# Patient Record
Sex: Female | Born: 1960
Health system: Southern US, Community
[De-identification: ages and names within clinical notes are randomized; demographics above are authoritative.]

## PROBLEM LIST (undated history)

## (undated) DIAGNOSIS — K259 Gastric ulcer, unspecified as acute or chronic, without hemorrhage or perforation: Secondary | ICD-10-CM

## (undated) DIAGNOSIS — F5104 Psychophysiologic insomnia: Secondary | ICD-10-CM

## (undated) DIAGNOSIS — G459 Transient cerebral ischemic attack, unspecified: Secondary | ICD-10-CM

## (undated) DIAGNOSIS — M797 Fibromyalgia: Secondary | ICD-10-CM

## (undated) DIAGNOSIS — R519 Headache, unspecified: Secondary | ICD-10-CM

## (undated) DIAGNOSIS — R251 Tremor, unspecified: Secondary | ICD-10-CM

## (undated) DIAGNOSIS — J45909 Unspecified asthma, uncomplicated: Secondary | ICD-10-CM

## (undated) DIAGNOSIS — I10 Essential (primary) hypertension: Secondary | ICD-10-CM

## (undated) DIAGNOSIS — E538 Deficiency of other specified B group vitamins: Secondary | ICD-10-CM

## (undated) DIAGNOSIS — D649 Anemia, unspecified: Secondary | ICD-10-CM

## (undated) DIAGNOSIS — J449 Chronic obstructive pulmonary disease, unspecified: Secondary | ICD-10-CM

## (undated) DIAGNOSIS — R413 Other amnesia: Secondary | ICD-10-CM

## (undated) DIAGNOSIS — J189 Pneumonia, unspecified organism: Secondary | ICD-10-CM

## (undated) DIAGNOSIS — R569 Unspecified convulsions: Secondary | ICD-10-CM

## (undated) DIAGNOSIS — F513 Sleepwalking [somnambulism]: Secondary | ICD-10-CM

## (undated) DIAGNOSIS — K219 Gastro-esophageal reflux disease without esophagitis: Secondary | ICD-10-CM

## (undated) DIAGNOSIS — Z87442 Personal history of urinary calculi: Secondary | ICD-10-CM

## (undated) DIAGNOSIS — I671 Cerebral aneurysm, nonruptured: Secondary | ICD-10-CM

## (undated) DIAGNOSIS — R269 Unspecified abnormalities of gait and mobility: Secondary | ICD-10-CM

## (undated) DIAGNOSIS — C539 Malignant neoplasm of cervix uteri, unspecified: Secondary | ICD-10-CM

## (undated) DIAGNOSIS — R51 Headache: Secondary | ICD-10-CM

## (undated) HISTORY — DX: Unspecified abnormalities of gait and mobility: R26.9

## (undated) HISTORY — DX: Gastro-esophageal reflux disease without esophagitis: K21.9

## (undated) HISTORY — DX: Malignant neoplasm of cervix uteri, unspecified: C53.9

## (undated) HISTORY — DX: Gastric ulcer, unspecified as acute or chronic, without hemorrhage or perforation: K25.9

## (undated) HISTORY — DX: Psychophysiologic insomnia: F51.04

## (undated) HISTORY — DX: Fibromyalgia: M79.7

## (undated) HISTORY — PX: COLONOSCOPY: SHX174

## (undated) HISTORY — PX: APPENDECTOMY: SHX54

## (undated) HISTORY — PX: SALPINGOOPHORECTOMY: SHX82

## (undated) HISTORY — PX: CHOLECYSTECTOMY: SHX55

## (undated) HISTORY — DX: Deficiency of other specified B group vitamins: E53.8

## (undated) HISTORY — PX: TOTAL ABDOMINAL HYSTERECTOMY: SHX209

## (undated) HISTORY — DX: Sleepwalking (somnambulism): F51.3

## (undated) HISTORY — DX: Cerebral aneurysm, nonruptured: I67.1

## (undated) HISTORY — DX: Pneumonia, unspecified organism: J18.9

## (undated) HISTORY — DX: Other amnesia: R41.3

## (undated) HISTORY — DX: Chronic obstructive pulmonary disease, unspecified: J44.9

---

## 2000-10-27 ENCOUNTER — Inpatient Hospital Stay (HOSPITAL_COMMUNITY): Admission: AD | Admit: 2000-10-27 | Discharge: 2000-10-30 | Payer: Self-pay | Admitting: Internal Medicine

## 2000-10-28 ENCOUNTER — Encounter: Payer: Self-pay | Admitting: Internal Medicine

## 2000-10-29 ENCOUNTER — Encounter: Payer: Self-pay | Admitting: Cardiology

## 2000-10-29 ENCOUNTER — Encounter: Payer: Self-pay | Admitting: Internal Medicine

## 2000-10-30 ENCOUNTER — Encounter: Payer: Self-pay | Admitting: Internal Medicine

## 2013-11-08 ENCOUNTER — Other Ambulatory Visit (HOSPITAL_COMMUNITY): Payer: Self-pay | Admitting: Unknown Physician Specialty

## 2013-11-09 ENCOUNTER — Other Ambulatory Visit (HOSPITAL_COMMUNITY): Payer: Self-pay | Admitting: Unknown Physician Specialty

## 2013-11-09 DIAGNOSIS — F17200 Nicotine dependence, unspecified, uncomplicated: Secondary | ICD-10-CM

## 2013-11-18 ENCOUNTER — Telehealth: Payer: Self-pay | Admitting: *Deleted

## 2013-11-18 ENCOUNTER — Ambulatory Visit (HOSPITAL_COMMUNITY)
Admission: RE | Admit: 2013-11-18 | Discharge: 2013-11-18 | Disposition: A | Discharge status: Home / Self Care | Payer: BC Managed Care – PPO | Source: Ambulatory Visit | Attending: Unknown Physician Specialty | Admitting: Unknown Physician Specialty

## 2013-11-18 DIAGNOSIS — Z87891 Personal history of nicotine dependence: Secondary | ICD-10-CM | POA: Insufficient documentation

## 2013-11-18 DIAGNOSIS — R05 Cough: Secondary | ICD-10-CM | POA: Insufficient documentation

## 2013-11-18 DIAGNOSIS — I2510 Atherosclerotic heart disease of native coronary artery without angina pectoris: Secondary | ICD-10-CM | POA: Insufficient documentation

## 2013-11-18 DIAGNOSIS — R634 Abnormal weight loss: Secondary | ICD-10-CM | POA: Insufficient documentation

## 2013-11-18 DIAGNOSIS — R059 Cough, unspecified: Secondary | ICD-10-CM | POA: Insufficient documentation

## 2013-11-18 DIAGNOSIS — M549 Dorsalgia, unspecified: Secondary | ICD-10-CM | POA: Insufficient documentation

## 2013-11-18 DIAGNOSIS — F17200 Nicotine dependence, unspecified, uncomplicated: Secondary | ICD-10-CM

## 2013-11-18 NOTE — Telephone Encounter (Signed)
Left message for pt to return my call so I can schedule a genetic appt.  

## 2013-11-22 ENCOUNTER — Telehealth: Payer: Self-pay | Admitting: *Deleted

## 2013-11-22 NOTE — Telephone Encounter (Signed)
Called and spoke with patient and confirmed genetic counseling appointment for 01/14/14 at 1230. Contacted Duwayne Heck at [facility] to make her aware that the appointment has been scheduled.

## 2014-01-14 ENCOUNTER — Other Ambulatory Visit: Payer: BC Managed Care – PPO

## 2014-01-14 ENCOUNTER — Ambulatory Visit (HOSPITAL_BASED_OUTPATIENT_CLINIC_OR_DEPARTMENT_OTHER): Payer: BC Managed Care – PPO | Admitting: Genetic Counselor

## 2014-01-14 DIAGNOSIS — Z800 Family history of malignant neoplasm of digestive organs: Secondary | ICD-10-CM

## 2014-01-14 DIAGNOSIS — Z803 Family history of malignant neoplasm of breast: Secondary | ICD-10-CM

## 2014-01-14 DIAGNOSIS — C539 Malignant neoplasm of cervix uteri, unspecified: Secondary | ICD-10-CM

## 2014-01-14 NOTE — Progress Notes (Signed)
HISTORY OF PRESENT ILLNESS: Dr. Sherrie Sport requested a cancer genetics consultation for Brandy Gutierrez, a 53 y.o. female, due to a personal and family history of cancer.  Brandy Gutierrez presents to clinic today to discuss the possibility of a hereditary predisposition to cancer, genetic testing, and to further clarify her future cancer risks, as well as potential cancer risk for family members.   Past Medical History  Diagnosis Date   Cervical cancer     dx at age 3    Past Surgical History  Procedure Laterality Date   Total abdominal hysterectomy      age 30 for cervical cancer   Salpingoophorectomy      age 35 for cervical cancer    HORMONAL RISK FACTORS: First live birth at age 69  OCP use: approximately 10 years HRT use: approximately >20 years Colonoscopy: yes; no polyps per patient UTD mammogram: yes Number of breast biopsies: 1 UTD pelvic exam:  yes    FAMILY HISTORY:  During the visit, a 4-generation pedigree was obtained. Significant diagnoses include the following:  Family History  Problem Relation Age of Onset   Cancer Mother 74    breast   Cancer Maternal Aunt 35    breast   Cancer Maternal Uncle 41    colon   Cancer Maternal Grandmother 48    breast   Cancer Maternal Grandfather 65    throat - smoker   Cancer Maternal Aunt 40    unknown type of cancer   Cancer Maternal Uncle 68    GI cancer (? pancreatic)   Cancer Maternal Aunt 55    breast   Cancer Cousin 22    breast   Cancer Cousin 20    breast    Brandy Gutierrez's ancestry is of Caucasian descent. There is no known Jewish ancestry or consanguinity.  GENETIC COUNSELING ASSESSMENT: Brandy Gutierrez is a 53 y.o. female with a family history of cancer suggestive of a hereditary predisposition to cancer. We, therefore, discussed and recommended the following at today's visit.   DISCUSSION: We reviewed the characteristics, features and inheritance patterns of hereditary cancer syndromes. We  also discussed genetic testing, including the appropriate family members to test, the process of testing, insurance coverage and turn-around-time for results. We discussed the implications of a negative, positive and/or variant of uncertain significant result. We recommended Ms. Crisanti pursue genetic testing for the BreastNext gene panel.   PLAN: Based on our above recommendation, Brandy Gutierrez wished to pursue genetic testing and the blood sample was drawn and will be sent to [facility] for analysis of the BreastNext gene panel. Results should be available within approximately 6 weeks time, at which point they will be disclosed by telephone to [name], as will any additional recommendations warranted by these results. We encouraged Brandy Gutierrez to remain in contact with cancer genetics annually so that we can continuously update the family history and inform her of any changes in cancer genetics and testing that may be of benefit for this family. Ms.  Gutierrez questions were answered to her satisfaction today. Our contact information was provided should additional questions or concerns arise.   Thank you for the referral and allowing [place] to share in the care of your patient.   The patient was seen for a total of 40 minutes, greater than 50% of which was spent face-to-face counseling.  This patient was discussed with Magrinat who agrees with the above.    _______________________________________________________________________ For Office Staff:  Number  of people involved in session: 2 Was an Intern/ student involved with case: no

## 2014-02-10 ENCOUNTER — Encounter: Payer: Self-pay | Admitting: Genetic Counselor

## 2014-02-10 DIAGNOSIS — Z803 Family history of malignant neoplasm of breast: Secondary | ICD-10-CM

## 2014-02-10 DIAGNOSIS — Z800 Family history of malignant neoplasm of digestive organs: Secondary | ICD-10-CM

## 2014-02-10 NOTE — Progress Notes (Signed)
HPI:  [name] was previously seen in the [place] clinic due to a family history of cancer and concerns regarding a hereditary predisposition to cancer. Please refer to our prior cancer genetics clinic note for more information regarding [name]'s medical, social and family histories, and our assessment and recommendations, at the time. [name] recent genetic test results were disclosed to her, as were recommendations warranted by these results. These results and recommendations are discussed in more detail below.  GENETIC TEST RESULTS: At the time of [name]'s visit, we recommended she pursue genetic testing of the BreastNext gene panel. This test, which included sequencing and deletion/duplication analysis of the genes listed on the test report, was performed at [facility]. Genetic testing was normal, and did not reveal a mutation in these genes. A complete list of all genes tested is located on the test report scanned into [employer].    We discussed with [name] that since the current genetic testing is not perfect, it is possible there may be a gene mutation in one of these genes that current testing cannot detect, but that chance is small.  We also discussed, that it is possible that another gene that has not yet been discovered, or that we have not yet tested, is responsible for the cancer diagnoses in the family, and it is, therefore, important to remain in touch with cancer genetics in the future so that we can continue to offer [name] the most up to date genetic testing.   CANCER SCREENING RECOMMENDATIONS: This normal result is reassuring and indicates that [name] does not likely have an increased risk of cancer due to a a mutation in one of these genes.  We, therefore, recommended  [name] continue to follow the cancer screening guidelines provided by her primary healthcare providers.   RECOMMENDATIONS FOR FAMILY  MEMBERS:   While these results are reassuring for [name], this test does not tell us anything about [name]'s siblings or maternal relatives risks. We recommended these relatives also have genetic counseling and testing. Please let us know if we can help facilitate testing. Genetic counselors can be located in other cities, by visiting the website of the [employer] (ArtistMovie.se) and Field seismologist for a Dietitian by zip code.    FOLLOW-UP: Lastly, we discussed with [name] that cancer genetics is a rapidly advancing field and it is possible that new genetic tests will be appropriate for her and/or her family members in the future. We encouraged her to remain in contact with cancer genetics on an annual basis so we can update her personal and family histories and let her know of advances in cancer genetics that may benefit this family.   Our contact number was provided. [name] questions were answered to her satisfaction, and she knows she is welcome to call us at anytime with additional questions or concerns. This patient was discussed with Dr. Jana Hakim who agrees with the above.   Catherine A. Fine, MS, CGC Certified Genetic Counseor phone: [phone] [email or web address]

## 2014-04-07 ENCOUNTER — Encounter: Payer: Self-pay | Admitting: Neurology

## 2014-04-08 ENCOUNTER — Encounter: Payer: Self-pay | Admitting: Neurology

## 2014-04-08 ENCOUNTER — Ambulatory Visit (INDEPENDENT_AMBULATORY_CARE_PROVIDER_SITE_OTHER): Payer: BC Managed Care – PPO | Admitting: Neurology

## 2014-04-08 VITALS — BP 136/91 | HR 85 | Ht 64.5 in | Wt 103.0 lb

## 2014-04-08 DIAGNOSIS — G63 Polyneuropathy in diseases classified elsewhere: Secondary | ICD-10-CM

## 2014-04-08 DIAGNOSIS — IMO0001 Reserved for inherently not codable concepts without codable children: Secondary | ICD-10-CM

## 2014-04-08 DIAGNOSIS — M797 Fibromyalgia: Secondary | ICD-10-CM

## 2014-04-08 DIAGNOSIS — R413 Other amnesia: Secondary | ICD-10-CM

## 2014-04-08 HISTORY — DX: Other amnesia: R41.3

## 2014-04-08 MED ORDER — SUVOREXANT 15 MG PO TABS: 15.0000 mg | ORAL_TABLET | Freq: Every evening | ORAL | Status: DC | PRN

## 2014-04-08 NOTE — Progress Notes (Signed)
Reason for visit: Memory disorder  Brandy Gutierrez is a 53 y.o. female  History of present illness:  Brandy Gutierrez is a 53 year old right-handed white female with a history of fibromyalgia. The patient indicates that she has ongoing chronic pain, and she has had problems with chronic insomnia associated with the pain. The patient indicates that she only gets 2 or 3 hours of sleep at night. The patient has had a parallel between her cognitive functioning abilities and her fatigue. The patient reports that her problems with memory and concentration have declined over the last 2 or 3 months, and the level of fatigue has significantly increased. The patient indicates that she is having problems with functioning at work, and she works as a Radiation protection practitioner, and she has had problems managing the money. The patient also has noted a 2 or 3 year history of numbness and tingling sensations and burning sensations in the feet. She has been noted to have a vitamin B12 deficiencies, and she has been on B12 injections. The numbness sensations in the feet have worsened, however over the last several months. She has noted some changes in balance, with staggering. The patient denies any falls. A recent MRI of the brain shows mild small vessel disease in the left greater than right deep white matter. The patient is on low-dose aspirin, but she continues to smoke. The patient does report some ongoing problems with constipation, but no problems controlling the bowels or the bladder. The patient indicates that she did undergo a carotid Doppler study, and her impression was that this was unremarkable. She is sent to this office for an evaluation. The patient indicates that at nighttime she will oftentimes walk in her sleep.  Past Medical History  Diagnosis Date  . Cervical cancer     dx at age 44  . Fibromyalgia   . Memory disorder 04/08/2014  . Vitamin B12 deficiency   . Somnambulism   . Chronic insomnia     Past  Surgical History  Procedure Laterality Date  . Total abdominal hysterectomy      age 89 for cervical cancer  . Salpingoophorectomy      age 33 for cervical cancer    Family History  Problem Relation Age of Onset  . Cancer Mother 104    breast  . Cancer Maternal Aunt 70    breast  . Cancer Maternal Uncle 45    colon  . Cancer Maternal Grandmother 46    breast  . Stroke Maternal Grandmother   . Cancer Maternal Grandfather 65    throat - smoker  . Cancer Maternal Aunt 40    unknown type of cancer  . Cancer Maternal Uncle 68    GI cancer (? pancreatic)  . Cancer Maternal Aunt 38    breast  . Cancer Cousin 22    breast  . Cancer Cousin 69    breast  . Cancer Father   . Hypertension Sister   . Hypertension Brother     Social history:  reports that she has been smoking Cigarettes.  She has a 28 pack-year smoking history. She has never used smokeless tobacco. She reports that she drinks alcohol. She reports that she does not use illicit drugs.  Medications:  Current Outpatient Prescriptions on File Prior to Visit  Medication Sig Dispense Refill  . ALPRAZolam (XANAX) 0.5 MG tablet Take 0.5 mg by mouth 2 (two) times daily as needed.       Marland Kitchen aspirin 81 MG  tablet Take 81 mg by mouth daily.      . cyanocobalamin (,VITAMIN B-12,) 1000 MCG/ML injection Inject 1,000 mcg into the muscle every 30 (thirty) days.      . fentaNYL (DURAGESIC - DOSED MCG/HR) 50 MCG/HR Place 50 mcg onto the skin every 3 (three) days.      . fexofenadine (ALLEGRA ALLERGY) 180 MG tablet Take 180 mg by mouth daily.      Marland Kitchen gabapentin (NEURONTIN) 600 MG tablet Take 600 mg by mouth 4 (four) times daily.      . hydrochlorothiazide (HYDRODIURIL) 25 MG tablet Take 25 mg by mouth daily.      Marland Kitchen oxyCODONE-acetaminophen (PERCOCET) 7.5-325 MG per tablet Take 1 tablet by mouth every 8 (eight) hours as needed for pain.       No current facility-administered medications on file prior to visit.      Allergies  Allergen  Reactions  . Sulfa Antibiotics     ROS:  Out of a complete 14 system review of symptoms, the patient complains only of the following symptoms, and all other reviewed systems are negative.  Fevers, chills Blurred vision Constipation Joint pain, joint swelling, muscle cramps, achy muscles Confusion, headache, numbness, weakness, dizziness Insomnia, decreased energy, disinterest in activities, sleepwalking  Blood pressure 136/91, pulse 85, height 5' 4.5" (1.638 m), weight 103 lb (46.72 kg).  Physical Exam  General: The patient is alert and cooperative at the time of the examination.  Eyes: Pupils are anisocoric, the right pupil is 5 mm, left pupil 4 mm, both reactive to light. No ptosis is seen. Discs are flat bilaterally.  Neck: The neck is supple, no carotid bruits are noted.  Respiratory: The respiratory examination is clear.  Cardiovascular: The cardiovascular examination reveals a regular rate and rhythm, no obvious murmurs or rubs are noted.  Skin: Extremities are without significant edema.  Neurologic Exam  Mental status: The patient is alert and oriented x 3 at the time of the examination. The patient has apparent normal recent and remote memory, with an apparently normal attention span and concentration ability. Mini-Mental status examination done today shows a total score of 30/30.  Cranial nerves: Facial symmetry is present. There is good sensation of the face to pinprick and soft touch bilaterally. The strength of the facial muscles and the muscles to head turning and shoulder shrug are normal bilaterally. Speech is well enunciated, no aphasia or dysarthria is noted. Extraocular movements are full. Visual fields are full. The tongue is midline, and the patient has symmetric elevation of the soft palate. No obvious hearing deficits are noted.  Motor: The motor testing reveals 5 over 5 strength of all 4 extremities. Good symmetric motor tone is noted  throughout.  Sensory: Sensory testing is intact to pinprick, soft touch, vibration sensation, and position sense on the upper extremities. With the lower extremities, there is a stocking pattern pinprick sensory deficit up to the knees bilaterally, with moderate impairment of vibration sensation and mild impairment of position sensation in both feet. No evidence of extinction is noted.  Coordination: Cerebellar testing reveals good finger-nose-finger and heel-to-shin bilaterally.  Gait and station: Gait is normal. Tandem gait is normal. Romberg is negative. No drift is seen.  Reflexes: Deep tendon reflexes are symmetric and normal bilaterally, with the exception that there is a question of ankle jerk reflexes bilaterally. Toes are downgoing bilaterally.   Assessment/Plan:  1. Fibromyalgia  2. Chronic insomnia  3. Vitamin B12 deficiency  4. Probable peripheral neuropathy  5. Reported memory disturbance  6. Mild small vessel disease by MRI brain  The patient is to remain on low-dose aspirin, and I have suggested that she stop smoking. She is having significant issues with pain, and insomnia. This may be the primary etiology of her memory issues, as the fatigue and memory problems and concentration issues have paralleled one another. The patient will be placed on Belsomra for sleep, and she will undergo nerve conduction studies of both legs and one arm, EMG of 1 leg for an evaluation of a peripheral neuropathy. Blood work will be done today. She will followup for the EMG evaluation. The memory issue will need to be followed over time, and if this continues to progress, medications for memory may be added. Adequate treatment of her fibromyalgia pain may be important, and the use of Cymbalta in the future may be considered.   Jill Alexanders MD 04/08/2014 7:46 PM  [facility] [address] [place], [place] [address]  Phone [phone] Fax [phone]

## 2014-04-08 NOTE — Patient Instructions (Signed)
Fibromyalgia Fibromyalgia is a disorder that is often misunderstood. It is associated with muscular pains and tenderness that comes and goes. It is often associated with fatigue and sleep disturbances. Though it tends to be long-lasting, fibromyalgia is not life-threatening. CAUSES  The exact cause of fibromyalgia is unknown. People with certain gene types are predisposed to developing fibromyalgia and other conditions. Certain factors can play a role as triggers, such as:  Spine disorders.  Arthritis.  Severe injury (trauma) and other physical stressors.  Emotional stressors. SYMPTOMS   The main symptom is pain and stiffness in the muscles and joints, which can vary over time.  Sleep and fatigue problems. Other related symptoms may include:  Bowel and bladder problems.  Headaches.  Visual problems.  Problems with odors and noises.  Depression or mood changes.  Painful periods (dysmenorrhea).  Dryness of the skin or eyes. DIAGNOSIS  There are no specific tests for diagnosing fibromyalgia. Patients can be diagnosed accurately from the specific symptoms they have. The diagnosis is made by determining that nothing else is causing the problems. TREATMENT  There is no cure. Management includes medicines and an active, healthy lifestyle. The goal is to enhance physical fitness, decrease pain, and improve sleep. HOME CARE INSTRUCTIONS   Only take over-the-counter or prescription medicines as directed by your caregiver. Sleeping pills, tranquilizers, and pain medicines may make your problems worse.  Low-impact aerobic exercise is very important and advised for treatment. At first, it may seem to make pain worse. Gradually increasing your tolerance will overcome this feeling.  Learning relaxation techniques and how to control stress will help you. Biofeedback, visual imagery, hypnosis, muscle relaxation, yoga, and meditation are all options.  Anti-inflammatory medicines and  physical therapy may provide short-term help.  Acupuncture or massage treatments may help.  Take muscle relaxant medicines as suggested by your caregiver.  Avoid stressful situations.  Plan a healthy lifestyle. This includes your diet, sleep, rest, exercise, and friends.  Find and practice a hobby you enjoy.  Join a fibromyalgia support group for interaction, ideas, and sharing advice. This may be helpful. SEEK MEDICAL CARE IF:  You are not having good results or improvement from your treatment. FOR MORE INFORMATION  National Fibromyalgia Association: www.fmaware.org Arthritis Foundation: www.arthritis.org Document Released: 08/05/2005 Document Revised: 10/28/2011 Document Reviewed: 11/15/2009 ExitCare Patient Information 2015 [employer]. This information is not intended to replace advice given to you by your health care provider. Make sure you discuss any questions you have with your health care provider.  

## 2014-04-12 ENCOUNTER — Telehealth: Payer: Self-pay | Admitting: Neurology

## 2014-04-12 LAB — IFE AND PE, SERUM
ALBUMIN SERPL ELPH-MCNC: 4 g/dL (ref 3.2–5.6)
ALPHA 1: 0.3 g/dL (ref 0.1–0.4)
ALPHA2 GLOB SERPL ELPH-MCNC: 0.8 g/dL (ref 0.4–1.2)
Albumin/Glob SerPl: 1.3 (ref 0.7–2.0)
B-GLOBULIN SERPL ELPH-MCNC: 1 g/dL (ref 0.6–1.3)
Gamma Glob SerPl Elph-Mcnc: 1.2 g/dL (ref 0.5–1.6)
Globulin, Total: 3.3 g/dL (ref 2.0–4.5)
IgA/Immunoglobulin A, Serum: 241 mg/dL (ref 91–414)
IgG (Immunoglobin G), Serum: 978 mg/dL (ref 700–1600)
IgM (Immunoglobulin M), Srm: 197 mg/dL (ref 40–230)
TOTAL PROTEIN: 7.3 g/dL (ref 6.0–8.5)

## 2014-04-12 LAB — RHEUMATOID FACTOR: Rhuematoid fact SerPl-aCnc: 7.9 IU/mL (ref 0.0–13.9)

## 2014-04-12 LAB — ENA+DNA/DS+SJORGEN'S
DSDNA AB: 2 [IU]/mL (ref 0–9)
ENA RNP AB: 6.9 AI — AB (ref 0.0–0.9)
ENA SM Ab Ser-aCnc: <0.2 AI (ref 0.0–0.9)
ENA SSA (RO) Ab: <0.2 AI (ref 0.0–0.9)

## 2014-04-12 LAB — SEDIMENTATION RATE: Sed Rate: 2 mm/hr (ref 0–40)

## 2014-04-12 LAB — HIV ANTIBODY (ROUTINE TESTING W REFLEX): HIV 1/HIV 2 AB: NONREACTIVE

## 2014-04-12 LAB — ANA W/REFLEX: Anti Nuclear Antibody(ANA): POSITIVE — AB

## 2014-04-12 LAB — ANGIOTENSIN CONVERTING ENZYME: ANGIO CONVERT ENZYME: 39 U/L (ref 14–82)

## 2014-04-12 LAB — RPR: SYPHILIS RPR SCR: NONREACTIVE

## 2014-04-12 LAB — COPPER, SERUM: COPPER: 138 ug/dL (ref 72–166)

## 2014-04-12 NOTE — Telephone Encounter (Signed)
I called patient. The blood work was unremarkable with exception of a positive ANA with an elevated RNP antibody. Unsure the clinical significance of this. May need to be followed in the future. I discussed this with the patient.

## 2014-05-02 ENCOUNTER — Encounter (INDEPENDENT_AMBULATORY_CARE_PROVIDER_SITE_OTHER): Payer: Self-pay

## 2014-05-02 ENCOUNTER — Ambulatory Visit (INDEPENDENT_AMBULATORY_CARE_PROVIDER_SITE_OTHER): Payer: BC Managed Care – PPO | Admitting: Neurology

## 2014-05-02 DIAGNOSIS — M797 Fibromyalgia: Secondary | ICD-10-CM

## 2014-05-02 DIAGNOSIS — Z0289 Encounter for other administrative examinations: Secondary | ICD-10-CM

## 2014-05-02 DIAGNOSIS — R413 Other amnesia: Secondary | ICD-10-CM

## 2014-05-02 DIAGNOSIS — IMO0001 Reserved for inherently not codable concepts without codable children: Secondary | ICD-10-CM

## 2014-05-02 DIAGNOSIS — G63 Polyneuropathy in diseases classified elsewhere: Secondary | ICD-10-CM

## 2014-05-02 DIAGNOSIS — R209 Unspecified disturbances of skin sensation: Secondary | ICD-10-CM

## 2014-05-02 MED ORDER — DULOXETINE HCL 30 MG PO CPEP: ORAL_CAPSULE | ORAL | Status: DC

## 2014-05-02 NOTE — Progress Notes (Signed)
Brandy Gutierrez is a 53 year old patient with a history of chronic low back pain, and some sensory dysesthesias in both feet, and more recently involving the left arm. The patient has some difficulty with sleeping at night secondary to the dysesthesias, and she is reporting some myoclonic jerks of the legs at night. She returns to the office for EMG and nerve conduction study evaluation.   Nerve conduction studies of the left arm and both legs were unremarkable. No definite peripheral he is seen. EMG evaluation of the right leg was normal.  The patient is having sensory dysesthesias on the left arm and both legs. MRI evaluation of the brain done previously showed minimal small vessel disease. She will have MRI of the cervical spine to exclude demyelinating disease as a source of her sensory symptoms. Blood work showed a positive RNP antibody, which will need to be followed.  The patient will be placed on Cymbalta, and she will followup in 3-4 months. MRI of the cervical spine will be done.

## 2014-05-02 NOTE — Procedures (Signed)
     HISTORY:  Brandy Gutierrez is a 53 year old patient with a history of paresthesias involving the feet, and more recently involving the left hand. The patient has chronic low back pain issues as well. She is being evaluated for possible neuropathy or a radiculopathy.  NERVE CONDUCTION STUDIES:  Nerve conduction studies were performed on the left upper extremity. The distal motor latencies and motor amplitudes for the median and ulnar nerves were within normal limits. The F wave latencies and nerve conduction velocities for these nerves were also normal. The sensory latencies for the median and ulnar nerves were normal.  Nerve conduction studies were performed on both lower extremities. The distal motor latencies and motor amplitudes for the peroneal and posterior tibial nerves were within normal limits. The nerve conduction velocities for these nerves were also normal. The H reflex latencies were normal. The sensory latencies for the peroneal nerves were within normal limits.   EMG STUDIES:  EMG study was performed on the right lower extremity:  The tibialis anterior muscle reveals 2 to 4K motor units with full recruitment. No fibrillations or positive waves were seen. The peroneus tertius muscle reveals 2 to 4K motor units with full recruitment. No fibrillations or positive waves were seen. The medial gastrocnemius muscle reveals 1 to 3K motor units with full recruitment. No fibrillations or positive waves were seen. The vastus lateralis muscle reveals 2 to 4K motor units with full recruitment. No fibrillations or positive waves were seen. The iliopsoas muscle reveals 2 to 4K motor units with full recruitment. No fibrillations or positive waves were seen. The biceps femoris muscle (long head) reveals 2 to 4K motor units with full recruitment. No fibrillations or positive waves were seen. The lumbosacral paraspinal muscles were tested at 3 levels, and revealed no abnormalities of  insertional activity at all 3 levels tested. There was good relaxation.   IMPRESSION:  Nerve conduction studies done on the left upper extremity and both lower extremities were within normal limits. No evidence of a peripheral neuropathy is seen. A small fiber neuropathy however, may be missed by a standard nerve conduction studies. Clinical correlation is required. EMG evaluation of the right lower extremity was unremarkable, without evidence of an overlying lumbosacral radiculopathy.  Jill Alexanders MD 05/02/2014 11:06 AM  [facility] [address] [place], [place] [address]  Phone [phone] Fax [phone]

## 2014-05-11 ENCOUNTER — Telehealth: Payer: Self-pay | Admitting: *Deleted

## 2014-05-11 ENCOUNTER — Ambulatory Visit (INDEPENDENT_AMBULATORY_CARE_PROVIDER_SITE_OTHER): Payer: BC Managed Care – PPO

## 2014-05-11 DIAGNOSIS — G63 Polyneuropathy in diseases classified elsewhere: Secondary | ICD-10-CM

## 2014-05-11 DIAGNOSIS — R413 Other amnesia: Secondary | ICD-10-CM

## 2014-05-11 DIAGNOSIS — R209 Unspecified disturbances of skin sensation: Secondary | ICD-10-CM

## 2014-05-11 DIAGNOSIS — IMO0001 Reserved for inherently not codable concepts without codable children: Secondary | ICD-10-CM

## 2014-05-11 DIAGNOSIS — M797 Fibromyalgia: Secondary | ICD-10-CM

## 2014-05-11 NOTE — Telephone Encounter (Signed)
CD in [place] for reading 05/11/2014.

## 2014-05-12 ENCOUNTER — Telehealth: Payer: Self-pay | Admitting: Neurology

## 2014-05-12 NOTE — Telephone Encounter (Signed)
I called patient. MRI the cervical spine shows some facet joint arthritis at the C6-7 level, no evidence of spinal cord lesions, no evidence of multiple sclerosis. I discussed this with the patient.   MRI cervical spine 05/12/2014:  Impression   Mildly abnormal MRI cervical spine (without) demonstrating: 1. At C6-7: uncovertebral joint hypertrophy and facet hypertrophy with  mild right foraminal stenosis. 2. No intrinsic or compressive spinal cord lesions.

## 2014-07-29 ENCOUNTER — Encounter: Payer: Self-pay | Admitting: Neurology

## 2014-07-29 ENCOUNTER — Ambulatory Visit (INDEPENDENT_AMBULATORY_CARE_PROVIDER_SITE_OTHER): Payer: BC Managed Care – PPO | Admitting: Neurology

## 2014-07-29 VITALS — BP 109/80 | HR 82 | Ht 66.0 in | Wt 102.6 lb

## 2014-07-29 DIAGNOSIS — R251 Tremor, unspecified: Secondary | ICD-10-CM

## 2014-07-29 DIAGNOSIS — M797 Fibromyalgia: Secondary | ICD-10-CM

## 2014-07-29 MED ORDER — DULOXETINE HCL 30 MG PO CPEP: ORAL_CAPSULE | ORAL | Status: DC

## 2014-07-29 MED ORDER — LANSOPRAZOLE 30 MG PO CPDR: 30.0000 mg | DELAYED_RELEASE_CAPSULE | Freq: Every day | ORAL | Status: DC

## 2014-07-29 NOTE — Progress Notes (Signed)
Reason for visit: Fibromyalgia  Brandy Gutierrez is an 53 y.o. female  History of present illness:  Brandy Gutierrez is a 53 year old right-handed white female with a history of fibromyalgia. The patient has been placed on Cymbalta with some modest benefit. She is tolerating the medication fairly well. She went in the hospital 3 weeks ago for pneumonia, and she required IV antibodies for 4 days in the hospital. The patient has begun having episodes of generalized tremors involving the arms and legs and head and neck that may come on spontaneously. The episodes may last 3-10 minutes, and the patient indicates that she cannot control the tremors. The tremors only occur while she is standing, or while sitting or lying down. She reports no episodes of syncope. She does not feel lightheaded, or anxious with the episodes. She denies any fevers or chills around the time of the episodes of tremor. She recently had a migraine headache. She may have some slight confusion. The patient has also recently quit smoking, and she is on E-cigarettes. The patient comes to this office for an evaluation.   Past Medical History  Diagnosis Date  . Cervical cancer     dx at age 14  . Fibromyalgia   . Memory disorder 04/08/2014  . Vitamin B12 deficiency   . Somnambulism   . Chronic insomnia   . Pneumonia   . COPD (chronic obstructive pulmonary disease)   . GERD (gastroesophageal reflux disease)     Past Surgical History  Procedure Laterality Date  . Total abdominal hysterectomy      age 26 for cervical cancer  . Salpingoophorectomy      age 35 for cervical cancer    Family History  Problem Relation Age of Onset  . Cancer Mother 27    breast  . Cancer Maternal Aunt 28    breast  . Cancer Maternal Uncle 58    colon  . Cancer Maternal Grandmother 51    breast  . Stroke Maternal Grandmother   . Cancer Maternal Grandfather 65    throat - smoker  . Cancer Maternal Aunt 40    unknown type of cancer    . Cancer Maternal Uncle 68    GI cancer (? pancreatic)  . Cancer Maternal Aunt 29    breast  . Cancer Cousin 22    breast  . Cancer Cousin 78    breast  . Cancer Father   . Hypertension Sister   . Hypertension Brother     Social history:  reports that she has been smoking Cigarettes.  She has a 28 pack-year smoking history. She has never used smokeless tobacco. She reports that she drinks alcohol. She reports that she does not use illicit drugs.    Allergies  Allergen Reactions  . Sulfa Antibiotics     Medications:  Current Outpatient Prescriptions on File Prior to Visit  Medication Sig Dispense Refill  . ALPRAZolam (XANAX) 0.5 MG tablet Take 0.5 mg by mouth 2 (two) times daily as needed.     Marland Kitchen aspirin 81 MG tablet Take 81 mg by mouth daily.    . cyanocobalamin (,VITAMIN B-12,) 1000 MCG/ML injection Inject 1,000 mcg into the muscle every 30 (thirty) days.    . fentaNYL (DURAGESIC - DOSED MCG/HR) 50 MCG/HR Place 50 mcg onto the skin every 3 (three) days.    . fexofenadine (ALLEGRA ALLERGY) 180 MG tablet Take 180 mg by mouth daily.    . folic acid (FOLVITE) 1 MG  tablet Take 1 mg by mouth daily.    Marland Kitchen gabapentin (NEURONTIN) 600 MG tablet Take 600 mg by mouth 4 (four) times daily.    . hydrochlorothiazide (HYDRODIURIL) 25 MG tablet Take 25 mg by mouth daily.    Marland Kitchen oxyCODONE-acetaminophen (PERCOCET) 7.5-325 MG per tablet Take 1 tablet by mouth every 8 (eight) hours as needed for pain.     No current facility-administered medications on file prior to visit.    ROS:  Out of a complete 14 system review of symptoms, the patient complains only of the following symptoms, and all other reviewed systems are negative.  Activity change, appetite change, chills, fever Ear pain, ringing in the ears Blurred vision Cold intolerance Insomnia, sleepwalking Walking difficulty Dizziness, headache, tremors  Blood pressure 109/80, pulse 82, height 5\' 6"  (1.676 m), weight 102 lb 9.6 oz (46.539  kg).   Blood pressure, left arm, standing is 409 systolic.  Physical Exam  General: The patient is alert and cooperative at the time of the examination.  Skin: No significant peripheral edema is noted.   Neurologic Exam  Mental status: The patient is oriented x 3.  Cranial nerves: Facial symmetry is present. Speech is normal, no aphasia or dysarthria is noted. Extraocular movements are full. Visual fields are full.  Motor: The patient has good strength in all 4 extremities.  Sensory examination: Soft touch sensation is symmetric on the face, arms, and legs.  Coordination: The patient has good finger-nose-finger and heel-to-shin bilaterally.  Gait and station: The patient has a normal gait. Tandem gait is slightly unsteady. Romberg is negative. No drift is seen.  Reflexes: Deep tendon reflexes are symmetric.   Assessment/Plan:  1. Fibromyalgia  2. Intermittent tremors, etiology unclear  The patient has gained some benefit with Cymbalta with the fibromyalgia. The Cymbalta will be increased taking 30 mg the morning, 60 mg in the evening. The patient has begun having some episodes of tremors that have occurred following the hospitalization for pneumonia. The etiology of these events are not clear. The patient will be sent for further blood work today. Previous blood work showed a positive RNP antibody. The tremor episodes are not consistent with seizures as there is no loss of conscious with the events. She will follow-up in 4-5 months. There is no evidence of orthostasis on clinical examination today.  Jill Alexanders MD 07/29/2014 6:22 PM  [facility] [address] [place], [place] [address]  Phone [phone] Fax [phone]

## 2014-07-29 NOTE — Patient Instructions (Signed)
Fibromyalgia Fibromyalgia is a disorder that is often misunderstood. It is associated with muscular pains and tenderness that comes and goes. It is often associated with fatigue and sleep disturbances. Though it tends to be long-lasting, fibromyalgia is not life-threatening. CAUSES  The exact cause of fibromyalgia is unknown. People with certain gene types are predisposed to developing fibromyalgia and other conditions. Certain factors can play a role as triggers, such as:  Spine disorders.  Arthritis.  Severe injury (trauma) and other physical stressors.  Emotional stressors. SYMPTOMS   The main symptom is pain and stiffness in the muscles and joints, which can vary over time.  Sleep and fatigue problems. Other related symptoms may include:  Bowel and bladder problems.  Headaches.  Visual problems.  Problems with odors and noises.  Depression or mood changes.  Painful periods (dysmenorrhea).  Dryness of the skin or eyes. DIAGNOSIS  There are no specific tests for diagnosing fibromyalgia. Patients can be diagnosed accurately from the specific symptoms they have. The diagnosis is made by determining that nothing else is causing the problems. TREATMENT  There is no cure. Management includes medicines and an active, healthy lifestyle. The goal is to enhance physical fitness, decrease pain, and improve sleep. HOME CARE INSTRUCTIONS   Only take over-the-counter or prescription medicines as directed by your caregiver. Sleeping pills, tranquilizers, and pain medicines may make your problems worse.  Low-impact aerobic exercise is very important and advised for treatment. At first, it may seem to make pain worse. Gradually increasing your tolerance will overcome this feeling.  Learning relaxation techniques and how to control stress will help you. Biofeedback, visual imagery, hypnosis, muscle relaxation, yoga, and meditation are all options.  Anti-inflammatory medicines and  physical therapy may provide short-term help.  Acupuncture or massage treatments may help.  Take muscle relaxant medicines as suggested by your caregiver.  Avoid stressful situations.  Plan a healthy lifestyle. This includes your diet, sleep, rest, exercise, and friends.  Find and practice a hobby you enjoy.  Join a fibromyalgia support group for interaction, ideas, and sharing advice. This may be helpful. SEEK MEDICAL CARE IF:  You are not having good results or improvement from your treatment. FOR MORE INFORMATION  National Fibromyalgia Association: www.fmaware.org Arthritis Foundation: www.arthritis.org Document Released: 08/05/2005 Document Revised: 10/28/2011 Document Reviewed: 11/15/2009 ExitCare Patient Information 2015 [employer]. This information is not intended to replace advice given to you by your health care provider. Make sure you discuss any questions you have with your health care provider.  

## 2014-07-30 LAB — CBC WITH DIFFERENTIAL
BASOS ABS: 0 10*3/uL (ref 0.0–0.2)
Basos: 1 %
EOS: 3 %
Eosinophils Absolute: 0.1 10*3/uL (ref 0.0–0.4)
HCT: 44.2 % (ref 34.0–46.6)
Hemoglobin: 14.9 g/dL (ref 11.1–15.9)
IMMATURE GRANS (ABS): 0 10*3/uL (ref 0.0–0.1)
IMMATURE GRANULOCYTES: 0 %
Lymphocytes Absolute: 1.6 10*3/uL (ref 0.7–3.1)
Lymphs: 36 %
MCH: 31.8 pg (ref 26.6–33.0)
MCHC: 33.7 g/dL (ref 31.5–35.7)
MCV: 94 fL (ref 79–97)
MONOCYTES: 14 %
Monocytes Absolute: 0.6 10*3/uL (ref 0.1–0.9)
NEUTROS PCT: 46 %
Neutrophils Absolute: 2.1 10*3/uL (ref 1.4–7.0)
PLATELETS: 163 10*3/uL (ref 150–379)
RBC: 4.68 x10E6/uL (ref 3.77–5.28)
RDW: 14.3 % (ref 12.3–15.4)
WBC: 4.5 10*3/uL (ref 3.4–10.8)

## 2014-07-30 LAB — ANA W/REFLEX: Anti Nuclear Antibody(ANA): POSITIVE — AB

## 2014-07-30 LAB — COMPREHENSIVE METABOLIC PANEL
ALBUMIN: 4 g/dL (ref 3.5–5.5)
ALK PHOS: 57 IU/L (ref 39–117)
ALT: 17 IU/L (ref 0–32)
AST: 29 IU/L (ref 0–40)
Albumin/Globulin Ratio: 1.7 (ref 1.1–2.5)
BUN/Creatinine Ratio: 16 (ref 9–23)
BUN: 15 mg/dL (ref 6–24)
CO2: 26 mmol/L (ref 18–29)
Calcium: 9.7 mg/dL (ref 8.7–10.2)
Chloride: 99 mmol/L (ref 97–108)
Creatinine, Ser: 0.94 mg/dL (ref 0.57–1.00)
GFR calc Af Amer: 81 mL/min/{1.73_m2} (ref >59)
GFR calc non Af Amer: 70 mL/min/{1.73_m2} (ref >59)
Globulin, Total: 2.4 g/dL (ref 1.5–4.5)
Glucose: 76 mg/dL (ref 65–99)
Potassium: 4.1 mmol/L (ref 3.5–5.2)
SODIUM: 141 mmol/L (ref 134–144)
Total Bilirubin: <0.2 mg/dL (ref 0.0–1.2)
Total Protein: 6.4 g/dL (ref 6.0–8.5)

## 2014-07-30 LAB — ENA+DNA/DS+SJORGEN'S
ENA RNP AB: 6.2 AI — AB (ref 0.0–0.9)
ENA SM Ab Ser-aCnc: <0.2 AI (ref 0.0–0.9)
ENA SSA (RO) Ab: <0.2 AI (ref 0.0–0.9)
dsDNA Ab: 1 IU/mL (ref 0–9)

## 2014-07-30 LAB — TSH: TSH: 2.05 u[IU]/mL (ref 0.450–4.500)

## 2014-08-01 ENCOUNTER — Telehealth: Payer: Self-pay | Admitting: Neurology

## 2014-08-01 DIAGNOSIS — R251 Tremor, unspecified: Secondary | ICD-10-CM

## 2014-08-01 NOTE — Telephone Encounter (Signed)
I called patient. The patient still having episodes of tremors. I will check an EEG study. Blood work was relatively unremarkable.

## 2014-08-02 ENCOUNTER — Telehealth: Payer: Self-pay | Admitting: *Deleted

## 2014-08-02 NOTE — Telephone Encounter (Signed)
Message left requesting a call back to schedule the EEG Dr. Jannifer Franklin has requested.

## 2014-08-09 ENCOUNTER — Ambulatory Visit (INDEPENDENT_AMBULATORY_CARE_PROVIDER_SITE_OTHER): Payer: BC Managed Care – PPO | Admitting: Diagnostic Neuroimaging

## 2014-08-09 DIAGNOSIS — R251 Tremor, unspecified: Secondary | ICD-10-CM

## 2014-08-09 NOTE — Procedures (Signed)
   [facility]  EEG (ELECTROENCEPHALOGRAM) REPORT   STUDY DATE: 08/09/14  PATIENT NAME: Brandy Gutierrez DOB: 1960-09-09 MRN: [id number]  ORDERING CLINICIAN: Kathrynn Ducking, MD   TECHNOLOGIST: Laretta Alstrom  TECHNIQUE: Electroencephalogram was recorded utilizing standard 10-20 system of lead placement and reformatted into average and bipolar montages.  RECORDING TIME: 33 minutes  ACTIVATION: hyperventilation and photic stimulation  CLINICAL INFORMATION: 52 year old female with generalized tremors.   FINDINGS: Background rhythms of 7-8 hertz and 20-30 microvolts. No focal, lateralizing, epileptiform activity or seizures are seen. Patient recorded in the awake and drowsy state. EKG channel shows regular rhythm of 84 beats per minute.  IMPRESSION:  Mild diffuse slowly noted. Can be seen with toxic/metabolic etiologies or with drowsiness. No focal, lateralizing, epileptiform activity or seizures are seen.     INTERPRETING PHYSICIAN:  Penni Bombard, MD Certified in Neurology, Neurophysiology and Neuroimaging  [facility] [address] [place], [place] [address] [phone]

## 2014-08-13 ENCOUNTER — Telehealth: Payer: Self-pay | Admitting: Neurology

## 2014-08-13 MED ORDER — CLONAZEPAM 0.5 MG PO TABS: 0.5000 mg | ORAL_TABLET | Freq: Two times a day (BID) | ORAL | Status: DC

## 2014-08-13 NOTE — Telephone Encounter (Signed)
  I called the patient. She is still having intermittent tremors. The EEG showed mild slowing, no irritability. I will try low dose clonazepam, the patient will stop the alprazolam.  EEG 08/09/14:  IMPRESSION:  Mild diffuse slowly noted. Can be seen with toxic/metabolic  etiologies or with drowsiness. No focal, lateralizing,  epileptiform activity or seizures are seen.

## 2014-12-07 ENCOUNTER — Ambulatory Visit (INDEPENDENT_AMBULATORY_CARE_PROVIDER_SITE_OTHER): Payer: BLUE CROSS/BLUE SHIELD | Admitting: Neurology

## 2014-12-07 ENCOUNTER — Encounter: Payer: Self-pay | Admitting: Neurology

## 2014-12-07 VITALS — BP 138/93 | HR 106 | Ht 66.0 in | Wt 105.6 lb

## 2014-12-07 DIAGNOSIS — R413 Other amnesia: Secondary | ICD-10-CM | POA: Diagnosis not present

## 2014-12-07 DIAGNOSIS — M797 Fibromyalgia: Secondary | ICD-10-CM | POA: Diagnosis not present

## 2014-12-07 DIAGNOSIS — R258 Other abnormal involuntary movements: Secondary | ICD-10-CM | POA: Diagnosis not present

## 2014-12-07 DIAGNOSIS — R251 Tremor, unspecified: Secondary | ICD-10-CM

## 2014-12-07 MED ORDER — NORTRIPTYLINE HCL 10 MG PO CAPS: ORAL_CAPSULE | ORAL | Status: DC

## 2014-12-07 NOTE — Progress Notes (Signed)
Reason for visit: Fibromyalgia  Brandy Gutierrez is an 54 y.o. female  History of present illness:  Brandy Gutierrez is a 54 year old right-handed white female with a history of fibromyalgia. The patient is on a fentanyl patch for this, and she also takes Percocet tablets if she needs it. The patient indicates that the pain level was not fully controlled with these medications. She is also on gabapentin, and she was started on Cymbalta in low dose, but she could not tolerate the Cymbalta. At the 30 mg dose, the Cymbalta helped her sleep, but at 60 mg, she became confused. She stopped the medication altogether. She has tremors affecting the arms intermittently, she indicates that this happens on a daily basis. EEG study showed mild diffuse slowing that could be consistent with drowsiness. No seizures were seen. She returns for an evaluation.  Past Medical History  Diagnosis Date  . Cervical cancer     dx at age 75  . Fibromyalgia   . Memory disorder 04/08/2014  . Vitamin B12 deficiency   . Somnambulism   . Chronic insomnia   . Pneumonia   . COPD (chronic obstructive pulmonary disease)   . GERD (gastroesophageal reflux disease)   . Stomach ulcer     Past Surgical History  Procedure Laterality Date  . Total abdominal hysterectomy      age 89 for cervical cancer  . Salpingoophorectomy      age 54 for cervical cancer    Family History  Problem Relation Age of Onset  . Cancer Mother 52    breast  . Cancer Maternal Aunt 50    breast  . Cancer Maternal Uncle 69    colon  . Cancer Maternal Grandmother 89    breast  . Stroke Maternal Grandmother   . Cancer Maternal Grandfather 65    throat - smoker  . Cancer Maternal Aunt 40    unknown type of cancer  . Cancer Maternal Uncle 68    GI cancer (? pancreatic)  . Cancer Maternal Aunt 2    breast  . Cancer Cousin 22    breast  . Cancer Cousin 48    breast  . Cancer Father   . Hypertension Sister   . Hypertension Brother       Social history:  reports that she quit smoking about 5 months ago. Her smoking use included Cigarettes. She has a 28 pack-year smoking history. She has never used smokeless tobacco. She reports that she drinks alcohol. She reports that she does not use illicit drugs.    Allergies  Allergen Reactions  . Cymbalta [Duloxetine Hcl]     Confusion  . Sulfa Antibiotics     Medications:  Prior to Admission medications   Medication Sig Start Date End Date Taking? Authorizing Provider  ALPRAZolam Duanne Moron) 0.5 MG tablet Take 0.5 mg by mouth 2 (two) times daily as needed.    Yes Historical Provider, MD  aspirin 81 MG tablet Take 81 mg by mouth daily.   Yes Historical Provider, MD  cyanocobalamin (,VITAMIN B-12,) 1000 MCG/ML injection Inject 1,000 mcg into the muscle every 30 (thirty) days.   Yes Historical Provider, MD  dexlansoprazole (DEXILANT) 60 MG capsule Take 60 mg by mouth 2 (two) times daily.   Yes Historical Provider, MD  fentaNYL (DURAGESIC - DOSED MCG/HR) 50 MCG/HR Place 50 mcg onto the skin every 3 (three) days.   Yes Historical Provider, MD  fexofenadine (ALLEGRA ALLERGY) 180 MG tablet Take 180 mg  by mouth daily.   Yes Historical Provider, MD  folic acid (FOLVITE) 1 MG tablet Take 1 mg by mouth daily. 03/31/14  Yes Historical Provider, MD  gabapentin (NEURONTIN) 600 MG tablet Take 600 mg by mouth 4 (four) times daily.   Yes Historical Provider, MD  hydrochlorothiazide (HYDRODIURIL) 25 MG tablet Take 25 mg by mouth daily.   Yes Historical Provider, MD  ondansetron (ZOFRAN-ODT) 4 MG disintegrating tablet Take 4 mg by mouth every 8 (eight) hours as needed for nausea or vomiting.   Yes Historical Provider, MD  oxyCODONE-acetaminophen (PERCOCET) 7.5-325 MG per tablet Take 1 tablet by mouth every 8 (eight) hours as needed for pain.   Yes Historical Provider, MD    ROS:  Out of a complete 14 system review of symptoms, the patient complains only of the following symptoms, and all other  reviewed systems are negative.  Frequent waking, sleepwalking Shoulder pain, back pain, achy muscles Headache  Blood pressure 138/93, pulse 106, height 5\' 6"  (1.676 m), weight 105 lb 9.6 oz (47.9 kg).  Physical Exam  General: The patient is alert and cooperative at the time of the examination.  Skin: No significant peripheral edema is noted.   Neurologic Exam  Mental status: The patient is alert and oriented x 3 at the time of the examination. The patient has apparent normal recent and remote memory, with an apparently normal attention span and concentration ability.   Cranial nerves: Facial symmetry is present. Speech is normal, no aphasia or dysarthria is noted. Extraocular movements are full. Visual fields are full.  Motor: The patient has good strength in all 4 extremities.  Sensory examination: Soft touch sensation is symmetric on the face, arms, and legs.  Coordination: The patient has good finger-nose-finger and heel-to-shin bilaterally. No tremor is seen.  Gait and station: The patient has a normal gait. Tandem gait is normal. Romberg is negative. No drift is seen.  Reflexes: Deep tendon reflexes are symmetric.   Assessment/Plan:  1. Fibromyalgia  2. Reported intermittent tremors  The patient will be placed on low-dose nortriptyline at nighttime for the fibromyalgia symptoms. She will go up to 20 mg at night. If she requires more medications, she will call our office. She will follow-up otherwise in 6 months. The tremors are intermittent, I see no evidence of tremor on the clinical examination today.  Brandy Alexanders MD 12/07/2014 7:13 PM  [facility] [address] [place], [place] [address]  Phone [phone] Fax [phone]

## 2014-12-07 NOTE — Patient Instructions (Signed)
Fibromyalgia Fibromyalgia is a disorder that is often misunderstood. It is associated with muscular pains and tenderness that comes and goes. It is often associated with fatigue and sleep disturbances. Though it tends to be long-lasting, fibromyalgia is not life-threatening. CAUSES  The exact cause of fibromyalgia is unknown. People with certain gene types are predisposed to developing fibromyalgia and other conditions. Certain factors can play a role as triggers, such as:  Spine disorders.  Arthritis.  Severe injury (trauma) and other physical stressors.  Emotional stressors. SYMPTOMS   The main symptom is pain and stiffness in the muscles and joints, which can vary over time.  Sleep and fatigue problems. Other related symptoms may include:  Bowel and bladder problems.  Headaches.  Visual problems.  Problems with odors and noises.  Depression or mood changes.  Painful periods (dysmenorrhea).  Dryness of the skin or eyes. DIAGNOSIS  There are no specific tests for diagnosing fibromyalgia. Patients can be diagnosed accurately from the specific symptoms they have. The diagnosis is made by determining that nothing else is causing the problems. TREATMENT  There is no cure. Management includes medicines and an active, healthy lifestyle. The goal is to enhance physical fitness, decrease pain, and improve sleep. HOME CARE INSTRUCTIONS   Only take over-the-counter or prescription medicines as directed by your caregiver. Sleeping pills, tranquilizers, and pain medicines may make your problems worse.  Low-impact aerobic exercise is very important and advised for treatment. At first, it may seem to make pain worse. Gradually increasing your tolerance will overcome this feeling.  Learning relaxation techniques and how to control stress will help you. Biofeedback, visual imagery, hypnosis, muscle relaxation, yoga, and meditation are all options.  Anti-inflammatory medicines and  physical therapy may provide short-term help.  Acupuncture or massage treatments may help.  Take muscle relaxant medicines as suggested by your caregiver.  Avoid stressful situations.  Plan a healthy lifestyle. This includes your diet, sleep, rest, exercise, and friends.  Find and practice a hobby you enjoy.  Join a fibromyalgia support group for interaction, ideas, and sharing advice. This may be helpful. SEEK MEDICAL CARE IF:  You are not having good results or improvement from your treatment. FOR MORE INFORMATION  National Fibromyalgia Association: www.fmaware.org Arthritis Foundation: www.arthritis.org Document Released: 08/05/2005 Document Revised: 10/28/2011 Document Reviewed: 11/15/2009 ExitCare Patient Information 2015 [employer]. This information is not intended to replace advice given to you by your health care provider. Make sure you discuss any questions you have with your health care provider.  

## 2015-04-18 ENCOUNTER — Telehealth: Payer: Self-pay | Admitting: Neurology

## 2015-04-18 NOTE — Telephone Encounter (Signed)
Patient called, says that her "shaking has gotten a lot worse, fibromyalgia pain is a whole lot worse". Patient is tearful.

## 2015-04-18 NOTE — Telephone Encounter (Signed)
I called the patient. She stated that she has had pain all over for a few months now. Her PCP told her there was nothing else he could do for her. She saw a rheumatologist. He gave her Flexeril 20 mg and told her to take 1/4 of a tablet every couple of nights and she could work her way up to a whole tablet. He also advised that she call Dr. Jannifer Franklin. The patient also c/o being forgetful. She stated that yesterday she was supposed to go see the rheumatologist, but she came to our office instead. I advised that Dr. Jannifer Franklin may not write for any other medication until we see if the Flexeril the other doctor ordered for her helped, but that I would let Dr. Jannifer Franklin know what is going on. She verbalized understanding.

## 2015-04-18 NOTE — Telephone Encounter (Signed)
The patient has an appointment in October, but she wants to be seen sooner, her fibromyalgia symptoms have worsened, she has tremors. I will try to get her worked in next 2 or 3  weeks

## 2015-04-19 NOTE — Telephone Encounter (Signed)
I called the patient. Appointment rescheduled to 9/13.

## 2015-05-02 ENCOUNTER — Encounter: Payer: Self-pay | Admitting: Neurology

## 2015-05-02 ENCOUNTER — Ambulatory Visit (INDEPENDENT_AMBULATORY_CARE_PROVIDER_SITE_OTHER): Payer: BLUE CROSS/BLUE SHIELD | Admitting: Neurology

## 2015-05-02 VITALS — BP 136/93 | HR 92 | Ht 66.0 in | Wt 104.5 lb

## 2015-05-02 DIAGNOSIS — R413 Other amnesia: Secondary | ICD-10-CM

## 2015-05-02 DIAGNOSIS — R251 Tremor, unspecified: Secondary | ICD-10-CM

## 2015-05-02 DIAGNOSIS — R258 Other abnormal involuntary movements: Secondary | ICD-10-CM

## 2015-05-02 DIAGNOSIS — M797 Fibromyalgia: Secondary | ICD-10-CM

## 2015-05-02 MED ORDER — PROPRANOLOL HCL 10 MG PO TABS: ORAL_TABLET | ORAL | Status: DC

## 2015-05-02 NOTE — Patient Instructions (Addendum)
   We will start propranolol for the tremors, watch out for dizziness, depression or increased fatigue.    Fibromyalgia Fibromyalgia is a disorder that is often misunderstood. It is associated with muscular pains and tenderness that comes and goes. It is often associated with fatigue and sleep disturbances. Though it tends to be long-lasting, fibromyalgia is not life-threatening. CAUSES  The exact cause of fibromyalgia is unknown. People with certain gene types are predisposed to developing fibromyalgia and other conditions. Certain factors can play a role as triggers, such as:  Spine disorders.  Arthritis.  Severe injury (trauma) and other physical stressors.  Emotional stressors. SYMPTOMS   The main symptom is pain and stiffness in the muscles and joints, which can vary over time.  Sleep and fatigue problems. Other related symptoms may include:  Bowel and bladder problems.  Headaches.  Visual problems.  Problems with odors and noises.  Depression or mood changes.  Painful periods (dysmenorrhea).  Dryness of the skin or eyes. DIAGNOSIS  There are no specific tests for diagnosing fibromyalgia. Patients can be diagnosed accurately from the specific symptoms they have. The diagnosis is made by determining that nothing else is causing the problems. TREATMENT  There is no cure. Management includes medicines and an active, healthy lifestyle. The goal is to enhance physical fitness, decrease pain, and improve sleep. HOME CARE INSTRUCTIONS   Only take over-the-counter or prescription medicines as directed by your caregiver. Sleeping pills, tranquilizers, and pain medicines may make your problems worse.  Low-impact aerobic exercise is very important and advised for treatment. At first, it may seem to make pain worse. Gradually increasing your tolerance will overcome this feeling.  Learning relaxation techniques and how to control stress will help you. Biofeedback, visual  imagery, hypnosis, muscle relaxation, yoga, and meditation are all options.  Anti-inflammatory medicines and physical therapy may provide short-term help.  Acupuncture or massage treatments may help.  Take muscle relaxant medicines as suggested by your caregiver.  Avoid stressful situations.  Plan a healthy lifestyle. This includes your diet, sleep, rest, exercise, and friends.  Find and practice a hobby you enjoy.  Join a fibromyalgia support group for interaction, ideas, and sharing advice. This may be helpful. SEEK MEDICAL CARE IF:  You are not having good results or improvement from your treatment. FOR MORE INFORMATION  National Fibromyalgia Association: www.fmaware.[place]: www.arthritis.org Document Released: 08/05/2005 Document Revised: 10/28/2011 Document Reviewed: 11/15/2009 [employer] Patient Information 2015 [place], [place]. This information is not intended to replace advice given to you by your health care provider. Make sure you discuss any questions you have with your health care provider.

## 2015-05-02 NOTE — Progress Notes (Signed)
Reason for visit: Fibromyalgia, tremor  Brandy Gutierrez is an 54 y.o. female  History of present illness:  Brandy Gutierrez is a 54 year old right-handed white female with a history of fibromyalgia, chronic pain, and tremors. The patient is no longer working, she will be applying for disability. The patient has reported some memory issues as well, and she has sweats at night. The patient has had a total hysterectomy, she is on some hormone replacement. The patient recently was seen by a rheumatologist, and she was placed on Flexeril at night taking 10 mg. The patient is off of the nortriptyline. She feels that the tremors have gotten much worse, she is not able to perform many tasks because of the tremor. She remains upset and anxious about the pain level, she has been placed on diazepam as well. The patient remains on a 50 g fentanyl patch, and she takes oxycodone tablets, 3 tablets daily. She has been on the fentanyl patch for least 8 years.  Past Medical History  Diagnosis Date  . Cervical cancer     dx at age 62  . Fibromyalgia   . Memory disorder 04/08/2014  . Vitamin B12 deficiency   . Somnambulism   . Chronic insomnia   . Pneumonia   . COPD (chronic obstructive pulmonary disease)   . GERD (gastroesophageal reflux disease)   . Stomach ulcer     Past Surgical History  Procedure Laterality Date  . Total abdominal hysterectomy      age 35 for cervical cancer  . Salpingoophorectomy      age 89 for cervical cancer    Family History  Problem Relation Age of Onset  . Cancer Mother 35    breast  . Cancer Maternal Aunt 63    breast  . Cancer Maternal Uncle 2    colon  . Cancer Maternal Grandmother 26    breast  . Stroke Maternal Grandmother   . Cancer Maternal Grandfather 65    throat - smoker  . Cancer Maternal Aunt 40    unknown type of cancer  . Cancer Maternal Uncle 68    GI cancer (? pancreatic)  . Cancer Maternal Aunt 63    breast  . Cancer Cousin 22   breast  . Cancer Cousin 61    breast  . Cancer Father   . Hypertension Sister   . Hypertension Brother     Social history:  reports that she quit smoking about 10 months ago. Her smoking use included Cigarettes. She has a 28 pack-year smoking history. She has never used smokeless tobacco. She reports that she drinks about 3.6 oz of alcohol per week. She reports that she does not use illicit drugs.    Allergies  Allergen Reactions  . Cymbalta [Duloxetine Hcl]     Confusion  . Sulfa Antibiotics     Medications:  Prior to Admission medications   Medication Sig Start Date End Date Taking? Authorizing Provider  aspirin 81 MG tablet Take 81 mg by mouth daily.   Yes Historical Provider, MD  cyanocobalamin (,VITAMIN B-12,) 1000 MCG/ML injection Inject 1,000 mcg into the muscle every 30 (thirty) days.   Yes Historical Provider, MD  cyclobenzaprine (FLEXERIL) 10 MG tablet Take 10 mg by mouth at bedtime.   Yes Historical Provider, MD  dexlansoprazole (DEXILANT) 60 MG capsule Take 60 mg by mouth 2 (two) times daily.   Yes Historical Provider, MD  diazepam (VALIUM) 2 MG tablet Take 2 mg by mouth every  12 (twelve) hours as needed for anxiety.   Yes Historical Provider, MD  estrogens, conjugated, (PREMARIN) 0.625 MG tablet Take 0.625 mg by mouth daily. Take daily for 21 days then do not take for 7 days.   Yes Historical Provider, MD  fentaNYL (DURAGESIC - DOSED MCG/HR) 50 MCG/HR Place 50 mcg onto the skin every 3 (three) days.   Yes Historical Provider, MD  fexofenadine (ALLEGRA ALLERGY) 180 MG tablet Take 180 mg by mouth daily.   Yes Historical Provider, MD  folic acid (FOLVITE) 1 MG tablet Take 1 mg by mouth daily. 03/31/14  Yes Historical Provider, MD  gabapentin (NEURONTIN) 600 MG tablet Take 600 mg by mouth 4 (four) times daily.   Yes Historical Provider, MD  hydrochlorothiazide (HYDRODIURIL) 25 MG tablet Take 25 mg by mouth daily.   Yes Historical Provider, MD  ondansetron (ZOFRAN-ODT) 4 MG  disintegrating tablet Take 4 mg by mouth every 8 (eight) hours as needed for nausea or vomiting.   Yes Historical Provider, MD  oxyCODONE-acetaminophen (PERCOCET) 7.5-325 MG per tablet Take 1 tablet by mouth every 8 (eight) hours as needed for pain.   Yes Historical Provider, MD    ROS:  Out of a complete 14 system review of symptoms, the patient complains only of the following symptoms, and all other reviewed systems are negative.  Decreased activity, fatigue, excessive sweating Blurred vision Cold intolerance Constipation, diarrhea Restless legs, insomnia, frequent waking Joint pain, joint swelling, back pain, achy muscles, muscle cramps Memory loss, dizziness, headache, weakness, tremors Confusion, depression, anxiety  Blood pressure 136/93, pulse 92, height 5\' 6"  (1.676 m), weight 104 lb 8 oz (47.401 kg).  Physical Exam  General: The patient is alert and cooperative at the time of the examination.  Skin: No significant peripheral edema is noted.   Neurologic Exam  Mental status: The patient is alert and oriented x 3 at the time of the examination. The patient has apparent normal recent and remote memory, with an apparently normal attention span and concentration ability. The patient is somewhat anxious, nervous, tremulous.   Cranial nerves: Facial symmetry is present. Speech is normal, no aphasia or dysarthria is noted. Extraocular movements are full. Visual fields are full.  Motor: The patient has good strength in all 4 extremities.  Sensory examination: Soft touch sensation is symmetric on the face, arms, and legs.  Coordination: The patient has good finger-nose-finger and heel-to-shin bilaterally. An intention tremor is seen with finger-nose-finger bilaterally.  Gait and station: The patient has a normal gait. Tandem gait is normal. Romberg is negative. No drift is seen.  Reflexes: Deep tendon reflexes are symmetric.   Assessment/Plan:  1. Fibromyalgia  2.  Reported memory disturbance  3. Tremor  The patient is having significant issues with ongoing pain, she recently was placed on Flexeril, she will try that for several weeks. The patient has come off of the nortriptyline. She will be given a trial on low-dose propranolol for the tremors, and hopefully this will help some of the anxiety issues she is also suffering from. If depression worsens, she will need to stop the medication. She will follow-up in 4 months. In the future, medication such as Ocie Cornfield can be used for the fibromyalgia pain. The patient could not tolerate Cymbalta well.  Jill Alexanders MD 05/02/2015 7:22 PM  [facility] [address] [place], [place] [address]  Phone [phone] Fax [phone]

## 2015-06-08 ENCOUNTER — Ambulatory Visit: Payer: BLUE CROSS/BLUE SHIELD | Admitting: Adult Health

## 2015-06-20 ENCOUNTER — Telehealth: Payer: Self-pay | Admitting: Neurology

## 2015-06-20 NOTE — Telephone Encounter (Signed)
I called patient. The patient indicates that she will have depression that comes and goes. I have recommended that she stop the propranolol to see if she does better, this can exacerbate depression.

## 2015-06-20 NOTE — Telephone Encounter (Signed)
Pt called sts Dr Jannifer Franklin had recommended she see a therapist. She is wanting to discuss this. Please call and advise [phone] or [phone]

## 2015-06-20 NOTE — Telephone Encounter (Signed)
I called the patient. She stated that Dr. Jannifer Franklin mentioned referring her to a psychiatrist, if she would like. She stated she is ready to see a psychiatrist. She states she cries all the time and feels very emotional.

## 2015-06-28 DIAGNOSIS — Z0271 Encounter for disability determination: Secondary | ICD-10-CM

## 2015-09-05 ENCOUNTER — Encounter: Payer: Self-pay | Admitting: Adult Health

## 2015-09-05 ENCOUNTER — Ambulatory Visit (INDEPENDENT_AMBULATORY_CARE_PROVIDER_SITE_OTHER): Payer: BLUE CROSS/BLUE SHIELD | Admitting: Adult Health

## 2015-09-05 VITALS — BP 101/70 | HR 71 | Ht 66.0 in | Wt 105.0 lb

## 2015-09-05 DIAGNOSIS — R251 Tremor, unspecified: Secondary | ICD-10-CM | POA: Diagnosis not present

## 2015-09-05 DIAGNOSIS — M797 Fibromyalgia: Secondary | ICD-10-CM | POA: Diagnosis not present

## 2015-09-05 MED ORDER — PROPRANOLOL HCL 10 MG PO TABS: 10.0000 mg | ORAL_TABLET | Freq: Two times a day (BID) | ORAL | Status: DC

## 2015-09-05 NOTE — Progress Notes (Signed)
PATIENT: Brandy Gutierrez DOB: 1961-05-23  REASON FOR VISIT: follow up-  Fibromyalgia , chronic pain, tremors HISTORY FROM: patient  HISTORY OF PRESENT ILLNESS:  Brandy Gutierrez is a 55 year old female with a history of fibromyalgia , chronic pain and tremors. She returns today for follow-up. The patient is currently on fentanyl , Flexeril and oxycodone for her chronic pain. This is prescribed by her primary care provider. At the last visit with Dr. Jannifer Franklin the patient was started on propranolol for tremors. She reports that this medication has helped tremendously with her tremors. She states that she is able to hold a cup without trembling. She states that this medication has also helped her jitteriness. She states that she could not tolerate the increased dose so she takes 10 mg 2 times a day often taking half a tablet 3-4 hours apart. She states that this has been working well for her. The patient is tearful during the office visit today. She states that she has seen a psychiatrist but does not want to be on medication at this time. She returns today for an evaluation.  HISTORY 05/02/15: Brandy Gutierrez is a 55 year old right-handed white female with a history of fibromyalgia, chronic pain, and tremors. The patient is no longer working, she will be applying for disability. The patient has reported some memory issues as well, and she has sweats at night. The patient has had a total hysterectomy, she is on some hormone replacement. The patient recently was seen by a rheumatologist, and she was placed on Flexeril at night taking 10 mg. The patient is off of the nortriptyline. She feels that the tremors have gotten much worse, she is not able to perform many tasks because of the tremor. She remains upset and anxious about the pain level, she has been placed on diazepam as well. The patient remains on a 50 g fentanyl patch, and she takes oxycodone tablets, 3 tablets daily. She has been on the fentanyl  patch for least 8 years.  REVIEW OF SYSTEMS: Out of a complete 14 system review of symptoms, the patient complains only of the following symptoms, and all other reviewed systems are negative.  ALLERGIES: Allergies  Allergen Reactions  . Cymbalta [Duloxetine Hcl]     Confusion  . Sulfa Antibiotics     HOME MEDICATIONS: Outpatient Prescriptions Prior to Visit  Medication Sig Dispense Refill  . aspirin 81 MG tablet Take 81 mg by mouth daily.    . cyanocobalamin (,VITAMIN B-12,) 1000 MCG/ML injection Inject 1,000 mcg into the muscle every 30 (thirty) days.    . cyclobenzaprine (FLEXERIL) 10 MG tablet Take 10 mg by mouth 3 times/day as needed-between meals & bedtime.     Marland Kitchen dexlansoprazole (DEXILANT) 60 MG capsule Take 60 mg by mouth 2 (two) times daily.    Marland Kitchen estrogens, conjugated, (PREMARIN) 0.625 MG tablet Take 0.625 mg by mouth daily. Take daily for 21 days then do not take for 7 days.    . fentaNYL (DURAGESIC - DOSED MCG/HR) 50 MCG/HR Place 50 mcg onto the skin every 3 (three) days.    . fexofenadine (ALLEGRA ALLERGY) 180 MG tablet Take 180 mg by mouth daily.    . folic acid (FOLVITE) 1 MG tablet Take 1 mg by mouth daily.    Marland Kitchen gabapentin (NEURONTIN) 600 MG tablet Take 600 mg by mouth 4 (four) times daily.    . hydrochlorothiazide (HYDRODIURIL) 25 MG tablet Take 25 mg by mouth daily.    . ondansetron (ZOFRAN-ODT)  4 MG disintegrating tablet Take 4 mg by mouth every 8 (eight) hours as needed for nausea or vomiting.    Marland Kitchen oxyCODONE-acetaminophen (PERCOCET) 7.5-325 MG per tablet Take 1 tablet by mouth every 8 (eight) hours as needed for pain.    . diazepam (VALIUM) 2 MG tablet Take 2 mg by mouth every 12 (twelve) hours as needed for anxiety.     No facility-administered medications prior to visit.    PAST MEDICAL HISTORY: Past Medical History  Diagnosis Date  . Cervical cancer (HCC)     dx at age 39  . Fibromyalgia   . Memory disorder 04/08/2014  . Vitamin B12 deficiency   .  Somnambulism   . Chronic insomnia   . Pneumonia   . COPD (chronic obstructive pulmonary disease) ([place])   . GERD (gastroesophageal reflux disease)   . Stomach ulcer     PAST SURGICAL HISTORY: Past Surgical History  Procedure Laterality Date  . Total abdominal hysterectomy      age 16 for cervical cancer  . Salpingoophorectomy      age 36 for cervical cancer    FAMILY HISTORY: Family History  Problem Relation Age of Onset  . Cancer Mother 43    breast  . Cancer Maternal Aunt 16    breast  . Cancer Maternal Uncle 46    colon  . Cancer Maternal Grandmother 30    breast  . Stroke Maternal Grandmother   . Cancer Maternal Grandfather 65    throat - smoker  . Cancer Maternal Aunt 40    unknown type of cancer  . Cancer Maternal Uncle 68    GI cancer (? pancreatic)  . Cancer Maternal Aunt 59    breast  . Cancer Cousin 22    breast  . Cancer Cousin 55    breast  . Cancer Father   . Hypertension Sister   . Hypertension Brother     SOCIAL HISTORY: Social History   Social History  . Marital Status: Married    Spouse Name: N/A  . Number of Children: 1  . Years of Education: GED   Occupational History  . MANANGERS    Social History Main Topics  . Smoking status: Former Smoker -- 1.00 packs/day for 28 years    Types: Cigarettes    Quit date: 06/19/2014  . Smokeless tobacco: Never Used  . Alcohol Use: 3.6 oz/week    6 Standard drinks or equivalent per week  . Drug Use: No  . Sexual Activity: Not on file   Other Topics Concern  . Not on file   Social History Narrative   Patient is right handed.   Patient does not drink caffeine.      PHYSICAL EXAM  Filed Vitals:   09/05/15 1135  BP: 101/70  Pulse: 71  Height: 5\' 6"  (1.676 m)  Weight: 105 lb (47.628 kg)   Body mass index is 16.96 kg/(m^2).  Generalized: Well developed, in no acute distress   Neurological examination  Mentation: Alert oriented to time, place, history taking. Follows all commands  speech and language fluent Cranial nerve II-XII: Pupils were equal round reactive to light. Extraocular movements were full, visual field were full on confrontational test. Facial sensation and strength were normal. Uvula tongue midline. Head turning and shoulder shrug  were normal and symmetric. Motor: The motor testing reveals 5 over 5 strength of all 4 extremities. Good symmetric motor tone is noted throughout.  Sensory: Sensory testing is intact to soft touch  on all 4 extremities. No evidence of extinction is noted.  Coordination: Cerebellar testing reveals good finger-nose-finger and heel-to-shin bilaterally.  Gait and station: Gait is normal. Tandem gait is normal. Romberg is negative. No drift is seen.  Reflexes: Deep tendon reflexes are symmetric and normal bilaterally.   DIAGNOSTIC DATA (LABS, IMAGING, TESTING) - I reviewed patient records, labs, notes, testing and imaging myself where available.  Lab Results  Component Value Date   WBC 4.5 07/29/2014   HGB 14.9 07/29/2014   HCT 44.2 07/29/2014   MCV 94 07/29/2014   PLT 163 07/29/2014      Component Value Date/Time   NA 141 07/29/2014 1253   K 4.1 07/29/2014 1253   CL 99 07/29/2014 1253   CO2 26 07/29/2014 1253   GLUCOSE 76 07/29/2014 1253   BUN 15 07/29/2014 1253   CREATININE 0.94 07/29/2014 1253   CALCIUM 9.7 07/29/2014 1253   PROT 6.4 07/29/2014 1253   ALBUMIN 4.0 07/29/2014 1253   AST 29 07/29/2014 1253   ALT 17 07/29/2014 1253   ALKPHOS 57 07/29/2014 1253   BILITOT <0.2 07/29/2014 1253   GFRNONAA 70 07/29/2014 1253   GFRAA 81 07/29/2014 1253       ASSESSMENT AND PLAN 55 y.o. year old female  has a past medical history of Cervical cancer ([place]); Fibromyalgia; Memory disorder (04/08/2014); Vitamin B12 deficiency; Somnambulism; Chronic insomnia; Pneumonia; COPD (chronic obstructive pulmonary disease) ([place]); GERD (gastroesophageal reflux disease); and Stomach ulcer. here with:   1. Fibromyalgia  2. Tremors    The patient has had a good response with propranolol. Her tremors have improved tremendously per the patient. She will continue on propranolol 10 mg 2 times a day. Patient will continue to follow with her primary care for fibromyalgia. Patient advised that if her symptoms worsen or she develops new symptoms she should let us know. She will follow-up in 6 months or sooner if needed.     Ward Givens, MSN, NP-C 09/05/2015, 4:49 PM [facility] [address] [place], [place] [address] [phone]

## 2015-09-05 NOTE — Progress Notes (Signed)
I have read the note, and I agree with the clinical assessment and plan.  Brandy Gutierrez   

## 2015-09-05 NOTE — Patient Instructions (Signed)
Continue Propranolol 10 mg twice a day If your symptoms worsen or you develop new symptoms please let us know.

## 2016-01-26 ENCOUNTER — Telehealth: Payer: Self-pay | Admitting: Adult Health

## 2016-01-26 NOTE — Telephone Encounter (Signed)
Faxed Records to [facility] on 01/26/16 and recv'd confirmation 01/26/16 at 1454.

## 2016-02-15 DIAGNOSIS — Z0289 Encounter for other administrative examinations: Secondary | ICD-10-CM

## 2016-02-26 ENCOUNTER — Other Ambulatory Visit: Payer: Self-pay | Admitting: Neurology

## 2016-03-06 ENCOUNTER — Ambulatory Visit (INDEPENDENT_AMBULATORY_CARE_PROVIDER_SITE_OTHER): Payer: BLUE CROSS/BLUE SHIELD | Admitting: Adult Health

## 2016-03-06 ENCOUNTER — Encounter: Payer: Self-pay | Admitting: Adult Health

## 2016-03-06 VITALS — BP 126/79 | HR 84 | Ht 66.0 in | Wt 99.0 lb

## 2016-03-06 DIAGNOSIS — M797 Fibromyalgia: Secondary | ICD-10-CM | POA: Diagnosis not present

## 2016-03-06 DIAGNOSIS — G894 Chronic pain syndrome: Secondary | ICD-10-CM | POA: Diagnosis not present

## 2016-03-06 NOTE — Patient Instructions (Signed)
Continue propranolol for tremor Consider seeing a pain specialist to try to reduce narcotic use If your symptoms worsen or you develop new symptoms please let us know.

## 2016-03-06 NOTE — Progress Notes (Signed)
I have read the note, and I agree with the clinical assessment and plan.  Brandy Gutierrez   

## 2016-03-06 NOTE — Progress Notes (Signed)
PATIENT: Brandy Gutierrez DOB: 05-15-1961  REASON FOR VISIT: follow up- fibromyalgia, chronic pain, tremors HISTORY FROM: patient  HISTORY OF PRESENT ILLNESS: Brandy Gutierrez is a 55 year old female with a history of fibromyalgia, chronic pain and tremors. She returns today for follow-up. She continues to use propranolol 10 mg twice a day for her tremors. She states that this is working well for her. Her primary care is managing her fibromyalgia and chronic pain with fentanyl, Flexeril and oxycodone. She states that these medications are not working to control her pain. Her husband states that ideally they would like to get her off some of these medications and try less addictive medications. The patient does feel that she is very sluggish and fatigued with possible depression. We discussed propranolol causing the symptoms however she is adamant that she would rather feel fatigued and have the tremor. Patient states that she does drink alcohol. Sometimes she will have 2-3 drinks a day and other days she will drink at all. Denies any numbness or tingling in the legs. She states that she does has constant throbbing pain in the legs and feet. She had nerve conduction study with [facility] in 2015 that was relatively unremarkable. She returns today for an evaluation.  HISTORY 09/05/15 (MM): Brandy Gutierrez is a 55 year old female with a history of fibromyalgia , chronic pain and tremors. She returns today for follow-up. The patient is currently on fentanyl , Flexeril and oxycodone for her chronic pain. This is prescribed by her primary care provider. At the last visit with Dr. Jannifer Franklin the patient was started on propranolol for tremors. She reports that this medication has helped tremendously with her tremors. She states that she is able to hold a cup without trembling. She states that this medication has also helped her jitteriness. She states that she could not tolerate the increased dose so she takes 10 mg 2 times  a day often taking half a tablet 3-4 hours apart. She states that this has been working well for her. The patient is tearful during the office visit today. She states that she has seen a psychiatrist but does not want to be on medication at this time. She returns today for an evaluation.  HISTORY 05/02/15: Brandy Gutierrez is a 55 year old right-handed white female with a history of fibromyalgia, chronic pain, and tremors. The patient is no longer working, she will be applying for disability. The patient has reported some memory issues as well, and she has sweats at night. The patient has had a total hysterectomy, she is on some hormone replacement. The patient recently was seen by a rheumatologist, and she was placed on Flexeril at night taking 10 mg. The patient is off of the nortriptyline. She feels that the tremors have gotten much worse, she is not able to perform many tasks because of the tremor. She remains upset and anxious about the pain level, she has been placed on diazepam as well. The patient remains on a 50 g fentanyl patch, and she takes oxycodone tablets, 3 tablets daily. She has been on the fentanyl patch for least 8 years.  REVIEW OF SYSTEMS: Out of a complete 14 system review of symptoms, the patient complains only of the following symptoms, and all other reviewed systems are negative.  Constipation, nausea, insomnia, frequent waking, joint pain, joint swelling, back pain, aching muscles, walking difficulty, depression, nervous/anxious, weakness, tremors, numbness, dizziness, activity change, appetite change, fatigue, drooling, leg swelling  ALLERGIES: Allergies  Allergen Reactions  . Cymbalta [  Duloxetine Hcl]     Confusion  . Sulfa Antibiotics     HOME MEDICATIONS: Outpatient Prescriptions Prior to Visit  Medication Sig Dispense Refill  . ALPRAZolam (XANAX) 0.25 MG tablet Take 0.25 mg by mouth at bedtime as needed for anxiety.    Marland Kitchen aspirin 81 MG tablet Take 81 mg by mouth daily.     . cyanocobalamin (,VITAMIN B-12,) 1000 MCG/ML injection Inject 1,000 mcg into the muscle every 30 (thirty) days.    . cyclobenzaprine (FLEXERIL) 10 MG tablet Take 10 mg by mouth 3 times/day as needed-between meals & bedtime.     Marland Kitchen dexlansoprazole (DEXILANT) 60 MG capsule Take 60 mg by mouth 2 (two) times daily.    Marland Kitchen estrogens, conjugated, (PREMARIN) 0.625 MG tablet Take 0.625 mg by mouth daily. Take daily for 21 days then do not take for 7 days.    . fentaNYL (DURAGESIC - DOSED MCG/HR) 50 MCG/HR Place 50 mcg onto the skin every 3 (three) days.    . fexofenadine (ALLEGRA ALLERGY) 180 MG tablet Take 180 mg by mouth daily.    . folic acid (FOLVITE) 1 MG tablet Take 1 mg by mouth daily.    Marland Kitchen gabapentin (NEURONTIN) 600 MG tablet Take 600 mg by mouth 4 (four) times daily.    . hydrochlorothiazide (HYDRODIURIL) 25 MG tablet Take 25 mg by mouth daily.    . ondansetron (ZOFRAN-ODT) 4 MG disintegrating tablet Take 4 mg by mouth every 8 (eight) hours as needed for nausea or vomiting.    Marland Kitchen oxyCODONE-acetaminophen (PERCOCET) 7.5-325 MG per tablet Take 1 tablet by mouth every 8 (eight) hours as needed for pain.    Marland Kitchen propranolol (INDERAL) 10 MG tablet Take 1 tablet (10 mg total) by mouth 2 (two) times daily. 60 tablet 3  . propranolol (INDERAL) 10 MG tablet TAKE 1 TABLET BY MOUTH TWICE A DAY FOR 2 WEEKS, THEN TAKE 2 TABLETS TWICE A DAY 120 tablet 0   No facility-administered medications prior to visit.    PAST MEDICAL HISTORY: Past Medical History  Diagnosis Date  . Cervical cancer (HCC)     dx at age 76  . Fibromyalgia   . Memory disorder 04/08/2014  . Vitamin B12 deficiency   . Somnambulism   . Chronic insomnia   . Pneumonia   . COPD (chronic obstructive pulmonary disease) ([place])   . GERD (gastroesophageal reflux disease)   . Stomach ulcer     PAST SURGICAL HISTORY: Past Surgical History  Procedure Laterality Date  . Total abdominal hysterectomy      age 44 for cervical cancer  .  Salpingoophorectomy      age 85 for cervical cancer    FAMILY HISTORY: Family History  Problem Relation Age of Onset  . Cancer Mother 76    breast  . Cancer Maternal Aunt 49    breast  . Cancer Maternal Uncle 9    colon  . Cancer Maternal Grandmother 82    breast  . Stroke Maternal Grandmother   . Cancer Maternal Grandfather 65    throat - smoker  . Cancer Maternal Aunt 40    unknown type of cancer  . Cancer Maternal Uncle 68    GI cancer (? pancreatic)  . Cancer Maternal Aunt 44    breast  . Cancer Cousin 22    breast  . Cancer Cousin 95    breast  . Cancer Father   . Hypertension Sister   . Hypertension Brother  SOCIAL HISTORY: Social History   Social History  . Marital Status: Married    Spouse Name: N/A  . Number of Children: 1  . Years of Education: GED   Occupational History  . MANANGERS    Social History Main Topics  . Smoking status: Former Smoker -- 1.00 packs/day for 28 years    Types: Cigarettes    Quit date: 06/19/2014  . Smokeless tobacco: Never Used  . Alcohol Use: 3.6 oz/week    6 Standard drinks or equivalent per week  . Drug Use: No  . Sexual Activity: Not on file   Other Topics Concern  . Not on file   Social History Narrative   Patient is right handed.   Patient does not drink caffeine.      PHYSICAL EXAM  Filed Vitals:   03/06/16 1049  BP: 126/79  Pulse: 84  Height: 5\' 6"  (1.676 m)  Weight: 99 lb (44.906 kg)   Body mass index is 15.99 kg/(m^2).  Generalized: Well developed, in no acute distress   Neurological examination  Mentation: Alert oriented to time, place, history taking. Follows all commands speech and language fluent Cranial nerve II-XII: Pupils were equal round reactive to light. Extraocular movements were full, visual field were full on confrontational test. Facial sensation and strength were normal. Uvula tongue midline. Head turning and shoulder shrug  were normal and symmetric. Motor: The motor  testing reveals 5 over 5 strength of all 4 extremities. Good symmetric motor tone is noted throughout.  Sensory: Sensory testing is intact to soft touch on all 4 extremities. No evidence of extinction is noted.  Coordination: Cerebellar testing reveals good finger-nose-finger and heel-to-shin bilaterally.  Gait and station: Gait is wide-based. Tandem gait is unsteady. Romberg is slightly unsteady but negative. Reflexes: Deep tendon reflexes are symmetric and normal bilaterally.   DIAGNOSTIC DATA (LABS, IMAGING, TESTING) - I reviewed patient records, labs, notes, testing and imaging myself where available.   ASSESSMENT AND PLAN 55 y.o. year old female  has a past medical history of Cervical cancer ([place]); Fibromyalgia; Memory disorder (04/08/2014); Vitamin B12 deficiency; Somnambulism; Chronic insomnia; Pneumonia; COPD (chronic obstructive pulmonary disease) ([place]); GERD (gastroesophageal reflux disease); and Stomach ulcer. here with:  1. Fibromyalgia 2. Tremor  The patient will continue on propranolol. Advised that her depression will need to be monitored as propranolol can exacerbate this. Patient verbalized understanding. She should consider seeing a pain specialist to help her wean off narcotics and try less addictive mediation. Her physical exam revealed a wide based gait. Patient denies any numbness or tingling in the legs. We will continue to monitor. Advised that she should not drink alcohol while taking narcotics. She verbalized understanding. She will follow-up in 6 months with Dr. Jannifer Franklin.     Ward Givens, MSN, NP-C 03/06/2016, 11:03 AM [facility] [address] [place], [place] [address] [phone]

## 2016-05-14 ENCOUNTER — Other Ambulatory Visit: Payer: Self-pay | Admitting: Neurology

## 2016-09-10 ENCOUNTER — Encounter: Payer: Self-pay | Admitting: Neurology

## 2016-09-10 ENCOUNTER — Ambulatory Visit (INDEPENDENT_AMBULATORY_CARE_PROVIDER_SITE_OTHER): Payer: BLUE CROSS/BLUE SHIELD | Admitting: Neurology

## 2016-09-10 VITALS — BP 134/86 | HR 78 | Ht 65.0 in | Wt 100.5 lb

## 2016-09-10 DIAGNOSIS — M797 Fibromyalgia: Secondary | ICD-10-CM

## 2016-09-10 DIAGNOSIS — R269 Unspecified abnormalities of gait and mobility: Secondary | ICD-10-CM

## 2016-09-10 DIAGNOSIS — R251 Tremor, unspecified: Secondary | ICD-10-CM | POA: Diagnosis not present

## 2016-09-10 HISTORY — DX: Unspecified abnormalities of gait and mobility: R26.9

## 2016-09-10 MED ORDER — PROPRANOLOL HCL 20 MG PO TABS: 20.0000 mg | ORAL_TABLET | Freq: Three times a day (TID) | ORAL | 5 refills | Status: DC

## 2016-09-10 NOTE — Patient Instructions (Addendum)
   Will go up on the propranolol to 20 mg three times a day. We will check MRI of the brain.  Inderal (propranolol) is a blood pressure medication that is commonly used for migraine headaches. This is a type of beta blocker. The most common side effects include low heart rate, dizziness, fatigue, and increased depression. This medication may worsen asthma. If you believe that you are having side effects on this medication, please contact our office.

## 2016-09-10 NOTE — Progress Notes (Signed)
Reason for visit: Tremor  Brandy Gutierrez is an 56 y.o. female  History of present illness:  Brandy Gutierrez is a 56 year old right-handed white female with a history of fibromyalgia currently on opiate medications and gabapentin for the discomfort without complete benefit. The patient has a tremor that has been present for many years, propranolol has helped this. The patient is tolerating the medication well. Over the last 2 or 3 months, she has had worsening headaches, going from about one a week to daily headaches. The patient also has noted a decline in her ability to ambulate over the last year, she is no longer able to close her eyes in the shower without leaning up against the wall, she feels unsteady with walking. She has not had any recent falls. The tremor also affects the head and neck to some degree. The patient returns for an evaluation.  Past Medical History:  Diagnosis Date  . Cervical cancer (HCC)    dx at age 57  . Chronic insomnia   . COPD (chronic obstructive pulmonary disease) ([place])   . Fibromyalgia   . GERD (gastroesophageal reflux disease)   . Memory disorder 04/08/2014  . Pneumonia   . Somnambulism   . Stomach ulcer   . Vitamin B12 deficiency     Past Surgical History:  Procedure Laterality Date  . SALPINGOOPHORECTOMY     age 52 for cervical cancer  . TOTAL ABDOMINAL HYSTERECTOMY     age 65 for cervical cancer    Family History  Problem Relation Age of Onset  . Cancer Mother 58    breast  . Cancer Maternal Aunt 29    breast  . Cancer Maternal Uncle 39    colon  . Cancer Maternal Grandmother 43    breast  . Stroke Maternal Grandmother   . Cancer Maternal Aunt 40    unknown type of cancer  . Cancer Maternal Uncle 68    GI cancer (? pancreatic)  . Cancer Maternal Aunt 94    breast  . Cancer Father   . Hypertension Sister   . Hypertension Brother   . Cancer Maternal Grandfather 65    throat - smoker  . Cancer Cousin 22    breast  . Cancer  Cousin 75    breast    Social history:  reports that she quit smoking about 2 years ago. Her smoking use included Cigarettes. She has a 28.00 pack-year smoking history. She has never used smokeless tobacco. She reports that she drinks about 3.6 oz of alcohol per week . She reports that she does not use drugs.    Allergies  Allergen Reactions  . Cymbalta [Duloxetine Hcl]     Confusion  . Sulfa Antibiotics     Medications:  Prior to Admission medications   Medication Sig Start Date End Date Taking? Authorizing Provider  ALPRAZolam Duanne Moron) 0.25 MG tablet Take 0.25 mg by mouth at bedtime as needed for anxiety.   Yes Historical Provider, MD  aspirin 81 MG tablet Take 81 mg by mouth daily.   Yes Historical Provider, MD  cyanocobalamin (,VITAMIN B-12,) 1000 MCG/ML injection Inject 1,000 mcg into the muscle every 30 (thirty) days.   Yes Historical Provider, MD  estrogens, conjugated, (PREMARIN) 0.625 MG tablet Take 0.625 mg by mouth every other day. Take daily for 21 days then do not take for 7 days.    Yes Historical Provider, MD  fentaNYL (DURAGESIC - DOSED MCG/HR) 25 MCG/HR patch Place onto the  skin.   Yes Historical Provider, MD  ferrous sulfate 325 (65 FE) MG tablet Take by mouth.   Yes Historical Provider, MD  fexofenadine (ALLEGRA ALLERGY) 180 MG tablet Take 180 mg by mouth daily.   Yes Historical Provider, MD  folic acid (FOLVITE) 1 MG tablet Take 1 mg by mouth daily. 03/31/14  Yes Historical Provider, MD  gabapentin (NEURONTIN) 600 MG tablet Take 600 mg by mouth 4 (four) times daily.   Yes Historical Provider, MD  hydrochlorothiazide (HYDRODIURIL) 25 MG tablet Take 25 mg by mouth daily.   Yes Historical Provider, MD  ondansetron (ZOFRAN-ODT) 4 MG disintegrating tablet Take 4 mg by mouth every 8 (eight) hours as needed for nausea or vomiting.   Yes Historical Provider, MD  oxyCODONE-acetaminophen (PERCOCET) 7.5-325 MG per tablet Take 1 tablet by mouth every 8 (eight) hours as needed for  pain.   Yes Historical Provider, MD  potassium chloride (K-DUR) 10 MEQ tablet Take by mouth.   Yes Historical Provider, MD  propranolol (INDERAL) 10 MG tablet Take 2 tablets (20 mg total) by mouth 2 (two) times daily. 05/14/16  Yes Kathrynn Ducking, MD    ROS:  Out of a complete 14 system review of symptoms, the patient complains only of the following symptoms, and all other reviewed systems are negative.  Decreased activity, decreased appetite, fatigue Hearing loss Leg swelling Cold intolerance Constipation Frequent waking, sleep talking Joint pain, back pain, aching muscles, walking difficulty Headache, weakness Confusion, depression  Blood pressure 134/86, pulse 78, height 5\' 5"  (1.651 m), weight 100 lb 8 oz (45.6 kg).  Physical Exam  General: The patient is alert and cooperative at the time of the examination.  Skin: No significant peripheral edema is noted.   Neurologic Exam  Mental status: The patient is alert and oriented x 3 at the time of the examination. The patient has apparent normal recent and remote memory, with an apparently normal attention span and concentration ability.   Cranial nerves: Facial symmetry is present. Speech is normal, no aphasia or dysarthria is noted. Extraocular movements are full. Visual fields are full. Some head and neck tremor is noted.  Motor: The patient has good strength in all 4 extremities.  Sensory examination: Soft touch sensation is symmetric on the face, arms, and legs.  Coordination: The patient has good finger-nose-finger and heel-to-shin bilaterally. The patient has an intention tremor with finger-nose-finger bilaterally. With drawing a spiral, the patient has tremor translated into the handwriting.  Gait and station: The patient has a slightly wide-based gait, tandem gait is unsteady. Romberg is unsteady, the patient has a tendency to fall backwards. No drift is seen.  Reflexes: Deep tendon reflexes are  symmetric.   Assessment/Plan:  1. Tremor  2. Chronic daily headache  3. Gait disturbance  4. Fibromyalgia  The patient has ongoing fibromyalgia pain. The patient has developed new symptoms of headache that began over the last 2-3 months, possibly correlating with a reduction in the dose of fentanyl from 50 g daily to 25 g daily. The patient has developed worsening gait instability over the last year. The patient will be set up for MRI evaluation of the brain. The patient claims that she has had blood work done recently, she will fax over the results of the blood work. She will otherwise follow-up in 6 months.  Jill Alexanders MD 09/10/2016 11:59 AM  [facility] [address] [place], [place] [address]  Phone [phone] Fax [phone]

## 2016-09-18 ENCOUNTER — Ambulatory Visit (INDEPENDENT_AMBULATORY_CARE_PROVIDER_SITE_OTHER): Payer: BLUE CROSS/BLUE SHIELD

## 2016-09-18 DIAGNOSIS — R251 Tremor, unspecified: Secondary | ICD-10-CM

## 2016-09-18 DIAGNOSIS — R269 Unspecified abnormalities of gait and mobility: Secondary | ICD-10-CM

## 2016-09-20 ENCOUNTER — Telehealth: Payer: Self-pay | Admitting: Neurology

## 2016-09-20 NOTE — Telephone Encounter (Signed)
I called patient. The MRI the brain shows minimal white matter changes. The patient continues to have fibromyalgia pain and chronic daily headaches. She does not wish to go on medications. She is on propranolol and gabapentin for discomfort without full control. She will be seeing Dr. Francesco Runner from pain management next week, she will call me if she wishes me to initiate another medication such as nortriptyline for the fibromyalgia and for the headache. She did not tolerate Cymbalta previously.   MRI brain 09/19/16:  IMPRESSION:  Mildly abnormal MRI brain (without) demonstrating: 1. Scattered periventricular and subcortical chronic small vessel ischemic disease.  2. No acute findings.

## 2017-03-11 ENCOUNTER — Encounter: Payer: Self-pay | Admitting: Adult Health

## 2017-03-11 ENCOUNTER — Ambulatory Visit (INDEPENDENT_AMBULATORY_CARE_PROVIDER_SITE_OTHER): Payer: BLUE CROSS/BLUE SHIELD | Admitting: Adult Health

## 2017-03-11 VITALS — BP 112/81 | HR 90 | Wt 94.2 lb

## 2017-03-11 DIAGNOSIS — M797 Fibromyalgia: Secondary | ICD-10-CM

## 2017-03-11 DIAGNOSIS — R251 Tremor, unspecified: Secondary | ICD-10-CM

## 2017-03-11 MED ORDER — PROPRANOLOL HCL 10 MG PO TABS: 10.0000 mg | ORAL_TABLET | Freq: Two times a day (BID) | ORAL | 11 refills | Status: DC

## 2017-03-11 NOTE — Progress Notes (Signed)
PATIENT: Brandy Gutierrez DOB: 08-Aug-1961  REASON FOR VISIT: follow up- fibromyalgia, tremor, headache HISTORY FROM: patient  HISTORY OF PRESENT ILLNESS: Brandy Gutierrez is a 56 year old female with a history of fibromyalgia, tremor and headaches. She returns today for follow-up. She states that the last visit propranolol was increased to 20 mg 3 times a day. She states that ever since she increased this she feels very fatigued and sleepy. She states that she's only been taking the medicine twice a day but still experiences the symptoms. She states that she has pain all over. Her primary care is providing her with fentanyl patches, oxycodone and gabapentin she reports this does not control her pain. She has an appointment set up with rheumatology to discuss her symptoms. She states that propranolol does control her tremors very well but she would like to decrease to 10 mg twice a day. She returns today for an evaluation.   REVIEW OF SYSTEMS: Out of a complete 14 system review of symptoms, the patient complains only of the following symptoms, and all other reviewed systems are negative.  Activity change, appetite change, chills, fatigue, cold intolerance, constipation, nausea, vomiting, aching muscles, walking difficulty, bruise/bleed easily, headache  ALLERGIES: Allergies  Allergen Reactions  . Cymbalta [Duloxetine Hcl]     Confusion  . Sulfa Antibiotics     HOME MEDICATIONS: Outpatient Medications Prior to Visit  Medication Sig Dispense Refill  . ALPRAZolam (XANAX) 0.25 MG tablet Take 0.25 mg by mouth at bedtime as needed for anxiety.    Marland Kitchen aspirin 81 MG tablet Take 81 mg by mouth daily.    . cyanocobalamin (,VITAMIN B-12,) 1000 MCG/ML injection Inject 1,000 mcg into the muscle every 30 (thirty) days.    Marland Kitchen estrogens, conjugated, (PREMARIN) 0.625 MG tablet Take 0.625 mg by mouth every other day. Take daily for 21 days then do not take for 7 days.     . fentaNYL (DURAGESIC - DOSED  MCG/HR) 25 MCG/HR patch Place onto the skin.    . fexofenadine (ALLEGRA ALLERGY) 180 MG tablet Take 180 mg by mouth daily.    . folic acid (FOLVITE) 1 MG tablet Take 1 mg by mouth daily.    Marland Kitchen gabapentin (NEURONTIN) 600 MG tablet Take 600 mg by mouth 4 (four) times daily.    . hydrochlorothiazide (HYDRODIURIL) 25 MG tablet Take 25 mg by mouth daily.    . ondansetron (ZOFRAN-ODT) 4 MG disintegrating tablet Take 4 mg by mouth every 8 (eight) hours as needed for nausea or vomiting.    Marland Kitchen oxyCODONE-acetaminophen (PERCOCET) 7.5-325 MG per tablet Take 1 tablet by mouth every 8 (eight) hours as needed for pain.    . potassium chloride (K-DUR) 10 MEQ tablet Take by mouth.    . propranolol (INDERAL) 20 MG tablet Take 1 tablet (20 mg total) by mouth 3 (three) times daily. 90 tablet 5  . ferrous sulfate 325 (65 FE) MG tablet Take by mouth.     No facility-administered medications prior to visit.     PAST MEDICAL HISTORY: Past Medical History:  Diagnosis Date  . Abnormality of gait 09/10/2016  . Cervical cancer (HCC)    dx at age 40  . Chronic insomnia   . COPD (chronic obstructive pulmonary disease) ([place])   . Fibromyalgia   . GERD (gastroesophageal reflux disease)   . Memory disorder 04/08/2014  . Pneumonia   . Somnambulism   . Stomach ulcer   . Vitamin B12 deficiency     PAST SURGICAL  HISTORY: Past Surgical History:  Procedure Laterality Date  . SALPINGOOPHORECTOMY     age 41 for cervical cancer  . TOTAL ABDOMINAL HYSTERECTOMY     age 54 for cervical cancer    FAMILY HISTORY: Family History  Problem Relation Age of Onset  . Cancer Mother 66       breast  . Cancer Maternal Aunt 54       breast  . Cancer Maternal Uncle 54       colon  . Cancer Maternal Grandmother 22       breast  . Stroke Maternal Grandmother   . Cancer Maternal Aunt 40       unknown type of cancer  . Cancer Maternal Uncle 68       GI cancer (? pancreatic)  . Cancer Maternal Aunt 25       breast  . Cancer  Father   . Hypertension Sister   . Hypertension Brother   . Cancer Maternal Grandfather 65       throat - smoker  . Cancer Cousin 22       breast  . Cancer Cousin 86       breast    SOCIAL HISTORY: Social History   Social History  . Marital status: Married    Spouse name: N/A  . Number of children: 1  . Years of education: GED   Occupational History  . [employer]   Social History Main Topics  . Smoking status: Former Smoker    Packs/day: 1.00    Years: 28.00    Types: Cigarettes    Quit date: 06/19/2014  . Smokeless tobacco: Never Used  . Alcohol use 3.6 oz/week    6 Standard drinks or equivalent per week  . Drug use: No  . Sexual activity: Not on file   Other Topics Concern  . Not on file   Social History Narrative   Patient is right handed.   Patient does not drink caffeine.      PHYSICAL EXAM  Vitals:   03/11/17 1323  BP: 112/81  Pulse: 90  Weight: 94 lb 3.2 oz (42.7 kg)   Body mass index is 15.68 kg/m.  Generalized: Well developed, in no acute distress   Neurological examination  Mentation: Alert oriented to time, place, history taking. Follows all commands speech and language fluent Cranial nerve II-XII: Pupils were equal round reactive to light. Extraocular movements were full, visual field were full on confrontational test. Facial sensation and strength were normal. Uvula tongue midline. Head turning and shoulder shrug  were normal and symmetric. Motor: The motor testing reveals 5 over 5 strength of all 4 extremities. Good symmetric motor tone is noted throughout.  Sensory: Sensory testing is intact to soft touch on all 4 extremities. No evidence of extinction is noted.  Coordination: Cerebellar testing reveals good finger-nose-finger and heel-to-shin bilaterally.  Gait and station: Patient uses a cane when ambulating. Gait is wide-based and unsteady. Tandem gait not attended. Reflexes: Deep tendon reflexes are symmetric and  normal bilaterally.   DIAGNOSTIC DATA (LABS, IMAGING, TESTING) - I reviewed patient records, labs, notes, testing and imaging myself where available.  Lab Results  Component Value Date   WBC 4.5 07/29/2014   HGB 14.9 07/29/2014   HCT 44.2 07/29/2014   MCV 94 07/29/2014   PLT 163 07/29/2014      Component Value Date/Time   NA 141 07/29/2014 1253   K 4.1 07/29/2014 1253   CL 99  07/29/2014 1253   CO2 26 07/29/2014 1253   GLUCOSE 76 07/29/2014 1253   BUN 15 07/29/2014 1253   CREATININE 0.94 07/29/2014 1253   CALCIUM 9.7 07/29/2014 1253   PROT 6.4 07/29/2014 1253   ALBUMIN 4.0 07/29/2014 1253   AST 29 07/29/2014 1253   ALT 17 07/29/2014 1253   ALKPHOS 57 07/29/2014 1253   BILITOT <0.2 07/29/2014 1253   GFRNONAA 70 07/29/2014 1253   GFRAA 81 07/29/2014 1253   No results found for: CHOL, HDL, LDLCALC, LDLDIRECT, TRIG, CHOLHDL No results found for: HGBA1C No results found for: VITAMINB12 Lab Results  Component Value Date   TSH 2.050 07/29/2014      ASSESSMENT AND PLAN 56 y.o. year old female  has a past medical history of Abnormality of gait (09/10/2016); Cervical cancer ([place]); Chronic insomnia; COPD (chronic obstructive pulmonary disease) ([place]); Fibromyalgia; GERD (gastroesophageal reflux disease); Memory disorder (04/08/2014); Pneumonia; Somnambulism; Stomach ulcer; and Vitamin B12 deficiency. here with:  1. Tremor 2. Fibromyalgia  We will decrease propranolol to 10 mg twice a day. If her tremor worsens she will let us know. She claims to see a rheumatologist to discuss ongoing pain. Her primary care is currently managing her opioid medications. She is advised that if her symptoms worsen or she develops new symptoms she should let us know. She will follow-up in 6 months or sooner if needed.     Ward Givens, MSN, NP-C 03/11/2017, 1:41 PM [facility] [address], [place], [place] [address] [phone]

## 2017-03-11 NOTE — Progress Notes (Signed)
I have read the note, and I agree with the clinical assessment and plan.  WILLIS,CHARLES KEITH   

## 2017-03-11 NOTE — Patient Instructions (Signed)
Your Plan:  Decrease Propranolol to 10 mg twice a day Follow-up with Rheumatologist If your symptoms worsen or you develop new symptoms please let us know.    Thank you for coming to see [place] at [facility]. I hope we have been able to provide you high quality care today.  You may receive a patient satisfaction survey over the next few weeks. We would appreciate your feedback and comments so that we may continue to improve ourselves and the health of our patients.

## 2017-08-06 ENCOUNTER — Encounter: Payer: Self-pay | Admitting: Neurology

## 2017-08-06 ENCOUNTER — Other Ambulatory Visit (HOSPITAL_COMMUNITY): Payer: Self-pay | Admitting: Interventional Radiology

## 2017-08-06 ENCOUNTER — Ambulatory Visit: Payer: BLUE CROSS/BLUE SHIELD | Admitting: Neurology

## 2017-08-06 ENCOUNTER — Other Ambulatory Visit: Payer: Self-pay

## 2017-08-06 VITALS — BP 127/88 | HR 86 | Wt 92.5 lb

## 2017-08-06 DIAGNOSIS — M797 Fibromyalgia: Secondary | ICD-10-CM | POA: Diagnosis not present

## 2017-08-06 DIAGNOSIS — I729 Aneurysm of unspecified site: Secondary | ICD-10-CM

## 2017-08-06 DIAGNOSIS — I671 Cerebral aneurysm, nonruptured: Secondary | ICD-10-CM

## 2017-08-06 DIAGNOSIS — R569 Unspecified convulsions: Secondary | ICD-10-CM | POA: Diagnosis not present

## 2017-08-06 HISTORY — DX: Cerebral aneurysm, nonruptured: I67.1

## 2017-08-06 NOTE — Progress Notes (Signed)
Reason for visit: Fibromyalgia  Brandy Gutierrez is an 56 y.o. female  History of present illness:  Brandy Gutierrez is a 56 year old right-handed white female with a history of fibromyalgia and chronic pain.  The patient is followed in [place], [place] in a pain center, she cannot remember the name of the clinic that she goes to.  The patient was admitted to [facility] on 18 June 2017 with a seizure event.  The patient had been drinking alcohol, she was not eating well and she was having a lot of nausea and vomiting.  The patient was found to have hyponatremia with a sodium of 127 and the magnesium level was also low.  The patient apparently had several seizures, she was placed on Keppra, she was eventually transferred to [facility].  The patient went to a rehabilitation facility following that hospitalization, she is now home.  She has stopped drinking alcohol.  She is no longer on Keppra, she does remain on Lamictal taking 50 mg twice daily.  EEG evaluations in the hospital showed diffuse slowing.  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  The patient currently is on multivitamins, she is starting to gain weight.  She has not had any further seizures.  She is not operating a motor vehicle.  Since the hospitalization her ability to ambulate has worsened, she is now using a walker.  She reports some clouding of memory.  She is in physical therapy currently.  Past Medical History:  Diagnosis Date  . Abnormality of gait 09/10/2016  . Cervical cancer (HCC)    dx at age 27  . Chronic insomnia   . COPD (chronic obstructive pulmonary disease) ([place])   . Fibromyalgia   . GERD (gastroesophageal reflux disease)   . Memory disorder 04/08/2014  . Pneumonia   . Somnambulism   . Stomach ulcer   . Vitamin B12 deficiency      Past Surgical History:  Procedure Laterality Date  . SALPINGOOPHORECTOMY     age 74 for cervical cancer  . TOTAL ABDOMINAL HYSTERECTOMY     age 17 for cervical cancer    Family History  Problem Relation Age of Onset  . Cancer Mother 33       breast  . Cancer Maternal Aunt 3       breast  . Cancer Maternal Uncle 79       colon  . Cancer Maternal Grandmother 62       breast  . Stroke Maternal Grandmother   . Cancer Maternal Aunt 40       unknown type of cancer  . Cancer Maternal Uncle 68       GI cancer (? pancreatic)  . Cancer Maternal Aunt 37       breast  . Cancer Father   . Hypertension Sister   . Hypertension Brother   . Cancer Maternal Grandfather 65       throat - smoker  . Cancer Cousin 22       breast  . Cancer Cousin 38       breast    Social history:  reports that she quit smoking about 3 years ago. Her smoking use included cigarettes. She has a 28.00 pack-year smoking history. she has never used smokeless tobacco. She reports that she drinks about 3.6 oz of alcohol per week. She  reports that she does not use drugs.    Allergies  Allergen Reactions  . Cymbalta [Duloxetine Hcl]     Confusion  . Sulfa Antibiotics     Medications:  Prior to Admission medications   Medication Sig Start Date End Date Taking? Authorizing Provider  albuterol (PROAIR HFA) 108 (90 Base) MCG/ACT inhaler Inhale 2 puffs into the lungs every 6 (six) hours as needed for wheezing or shortness of breath.   Yes [provider]  aspirin 81 MG tablet Take 81 mg by mouth daily.   Yes [provider]  calcium carbonate (TUMS - DOSED IN MG ELEMENTAL CALCIUM) 500 MG chewable tablet Chew 2 tablets by mouth 2 (two) times daily.   Yes [provider]  Cholecalciferol (VITAMIN D-3) 1000 units CAPS Take 1 capsule by mouth daily.   Yes [provider]  cyanocobalamin (,VITAMIN B-12,) 1000 MCG/ML injection Inject 1,000 mcg into the muscle every 30 (thirty)  days.   Yes [provider]  famotidine (PEPCID) 20 MG tablet Take 20 mg by mouth at bedtime. 10/23/16  Yes [provider]  ferrous sulfate 325 (65 FE) MG tablet Take by mouth.   Yes [provider]  fexofenadine (ALLEGRA ALLERGY) 180 MG tablet Take 180 mg by mouth daily.   Yes [provider]  folic acid (FOLVITE) 1 MG tablet Take 1 mg by mouth daily. 03/31/14  Yes [provider]  gabapentin (NEURONTIN) 600 MG tablet Take 600 mg by mouth 4 (four) times daily.   Yes [provider]  lamoTRIgine (LAMICTAL) 25 MG tablet Take 100 mg by mouth daily.   Yes [provider]  lisinopril (PRINIVIL,ZESTRIL) 10 MG tablet Take 10 mg by mouth 2 (two) times daily.   Yes [provider]  Magnesium Oxide 500 MG TABS Take 1 tablet by mouth daily.   Yes [provider]  mirtazapine (REMERON) 15 MG tablet Take 15 mg by mouth at bedtime.   Yes [provider]  Multiple Vitamins-Minerals (MULTIVITAMIN ADULTS PO) Take 1 tablet by mouth daily.   Yes [provider]  propranolol (INDERAL) 10 MG tablet Take 1 tablet (10 mg total) by mouth 2 (two) times daily. 03/11/17  Yes Ward Givens, NP  tiotropium (SPIRIVA) 18 MCG inhalation capsule Place 18 mcg into inhaler and inhale as needed.   Yes [provider]  venlafaxine (EFFEXOR) 37.5 MG tablet Take 37.5 mg by mouth daily.   Yes [provider]    ROS:  Out of a complete 14 system review of symptoms, the patient complains only of the following symptoms, and all other reviewed systems are negative.  Diarrhea Urinary urgency Walking difficulty Memory loss, dizziness, headache, seizures, tremor  Blood pressure 127/88, pulse 86, weight 92 lb 8 oz (42 kg).  Physical Exam  General: The patient is alert and cooperative at the time of the examination.  Skin: No significant peripheral edema is noted.   Neurologic Exam  Mental status: The patient is  alert and oriented x 3 at the time of the examination. The patient has apparent normal recent and remote memory, with an apparently normal attention span and concentration ability.   Cranial nerves: Facial symmetry is present. Speech is normal, no aphasia or dysarthria is noted. Extraocular movements are full. Visual fields are full.  Motor: The patient has good strength in all 4 extremities, with exception that the patient has bilateral foot drops.  Sensory examination: Soft touch sensation is symmetric on the face, arms,  and legs.  Coordination: The patient has good finger-nose-finger and heel-to-shin bilaterally.  Gait and station: The patient has a wide-based gait.  The patient usually uses a walker for ambulation.  She is unable to perform tandem gait, Romberg is positive, the patient falls backwards  Reflexes: Deep tendon reflexes are symmetric, but are depressed.   Assessment/Plan:  1.  Fibromyalgia, chronic pain syndrome  2.  Alcoholism  3.  Recent admission for seizures, electrolyte disturbances  4.  Gait disturbance, bilateral foot drops  5.  Memory disturbance  The patient is recovering from problems with alcohol abuse, weight loss, and malnutrition.  She remains on low-dose Lamictal, she has not had any further seizures.  The patient will have a repeat EEG evaluation.  She will be set up to see Dr. Estanislado Pandy for evaluation of the 7 mm anterior communicating artery aneurysm.  The patient will follow-up to this office in about 6 months.  The patient is now off of opiate medications which is appropriate.  Jill Alexanders MD 08/06/2017 10:10 AM  [facility] [address] [place], [place] [address]  Phone [phone] Fax [phone]

## 2017-08-08 ENCOUNTER — Ambulatory Visit (HOSPITAL_COMMUNITY)
Admission: RE | Admit: 2017-08-08 | Discharge: 2017-08-08 | Disposition: A | Discharge status: Home / Self Care | Payer: BLUE CROSS/BLUE SHIELD | Source: Ambulatory Visit | Attending: Interventional Radiology | Admitting: Interventional Radiology

## 2017-08-08 DIAGNOSIS — I729 Aneurysm of unspecified site: Secondary | ICD-10-CM

## 2017-08-08 HISTORY — PX: IR RADIOLOGIST EVAL & MGMT: IMG5224

## 2017-08-13 ENCOUNTER — Encounter (HOSPITAL_COMMUNITY): Payer: Self-pay | Admitting: Interventional Radiology

## 2017-08-14 ENCOUNTER — Telehealth: Payer: Self-pay | Admitting: Neurology

## 2017-08-14 ENCOUNTER — Ambulatory Visit: Payer: BLUE CROSS/BLUE SHIELD | Admitting: Neurology

## 2017-08-14 DIAGNOSIS — R569 Unspecified convulsions: Secondary | ICD-10-CM | POA: Diagnosis not present

## 2017-08-14 NOTE — Procedures (Signed)
    History:  Brandy Gutierrez is a 56 year old patient with a history of a seizure event that occurred around 18 June 2017 around the time of significant alcohol consumption.  The patient is being evaluated for these seizure events.  This is a routine EEG.  No skull defects are noted.  Medications include aspirin, calcium, vitamin D, vitamin B12, Pepcid, iron supplementation, folic acid, gabapentin, Lamictal, lisinopril, magnesium, mirtazapine, multivitamins, and propranolol.  EEG classification: Normal awake and drowsy  Description of the recording: The background rhythms of this recording consists of a fairly well modulated medium amplitude alpha rhythm of 8 Hz that is reactive to eye opening and closure. As the record progresses, the patient appears to remain in the waking state throughout the recording. Photic stimulation was performed, resulting in a bilateral and symmetric photic driving response. Hyperventilation was also performed, resulting in a minimal buildup of the background rhythm activities without significant slowing seen. Toward the end of the recording, the patient enters the drowsy state with slight symmetric slowing seen. The patient never enters stage II sleep. At no time during the recording does there appear to be evidence of spike or spike wave discharges or evidence of focal slowing. EKG monitor shows no evidence of cardiac rhythm abnormalities with a heart rate of 72.  Impression: This is a normal EEG recording in the waking and drowsy state. No evidence of ictal or interictal discharges are seen.

## 2017-08-14 NOTE — Telephone Encounter (Signed)
I called the patient.  EEG study appears to be relatively unremarkable and awake and drowsy state.  The patient is on low-dose Lamictal, we will not adjust medications at this time.  The patient has not had any seizure recurrence.

## 2017-09-11 ENCOUNTER — Ambulatory Visit: Payer: BLUE CROSS/BLUE SHIELD | Admitting: Adult Health

## 2017-10-27 ENCOUNTER — Telehealth (HOSPITAL_COMMUNITY): Payer: Self-pay

## 2017-10-27 NOTE — Telephone Encounter (Signed)
Called, no answer. Will try back later. AW

## 2018-02-05 ENCOUNTER — Ambulatory Visit: Payer: BLUE CROSS/BLUE SHIELD | Admitting: Adult Health

## 2018-02-05 ENCOUNTER — Encounter: Payer: Self-pay | Admitting: Adult Health

## 2018-02-05 VITALS — BP 154/95 | HR 82 | Wt 107.2 lb

## 2018-02-05 DIAGNOSIS — R569 Unspecified convulsions: Secondary | ICD-10-CM

## 2018-02-05 DIAGNOSIS — G894 Chronic pain syndrome: Secondary | ICD-10-CM | POA: Diagnosis not present

## 2018-02-05 NOTE — Progress Notes (Signed)
I have read the note, and I agree with the clinical assessment and plan.  Charles K Willis   

## 2018-02-05 NOTE — Progress Notes (Signed)
PATIENT: Brandy Gutierrez DOB: 1961/06/09  REASON FOR VISIT: follow up HISTORY FROM: patient  HISTORY OF PRESENT ILLNESS: Today 02/05/18  Brandy Gutierrez is a 57 year old female with a history of fibromyalgia, seizures and chronic pain.  She returns today for follow-up.  She remains on Lamictal well.  She denies any seizure events.  He continues to have trouble with her balance.  She uses a cane.  Denies any falls.  She did see Dr. Estanislado Pandy and he does want to intervene on her aneurysm however he recommended that she gained weight before he would do the surgery.  Patient states that she is now at the weight he recommended and he plans to get back in touch.  She returns today for evaluation.  HISTORY Brandy Gutierrez is a 57 year old right-handed white female with a history of fibromyalgia and chronic pain.  The patient is followed in [place], [place] in a pain center, she cannot remember the name of the clinic that she goes to.  The patient was admitted to [facility] on 18 June 2017 with a seizure event.  The patient had been drinking alcohol, she was not eating well and she was having a lot of nausea and vomiting.  The patient was found to have hyponatremia with a sodium of 127 and the magnesium level was also low.  The patient apparently had several seizures, she was placed on Keppra, she was eventually transferred to [facility].  The patient went to a rehabilitation facility following that hospitalization, she is now home.  She has stopped drinking alcohol.  She is no longer on Keppra, she does remain on Lamictal taking 50 mg twice daily.  EEG evaluations in the hospital showed diffuse slowing.  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  The patient currently is on multivitamins, she is starting to gain weight.  She has  not had any further seizures.  She is not operating a motor vehicle.  Since the hospitalization her ability to ambulate has worsened, she is now using a walker.  She reports some clouding of memory.  She is in physical therapy currently.   REVIEW OF SYSTEMS: Out of a complete 14 system review of symptoms, the patient complains only of the following symptoms, and all other reviewed systems are negative.  See HPI   ALLERGIES: Allergies  Allergen Reactions  . Metronidazole Other (See Comments)  . Cymbalta [Duloxetine Hcl]     Confusion  . Sulfa Antibiotics     HOME MEDICATIONS: Outpatient Medications Prior to Visit  Medication Sig Dispense Refill  . albuterol (PROAIR HFA) 108 (90 Base) MCG/ACT inhaler Inhale 2 puffs into the lungs every 6 (six) hours as needed for wheezing or shortness of breath.    . Cholecalciferol (VITAMIN D-3) 1000 units CAPS Take 1 capsule by mouth daily.    . cyanocobalamin (,VITAMIN B-12,) 1000 MCG/ML injection Inject 1,000 mcg into the muscle every 30 (thirty) days.    . ferrous sulfate 325 (65 FE) MG tablet Take by mouth.    . fexofenadine (ALLEGRA ALLERGY) 180 MG tablet Take 180 mg by mouth daily.    . folic acid (FOLVITE) 1 MG tablet Take 1 mg by mouth daily.    Marland Kitchen gabapentin (NEURONTIN) 600 MG tablet Take 600 mg by mouth 4 (four) times daily.    Marland Kitchen lamoTRIgine (LAMICTAL) 25 MG tablet Take  100 mg by mouth daily.    . mirtazapine (REMERON) 15 MG tablet Take 15 mg by mouth at bedtime.    . Multiple Vitamins-Minerals (MULTIVITAMIN ADULTS PO) Take 1 tablet by mouth daily.    . propranolol (INDERAL) 10 MG tablet Take 1 tablet (10 mg total) by mouth 2 (two) times daily. (Patient taking differently: Take 20 mg by mouth 2 (two) times daily. ) 60 tablet 11  . tiotropium (SPIRIVA) 18 MCG inhalation capsule Place 18 mcg into inhaler and inhale as needed.    Marland Kitchen aspirin 81 MG tablet Take 81 mg by mouth daily.    . calcium carbonate (TUMS - DOSED IN MG ELEMENTAL CALCIUM) 500  MG chewable tablet Chew 2 tablets by mouth 2 (two) times daily.    . famotidine (PEPCID) 20 MG tablet Take 20 mg by mouth at bedtime.    Marland Kitchen lisinopril (PRINIVIL,ZESTRIL) 10 MG tablet Take 10 mg by mouth 2 (two) times daily.    . Magnesium Oxide 500 MG TABS Take 1 tablet by mouth daily.    Marland Kitchen venlafaxine (EFFEXOR) 37.5 MG tablet Take 37.5 mg by mouth daily.     No facility-administered medications prior to visit.     PAST MEDICAL HISTORY: Past Medical History:  Diagnosis Date  . Abnormality of gait 09/10/2016  . Anterior cerebral aneurysm 08/06/2017  . Cervical cancer (HCC)    dx at age 32  . Chronic insomnia   . COPD (chronic obstructive pulmonary disease) ([place])   . Fibromyalgia   . GERD (gastroesophageal reflux disease)   . Memory disorder 04/08/2014  . Pneumonia   . Somnambulism   . Stomach ulcer   . Vitamin B12 deficiency     PAST SURGICAL HISTORY: Past Surgical History:  Procedure Laterality Date  . IR RADIOLOGIST EVAL & MGMT  08/08/2017  . SALPINGOOPHORECTOMY     age 56 for cervical cancer  . TOTAL ABDOMINAL HYSTERECTOMY     age 16 for cervical cancer    FAMILY HISTORY: Family History  Problem Relation Age of Onset  . Cancer Mother 47       breast  . Cancer Maternal Aunt 41       breast  . Cancer Maternal Uncle 27       colon  . Cancer Maternal Grandmother 97       breast  . Stroke Maternal Grandmother   . Cancer Maternal Aunt 40       unknown type of cancer  . Cancer Maternal Uncle 68       GI cancer (? pancreatic)  . Cancer Maternal Aunt 59       breast  . Cancer Father   . Hypertension Sister   . Hypertension Brother   . Cancer Maternal Grandfather 65       throat - smoker  . Cancer Cousin 22       breast  . Cancer Cousin 50       breast    SOCIAL HISTORY: Social History   Socioeconomic History  . Marital status: Married    Spouse name: Not on file  . Number of children: 1  . Years of education: GED  . Highest education level: Not on file    Occupational History  . Occupation: Financial controller: [employer]  Social Needs  . Financial resource strain: Not on file  . Food insecurity:    Worry: Not on file    Inability: Not on file  . Transportation  needs:    Medical: Not on file    Non-medical: Not on file  Tobacco Use  . Smoking status: Former Smoker    Packs/day: 1.00    Years: 28.00    Pack years: 28.00    Types: Cigarettes    Last attempt to quit: 06/19/2014    Years since quitting: 3.6  . Smokeless tobacco: Never Used  Substance and Sexual Activity  . Alcohol use: Yes    Alcohol/week: 3.6 oz    Types: 6 Standard drinks or equivalent per week  . Drug use: No  . Sexual activity: Not on file  Lifestyle  . Physical activity:    Days per week: Not on file    Minutes per session: Not on file  . Stress: Not on file  Relationships  . Social connections:    Talks on phone: Not on file    Gets together: Not on file    Attends religious service: Not on file    Active member of club or organization: Not on file    Attends meetings of clubs or organizations: Not on file    Relationship status: Not on file  . Intimate partner violence:    Fear of current or ex partner: Not on file    Emotionally abused: Not on file    Physically abused: Not on file    Forced sexual activity: Not on file  Other Topics Concern  . Not on file  Social History Narrative   Patient is right handed.   Patient does not drink caffeine.      PHYSICAL EXAM  Vitals:   02/05/18 0912  BP: (!) 154/95  Pulse: 82  Weight: 107 lb 3.2 oz (48.6 kg)   Body mass index is 17.84 kg/m.  Generalized: Well developed, in no acute distress   Neurological examination  Mentation: Alert oriented to time, place, history taking. Follows all commands speech and language fluent Cranial nerve II-XII: Pupils were equal round reactive to light. Extraocular movements were full, visual field were full on confrontational test. Facial sensation  and strength were normal. Uvula tongue midline. Head turning and shoulder shrug  were normal and symmetric. Motor: The motor testing reveals 3 over 5 strength of all 4 extremities. Good symmetric motor tone is noted throughout.  Sensory: Sensory testing is intact to soft touch on all 4 extremities. No evidence of extinction is noted.  Coordination: Cerebellar testing reveals good finger-nose-finger and heel-to-shin bilaterally.  Gait and station: Patient's gait is unsteady.  Her gait is wide-based.  She uses a cane when eating.  Tandem gait not attempted. Reflexes: Deep tendon reflexes are symmetric and normal bilaterally.   DIAGNOSTIC DATA (LABS, IMAGING, TESTING) - I reviewed patient records, labs, notes, testing and imaging myself where available.    ASSESSMENT AND PLAN 57 y.o. year old female  has a past medical history of Abnormality of gait (09/10/2016), Anterior cerebral aneurysm (08/06/2017), Cervical cancer ([place]), Chronic insomnia, COPD (chronic obstructive pulmonary disease) ([place]), Fibromyalgia, GERD (gastroesophageal reflux disease), Memory disorder (04/08/2014), Pneumonia, Somnambulism, Stomach ulcer, and Vitamin B12 deficiency. here with:  1.  Seizures 2.  Chronic pain  Overall patient is doing well.  She will continue on Lamictal.  She has not had any seizure events.  I will check blood work today.  She is advised that if her symptoms worsen or she develops new symptoms she should let us know.  She will follow-up in 6 months or sooner if needed.   Ward Givens, MSN,  NP-C 02/05/2018, 11:14 AM [facility] [address] [place], [place] [address] [phone]

## 2018-02-05 NOTE — Patient Instructions (Signed)
Your Plan:  Continue Lamictal  Blood work today If your symptoms worsen or you develop new symptoms please let us know.    Thank you for coming to see us at [facility]. I hope we have been able to provide you high quality care today.  You may receive a patient satisfaction survey over the next few weeks. We would appreciate your feedback and comments so that we may continue to improve ourselves and the health of our patients.  

## 2018-02-06 LAB — COMPREHENSIVE METABOLIC PANEL
A/G RATIO: 1.7 (ref 1.2–2.2)
ALBUMIN: 4.7 g/dL (ref 3.5–5.5)
ALK PHOS: 61 IU/L (ref 39–117)
ALT: 14 IU/L (ref 0–32)
AST: 22 IU/L (ref 0–40)
BILIRUBIN TOTAL: 0.3 mg/dL (ref 0.0–1.2)
BUN / CREAT RATIO: 21 (ref 9–23)
BUN: 15 mg/dL (ref 6–24)
CHLORIDE: 99 mmol/L (ref 96–106)
CO2: 24 mmol/L (ref 20–29)
Calcium: 10.5 mg/dL — ABNORMAL HIGH (ref 8.7–10.2)
Creatinine, Ser: 0.72 mg/dL (ref 0.57–1.00)
GFR calc non Af Amer: 94 mL/min/{1.73_m2} (ref >59)
GFR, EST AFRICAN AMERICAN: 108 mL/min/{1.73_m2} (ref >59)
GLOBULIN, TOTAL: 2.8 g/dL (ref 1.5–4.5)
GLUCOSE: 85 mg/dL (ref 65–99)
POTASSIUM: 5.4 mmol/L — AB (ref 3.5–5.2)
SODIUM: 141 mmol/L (ref 134–144)
TOTAL PROTEIN: 7.5 g/dL (ref 6.0–8.5)

## 2018-02-06 LAB — CBC WITH DIFFERENTIAL/PLATELET
BASOS ABS: 0.1 10*3/uL (ref 0.0–0.2)
Basos: 1 %
EOS (ABSOLUTE): 0.4 10*3/uL (ref 0.0–0.4)
Eos: 6 %
HEMOGLOBIN: 13.4 g/dL (ref 11.1–15.9)
Hematocrit: 42.2 % (ref 34.0–46.6)
Immature Grans (Abs): 0 10*3/uL (ref 0.0–0.1)
Immature Granulocytes: 0 %
LYMPHS ABS: 2.5 10*3/uL (ref 0.7–3.1)
Lymphs: 36 %
MCH: 30.8 pg (ref 26.6–33.0)
MCHC: 31.8 g/dL (ref 31.5–35.7)
MCV: 97 fL (ref 79–97)
MONOCYTES: 10 %
MONOS ABS: 0.7 10*3/uL (ref 0.1–0.9)
Neutrophils Absolute: 3.3 10*3/uL (ref 1.4–7.0)
Neutrophils: 47 %
Platelets: 295 10*3/uL (ref 150–450)
RBC: 4.35 x10E6/uL (ref 3.77–5.28)
RDW: 14.8 % (ref 12.3–15.4)
WBC: 7 10*3/uL (ref 3.4–10.8)

## 2018-02-06 LAB — LAMOTRIGINE LEVEL: LAMOTRIGINE LVL: 2 ug/mL (ref 2.0–20.0)

## 2018-02-11 ENCOUNTER — Telehealth: Payer: Self-pay | Admitting: *Deleted

## 2018-02-11 NOTE — Telephone Encounter (Signed)
-----   Message from Ward Givens, NP sent at 02/09/2018 11:11 AM EDT ----- Lab work unremarkable with exception that potassium level slightly elevated.  Please forward a copy to her primary care provider.  Please call patient with results

## 2018-02-11 NOTE — Telephone Encounter (Addendum)
Spoke to pt and relayed that the lab results unremarkable except for slightly elevated potassium.  Will forward to Dr. Loyce Dys  [phone], fax [phone].  (received fax confirmation [phone]). Pt verbalized understanding.  Fax confirmation failed multiple times. (pt requested lab results). [phone].  LMVM for pt.  Late entry:  Failed fax this am as well (02-12-18).  Mailed results.

## 2018-05-14 ENCOUNTER — Telehealth (HOSPITAL_COMMUNITY): Payer: Self-pay

## 2018-05-14 NOTE — Telephone Encounter (Signed)
Returned pt's to schedule f/u. AW

## 2018-05-28 ENCOUNTER — Other Ambulatory Visit (HOSPITAL_COMMUNITY): Payer: Self-pay | Admitting: Interventional Radiology

## 2018-05-28 ENCOUNTER — Telehealth (HOSPITAL_COMMUNITY): Payer: Self-pay | Admitting: Radiology

## 2018-05-28 DIAGNOSIS — I671 Cerebral aneurysm, nonruptured: Secondary | ICD-10-CM

## 2018-05-28 NOTE — Telephone Encounter (Signed)
Called pt to schedule her aneurysm tx. She is going to call her husband and call back with a date. JM

## 2018-06-03 ENCOUNTER — Ambulatory Visit (HOSPITAL_COMMUNITY)
Admission: RE | Admit: 2018-06-03 | Discharge: 2018-06-03 | Disposition: A | Discharge status: Home / Self Care | Payer: BLUE CROSS/BLUE SHIELD | Source: Ambulatory Visit | Attending: Interventional Radiology | Admitting: Interventional Radiology

## 2018-06-03 DIAGNOSIS — I671 Cerebral aneurysm, nonruptured: Secondary | ICD-10-CM

## 2018-06-03 NOTE — Progress Notes (Signed)
Chief Complaint: Patient was seen in consultation today for ACOM aneurysm.  Referring Physician(s): Kathrynn Ducking  Supervising Physician: Luanne Bras  Patient Status: [facility] - Out-pt  History of Present Illness: Brandy Gutierrez is a 57 y.o. female with a past medical history of seizures, pneumonia, GERD, gastric ulcers, cervical cancer, vitamin B12 deficiency, fibromyalgia, insomnia, and somnambulism. She is known to [facility] and has been followed by Dr. Estanislado Pandy since 07/2017. She was first referred to [place] by Dr. Jannifer Franklin after an incidental finding of an ACOM aneurysm. She consulted with Dr. Estanislado Pandy 08/08/2017 to discuss management of her ACOM aneurysm. At that time, patient decided to pursue endovascular treatment of her intracranial aneurysm. However, it was advised by Dr. Estanislado Pandy that patient gain weight (so that her weight is at least 105 pounds) prior to procedure.   Patient presents today for consultation to discuss management of ACOM aneurysm. Patient awake and alert sitting in chair. Accompanied by husband. States that she has gained weight, and weighs approximately 111 pounds. Complains of intermittent daily headaches. States headaches vary in location, but her current headache is located on her forehead. Rates headaches as 5/10. States she takes Ibuprofen for headaches which "dulls" them. Denies N/V, vision changes, or dizziness associated with headaches. Complains of dizziness/balance issues since seizure 06/2017. States that these symptoms have improved since seizure. Complains of numbness of bilateral feet up to knees. States her peripheral neuropathy is the cause of this. Complains of bilateral hearing loss. Denies weakness, vision changes, tinnitus, or speech difficulty.   Past Medical History:  Diagnosis Date  . Abnormality of gait 09/10/2016  . Anterior cerebral aneurysm 08/06/2017  . Cervical cancer (HCC)    dx at age 54  . Chronic insomnia   . COPD  (chronic obstructive pulmonary disease) ([place])   . Fibromyalgia   . GERD (gastroesophageal reflux disease)   . Memory disorder 04/08/2014  . Pneumonia   . Somnambulism   . Stomach ulcer   . Vitamin B12 deficiency     Past Surgical History:  Procedure Laterality Date  . IR RADIOLOGIST EVAL & MGMT  08/08/2017  . SALPINGOOPHORECTOMY     age 5 for cervical cancer  . TOTAL ABDOMINAL HYSTERECTOMY     age 64 for cervical cancer    Allergies: Metronidazole; Cymbalta [duloxetine hcl]; and Sulfa antibiotics  Medications: Prior to Admission medications   Medication Sig Start Date End Date Taking? Authorizing Provider  albuterol (PROAIR HFA) 108 (90 Base) MCG/ACT inhaler Inhale 2 puffs into the lungs every 6 (six) hours as needed for wheezing or shortness of breath.    [provider]  Cholecalciferol (VITAMIN D-3) 1000 units CAPS Take 1 capsule by mouth daily.    [provider]  cyanocobalamin (,VITAMIN B-12,) 1000 MCG/ML injection Inject 1,000 mcg into the muscle every 30 (thirty) days.    [provider]  ferrous sulfate 325 (65 FE) MG tablet Take by mouth.    [provider]  fexofenadine (ALLEGRA ALLERGY) 180 MG tablet Take 180 mg by mouth daily.    [provider]  folic acid (FOLVITE) 1 MG tablet Take 1 mg by mouth daily. 03/31/14   [provider]  gabapentin (NEURONTIN) 600 MG tablet Take 600 mg by mouth 4 (four) times daily.    [provider]  lamoTRIgine (LAMICTAL) 25 MG tablet Take 100 mg by mouth daily.    [provider]  mirtazapine (REMERON) 15 MG tablet Take 15 mg by mouth at bedtime.  [provider]  Multiple Vitamins-Minerals (MULTIVITAMIN ADULTS PO) Take 1 tablet by mouth daily.    [provider]  propranolol (INDERAL) 10 MG tablet Take 1 tablet (10 mg total) by mouth 2 (two) times daily. Patient taking differently: Take 20 mg by mouth 2 (two) times daily.  03/11/17   Ward Givens, NP  tiotropium (SPIRIVA) 18 MCG inhalation capsule Place 18 mcg into inhaler and inhale as needed.    [provider]     Family History  Problem Relation Age of Onset  . Cancer Mother 61       breast  . Cancer Maternal Aunt 75       breast  . Cancer Maternal Uncle 63       colon  . Cancer Maternal Grandmother 95       breast  . Stroke Maternal Grandmother   . Cancer Maternal Aunt 40       unknown type of cancer  . Cancer Maternal Uncle 68       GI cancer (? pancreatic)  . Cancer Maternal Aunt 42       breast  . Cancer Father   . Hypertension Sister   . Hypertension Brother   . Cancer Maternal Grandfather 65       throat - smoker  . Cancer Cousin 22       breast  . Cancer Cousin 19       breast    Social History   Socioeconomic History  . Marital status: Married    Spouse name: Not on file  . Number of children: 1  . Years of education: GED  . Highest education level: Not on file  Occupational History  . Occupation: Financial controller: [employer]  Social Needs  . Financial resource strain: Not on file  . Food insecurity:    Worry: Not on file    Inability: Not on file  . Transportation needs:    Medical: Not on file    Non-medical: Not on file  Tobacco Use  . Smoking status: Former Smoker    Packs/day: 1.00    Years: 28.00    Pack years: 28.00    Types: Cigarettes    Last attempt to quit: 06/19/2014    Years since quitting: 3.9  . Smokeless tobacco: Never Used  Substance and Sexual Activity  . Alcohol use: Yes    Alcohol/week: 6.0 standard drinks    Types: 6 Standard drinks or equivalent per week  . Drug use: No  . Sexual activity: Not on file  Lifestyle  . Physical activity:    Days per week: Not on file    Minutes per session: Not on file  . Stress: Not on file  Relationships  . Social connections:    Talks on phone: Not on file    Gets together: Not on file    Attends religious service: Not on file    Active  member of club or organization: Not on file    Attends meetings of clubs or organizations: Not on file    Relationship status: Not on file  Other Topics Concern  . Not on file  Social History Narrative   Patient is right handed.   Patient does not drink caffeine.     Review of Systems: A 12 point ROS discussed and pertinent positives are indicated in the HPI above.  All other systems are negative.  Review of Systems  Constitutional: Negative for chills  and fever.  HENT: Positive for hearing loss. Negative for tinnitus.   Eyes: Negative for visual disturbance.  Respiratory: Negative for shortness of breath and wheezing.   Cardiovascular: Negative for chest pain and palpitations.  Musculoskeletal: Positive for gait problem.  Neurological: Positive for dizziness, numbness and headaches. Negative for speech difficulty and weakness.  Psychiatric/Behavioral: Negative for behavioral problems and confusion.    Vital Signs: There were no vitals taken for this visit.  Physical Exam  Constitutional: She is oriented to person, place, and time. She appears well-developed and well-nourished. No distress.  Pulmonary/Chest: Effort normal. No respiratory distress.  Neurological: She is alert and oriented to person, place, and time.  Skin: Skin is warm and dry.  Psychiatric: She has a normal mood and affect. Her behavior is normal. Judgment and thought content normal.     Imaging: No results found.  Labs:  CBC: Recent Labs    02/05/18 1007  WBC 7.0  HGB 13.4  HCT 42.2  PLT 295    COAGS: No results for input(s): INR, APTT in the last 8760 hours.  BMP: Recent Labs    02/05/18 1007  NA 141  K 5.4*  CL 99  CO2 24  GLUCOSE 85  BUN 15  CALCIUM 10.5*  CREATININE 0.72  GFRNONAA 94  GFRAA 108    LIVER FUNCTION TESTS: Recent Labs    02/05/18 1007  BILITOT 0.3  AST 22  ALT 14  ALKPHOS 61  PROT 7.5  ALBUMIN 4.7    TUMOR MARKERS: No results for input(s): AFPTM,  CEA, CA199, CHROMGRNA in the last 8760 hours.  Assessment and Plan:  ACOM aneurysm. Reviewed imaging with patient and husband. Brought to their attention was patient's ACOM aneurysm. Discussed nature of aneurysms, including risk of rupture. Discussed treatment options, including routine imaging scans to monitor for changes or with a procedure called an image-guided diagnostic cerebral angiogram with endovascular intervention. Explained procedure, including risks and benefits. Patient expresses desire to move forward with endovascular intervention. Procedure is scheduled for 07/01/2018. Explained that in order for procedure to occur, she will have to begin two medications (Plavix 75 mg taken once daily and Aspirin 81 mg taken once daily) 10 days prior to procedure (beginning 06/22/2018), prescription of Plavix given.  Plan for follow-up with an image-guided diagnostic cerebral angiogram with intent to treat ACOM aneurysm (moderate sedation followed by general anesthesia) tentatively for 07/01/2018 with Dr. Estanislado Pandy. Appointment information handout given. Instructed patient to call us if she has any questions prior to procedure.  All questions answered and concerns addressed. Patient and husband convey understanding and agree with plan.  Thank you for this interesting consult.  I greatly enjoyed meeting Avigayil L Weinman and look forward to participating in their care.  A copy of this report was sent to the requesting provider on this date.  Electronically Signed: Earley Abide, PA-C 06/03/2018, 8:19 AM   I spent a total of 25 Minutes in face to face in clinical consultation, greater than 50% of which was counseling/coordinating care for ACOM aneurysm.

## 2018-06-25 ENCOUNTER — Telehealth: Payer: Self-pay | Admitting: Physician Assistant

## 2018-06-25 ENCOUNTER — Other Ambulatory Visit: Payer: Self-pay | Admitting: Physician Assistant

## 2018-06-25 DIAGNOSIS — I671 Cerebral aneurysm, nonruptured: Secondary | ICD-10-CM

## 2018-06-25 NOTE — Telephone Encounter (Signed)
  Patient called c/o severe bruising with Plavix and Aspirin regimen.  She just started the medications on 06/22/18 in preparation for diagnostic cerebral angiogram with intent to treat ACOM aneurysm which is scheduled for 07/01/2018.  I have discussed this with Dr. Bronson Curb.  Will obtain a P2Y12 and adjust medications if needed.  I spoke with her via telephone and she understands to stay on the regimen until instructed differently.   WENDY S BLAIR PA-C 06/25/2018 11:30 AM

## 2018-06-26 ENCOUNTER — Ambulatory Visit (HOSPITAL_COMMUNITY): Payer: Self-pay | Admitting: Radiology

## 2018-06-26 ENCOUNTER — Encounter (HOSPITAL_COMMUNITY)
Admission: RE | Admit: 2018-06-26 | Discharge: 2018-06-26 | Disposition: A | Discharge status: Home / Self Care | Payer: Medicare Other | Attending: Interventional Radiology | Admitting: Interventional Radiology

## 2018-06-26 ENCOUNTER — Other Ambulatory Visit (HOSPITAL_COMMUNITY): Payer: Self-pay | Admitting: Radiology

## 2018-06-26 DIAGNOSIS — Z01812 Encounter for preprocedural laboratory examination: Secondary | ICD-10-CM | POA: Insufficient documentation

## 2018-06-26 DIAGNOSIS — I671 Cerebral aneurysm, nonruptured: Secondary | ICD-10-CM

## 2018-06-26 LAB — PLATELET INHIBITION P2Y12: PLATELET FUNCTION P2Y12: 60 [PRU] — AB (ref 194–418)

## 2018-06-27 ENCOUNTER — Encounter (HOSPITAL_COMMUNITY): Payer: Self-pay | Admitting: *Deleted

## 2018-06-27 NOTE — Progress Notes (Signed)
Denies CP, Shob, or cardiology visit. States she looks like someone "beat" her from being on ASA and Plavix. Patient states she contacted Dr. Arlean Hopping office and is waiting on return call from labs drawn Friday. Patient denies bleeding gums or having nose bleeds, patient denies visible blood in urine or stool. Patient told to follow Dr. Arlean Hopping instructions regarding ASA and Plavix.

## 2018-06-29 ENCOUNTER — Other Ambulatory Visit: Payer: Self-pay | Admitting: Radiology

## 2018-06-29 ENCOUNTER — Telehealth: Payer: Self-pay | Admitting: Student

## 2018-06-29 NOTE — Telephone Encounter (Signed)
Received patient's P2Y12 from 06/26/2018- 60 PRU. Patient is scheduled for an image-guided cerebral angiogram with possible ACA aneurysm embolization 07/01/2018 with Dr. Estanislado Pandy. She is currently taking Plavix 75 mg once daily and Aspirin 81 mg once daily.  Discussed case and result with Dr. Estanislado Pandy. Per Dr. Estanislado Pandy- no indication to change medications based on this result. Patient to continue taking Plavix 75 mg once daily and Aspirin 81 mg once daily.  Our scheduler, Miles Costain, spoke with patient to inform her.  All questions answered and concerns addressed. Patient conveys understanding and agrees with plan.  Bea Graff Louk, PA-C 06/29/2018, 9:50 AM

## 2018-06-30 ENCOUNTER — Encounter (HOSPITAL_COMMUNITY): Payer: Self-pay | Admitting: Certified Registered Nurse Anesthetist

## 2018-06-30 NOTE — Anesthesia Preprocedure Evaluation (Deleted)
Anesthesia Evaluation    Reviewed: Allergy & Precautions, Patient's Chart, lab work & pertinent test results  History of Anesthesia Complications Negative for: history of anesthetic complications  Airway        Dental   Pulmonary COPD, former smoker,           Cardiovascular hypertension, Pt. on medications      Neuro/Psych Seizures -,  75mm ACOM aneurysm TIAnegative psych ROS   GI/Hepatic Neg liver ROS, PUD, GERD  ,  Endo/Other  negative endocrine ROS  Renal/GU negative Renal ROS  negative genitourinary   Musculoskeletal  (+) Fibromyalgia -  Abdominal   Peds  Hematology negative hematology ROS (+)   Anesthesia Other Findings   Reproductive/Obstetrics                             Anesthesia Physical Anesthesia Plan  ASA: III  Anesthesia Plan: General   Post-op Pain Management:    Induction: Intravenous  PONV Risk Score and Plan: 3 and Ondansetron, Dexamethasone, Midazolam and Treatment may vary due to age or medical condition  Airway Management Planned: Oral ETT  Additional Equipment: Arterial line  Intra-op Plan:   Post-operative Plan: Extubation in OR  Informed Consent:   Plan Discussed with:   Anesthesia Plan Comments:         Anesthesia Quick Evaluation

## 2018-07-01 ENCOUNTER — Inpatient Hospital Stay (HOSPITAL_COMMUNITY): Admission: RE | Admit: 2018-07-01 | Payer: BLUE CROSS/BLUE SHIELD | Source: Ambulatory Visit

## 2018-07-01 ENCOUNTER — Encounter (HOSPITAL_COMMUNITY): Payer: Self-pay

## 2018-07-01 ENCOUNTER — Ambulatory Visit (HOSPITAL_COMMUNITY)
Admission: RE | Admit: 2018-07-01 | Payer: BLUE CROSS/BLUE SHIELD | Source: Ambulatory Visit | Admitting: Interventional Radiology

## 2018-07-01 HISTORY — DX: Essential (primary) hypertension: I10

## 2018-07-01 HISTORY — DX: Transient cerebral ischemic attack, unspecified: G45.9

## 2018-07-01 HISTORY — DX: Anemia, unspecified: D64.9

## 2018-07-01 HISTORY — DX: Unspecified convulsions: R56.9

## 2018-07-01 SURGERY — RADIOLOGY WITH ANESTHESIA
Anesthesia: General

## 2018-07-01 MED ORDER — CEFAZOLIN SODIUM-DEXTROSE 2-4 GM/100ML-% IV SOLN
INTRAVENOUS | Status: AC
  Filled 2018-07-01: qty 100

## 2018-07-07 ENCOUNTER — Encounter (HOSPITAL_COMMUNITY): Payer: Self-pay | Admitting: *Deleted

## 2018-07-07 ENCOUNTER — Other Ambulatory Visit: Payer: Self-pay

## 2018-07-07 NOTE — Progress Notes (Signed)
Brandy Gutierrez reports that her blood pressure has been running low, she checks it 3 times a day; it has been as low as 79/40.  "I felt as weak as water, so I stopped taking Amlodipine, at first I took it every other day, but it was still low"  I asked patient if she spoke with the Dr that prescribed Amlodipine and she had not.  I encouraged patient to call the prescriber and talk to him about the issues she had been having and that she stopped the medication.  Patient said she would call Dr Frankey Poot, [facility] as soon as we get off the phone.  I requested records from Dr Frankey Poot 's office.

## 2018-07-08 ENCOUNTER — Other Ambulatory Visit: Payer: Self-pay | Admitting: Radiology

## 2018-07-08 NOTE — Anesthesia Preprocedure Evaluation (Addendum)
Anesthesia Evaluation  Patient identified by MRN, date of birth, ID band Patient awake    Reviewed: Allergy & Precautions, NPO status , Patient's Chart, lab work & pertinent test results, reviewed documented beta blocker date and time   Airway Mallampati: III  TM Distance: >3 FB Neck ROM: Full    Dental  (+) Chipped,    Pulmonary asthma , COPD,  COPD inhaler, former smoker,    Pulmonary exam normal breath sounds clear to auscultation       Cardiovascular hypertension, Pt. on medications and Pt. on home beta blockers Normal cardiovascular exam Rhythm:Regular Rate:Normal  ECG: NSR, rate 82   Neuro/Psych  Headaches, Seizures -, Well Controlled,  PSYCHIATRIC DISORDERS anteriorly projecting saccular aneurysm in the anterior communicating artery region measuring approximately 7 mm. TIA   GI/Hepatic Neg liver ROS, PUD,   Endo/Other  negative endocrine ROS  Renal/GU negative Renal ROS     Musculoskeletal  (+) Fibromyalgia -  Abdominal   Peds  Hematology negative hematology ROS (+) HLD   Anesthesia Other Findings ANEURYSM  Reproductive/Obstetrics                           Anesthesia Physical Anesthesia Plan  ASA: III  Anesthesia Plan: MAC   Post-op Pain Management:    Induction: Intravenous  PONV Risk Score and Plan: 2 and Ondansetron, Dexamethasone and Treatment may vary due to age or medical condition  Airway Management Planned: Simple Face Mask  Additional Equipment:   Intra-op Plan:   Post-operative Plan:   Informed Consent: I have reviewed the patients History and Physical, chart, labs and discussed the procedure including the risks, benefits and alternatives for the proposed anesthesia with the patient or authorized representative who has indicated his/her understanding and acceptance.   Dental advisory given  Plan Discussed with: CRNA  Anesthesia Plan Comments:         Anesthesia Quick Evaluation

## 2018-07-09 ENCOUNTER — Encounter (HOSPITAL_COMMUNITY)
Admission: AD | Disposition: A | Discharge status: Home Health | Payer: Self-pay | Source: Ambulatory Visit | Attending: Interventional Radiology

## 2018-07-09 ENCOUNTER — Other Ambulatory Visit: Payer: Self-pay

## 2018-07-09 ENCOUNTER — Ambulatory Visit (HOSPITAL_COMMUNITY): Payer: Medicare Other | Admitting: Anesthesiology

## 2018-07-09 ENCOUNTER — Ambulatory Visit (HOSPITAL_COMMUNITY)
Admission: RE | Admit: 2018-07-09 | Discharge: 2018-07-09 | Disposition: A | Discharge status: Home / Self Care | Payer: Medicare Other | Source: Ambulatory Visit | Attending: Interventional Radiology | Admitting: Interventional Radiology

## 2018-07-09 ENCOUNTER — Encounter (HOSPITAL_COMMUNITY): Payer: Self-pay

## 2018-07-09 ENCOUNTER — Inpatient Hospital Stay (HOSPITAL_COMMUNITY)
Admission: AD | Admit: 2018-07-09 | Discharge: 2018-07-11 | DRG: 027 | Disposition: A | Discharge status: Home Health | Payer: Medicare Other | Source: Ambulatory Visit | Attending: Interventional Radiology | Admitting: Interventional Radiology

## 2018-07-09 DIAGNOSIS — Z8541 Personal history of malignant neoplasm of cervix uteri: Secondary | ICD-10-CM

## 2018-07-09 DIAGNOSIS — R52 Pain, unspecified: Secondary | ICD-10-CM | POA: Diagnosis not present

## 2018-07-09 DIAGNOSIS — M21372 Foot drop, left foot: Secondary | ICD-10-CM | POA: Diagnosis present

## 2018-07-09 DIAGNOSIS — Z79899 Other long term (current) drug therapy: Secondary | ICD-10-CM | POA: Diagnosis not present

## 2018-07-09 DIAGNOSIS — J449 Chronic obstructive pulmonary disease, unspecified: Secondary | ICD-10-CM | POA: Diagnosis present

## 2018-07-09 DIAGNOSIS — I671 Cerebral aneurysm, nonruptured: Secondary | ICD-10-CM

## 2018-07-09 DIAGNOSIS — Z7902 Long term (current) use of antithrombotics/antiplatelets: Secondary | ICD-10-CM | POA: Diagnosis not present

## 2018-07-09 DIAGNOSIS — T148XXA Other injury of unspecified body region, initial encounter: Secondary | ICD-10-CM

## 2018-07-09 DIAGNOSIS — I729 Aneurysm of unspecified site: Secondary | ICD-10-CM

## 2018-07-09 DIAGNOSIS — M797 Fibromyalgia: Secondary | ICD-10-CM | POA: Diagnosis present

## 2018-07-09 DIAGNOSIS — Z8673 Personal history of transient ischemic attack (TIA), and cerebral infarction without residual deficits: Secondary | ICD-10-CM

## 2018-07-09 DIAGNOSIS — K219 Gastro-esophageal reflux disease without esophagitis: Secondary | ICD-10-CM | POA: Diagnosis present

## 2018-07-09 DIAGNOSIS — Z7951 Long term (current) use of inhaled steroids: Secondary | ICD-10-CM | POA: Diagnosis not present

## 2018-07-09 DIAGNOSIS — G8929 Other chronic pain: Secondary | ICD-10-CM | POA: Diagnosis present

## 2018-07-09 DIAGNOSIS — Z87891 Personal history of nicotine dependence: Secondary | ICD-10-CM

## 2018-07-09 DIAGNOSIS — I10 Essential (primary) hypertension: Secondary | ICD-10-CM | POA: Diagnosis present

## 2018-07-09 DIAGNOSIS — Z7982 Long term (current) use of aspirin: Secondary | ICD-10-CM | POA: Diagnosis not present

## 2018-07-09 DIAGNOSIS — T81718A Complication of other artery following a procedure, not elsewhere classified, initial encounter: Secondary | ICD-10-CM

## 2018-07-09 HISTORY — PX: RADIOLOGY WITH ANESTHESIA: SHX6223

## 2018-07-09 HISTORY — DX: Unspecified asthma, uncomplicated: J45.909

## 2018-07-09 HISTORY — PX: IR ANGIOGRAM FOLLOW UP STUDY: IMG697

## 2018-07-09 HISTORY — PX: IR TRANSCATH/EMBOLIZ: IMG695

## 2018-07-09 HISTORY — PX: IR ANGIO VERTEBRAL SEL VERTEBRAL BILAT MOD SED: IMG5369

## 2018-07-09 HISTORY — DX: Headache, unspecified: R51.9

## 2018-07-09 HISTORY — PX: IR ANGIO INTRA EXTRACRAN SEL COM CAROTID INNOMINATE UNI L MOD SED: IMG5358

## 2018-07-09 HISTORY — PX: IR ANGIO INTRA EXTRACRAN SEL INTERNAL CAROTID UNI R MOD SED: IMG5362

## 2018-07-09 HISTORY — PX: IR NEURO EACH ADD'L AFTER BASIC UNI RIGHT (MS): IMG5374

## 2018-07-09 HISTORY — DX: Headache: R51

## 2018-07-09 HISTORY — DX: Tremor, unspecified: R25.1

## 2018-07-09 HISTORY — DX: Personal history of urinary calculi: Z87.442

## 2018-07-09 LAB — CBC WITH DIFFERENTIAL/PLATELET
Abs Immature Granulocytes: 0.02 K/uL (ref 0.00–0.07)
Basophils Absolute: 0.1 K/uL (ref 0.0–0.1)
Basophils Relative: 2 %
Eosinophils Absolute: 0.4 K/uL (ref 0.0–0.5)
Eosinophils Relative: 9 %
HCT: 41.2 % (ref 36.0–46.0)
Hemoglobin: 12.8 g/dL (ref 12.0–15.0)
Immature Granulocytes: 1 %
Lymphocytes Relative: 32 %
Lymphs Abs: 1.4 K/uL (ref 0.7–4.0)
MCH: 31.2 pg (ref 26.0–34.0)
MCHC: 31.1 g/dL (ref 30.0–36.0)
MCV: 100.5 fL — ABNORMAL HIGH (ref 80.0–100.0)
Monocytes Absolute: 0.6 K/uL (ref 0.1–1.0)
Monocytes Relative: 14 %
Neutro Abs: 1.9 K/uL (ref 1.7–7.7)
Neutrophils Relative %: 42 %
Platelets: 182 K/uL (ref 150–400)
RBC: 4.1 MIL/uL (ref 3.87–5.11)
RDW: 12.9 % (ref 11.5–15.5)
WBC: 4.3 K/uL (ref 4.0–10.5)
nRBC: 0 % (ref 0.0–0.2)

## 2018-07-09 LAB — BASIC METABOLIC PANEL
ANION GAP: 11 (ref 5–15)
BUN: 18 mg/dL (ref 6–20)
CALCIUM: 9.7 mg/dL (ref 8.9–10.3)
CO2: 22 mmol/L (ref 22–32)
Chloride: 103 mmol/L (ref 98–111)
Creatinine, Ser: 0.95 mg/dL (ref 0.44–1.00)
Glucose, Bld: 93 mg/dL (ref 70–99)
Potassium: 4.3 mmol/L (ref 3.5–5.1)
Sodium: 136 mmol/L (ref 135–145)

## 2018-07-09 LAB — HEPARIN LEVEL (UNFRACTIONATED): HEPARIN UNFRACTIONATED: 0.19 [IU]/mL — AB (ref 0.30–0.70)

## 2018-07-09 LAB — PROTIME-INR
INR: 0.92
Prothrombin Time: 12.3 s (ref 11.4–15.2)

## 2018-07-09 LAB — PLATELET INHIBITION P2Y12: Platelet Function  P2Y12: 63 [PRU] — ABNORMAL LOW (ref 194–418)

## 2018-07-09 LAB — POCT ACTIVATED CLOTTING TIME
ACTIVATED CLOTTING TIME: 186 s
Activated Clotting Time: 257 seconds

## 2018-07-09 LAB — MRSA PCR SCREENING: MRSA BY PCR: NEGATIVE

## 2018-07-09 SURGERY — RADIOLOGY WITH ANESTHESIA
Anesthesia: Monitor Anesthesia Care

## 2018-07-09 MED ORDER — CLOPIDOGREL BISULFATE 75 MG PO TABS: 75.0000 mg | ORAL_TABLET | Freq: Every day | ORAL | Status: DC

## 2018-07-09 MED ORDER — ONDANSETRON HCL 4 MG/2ML IJ SOLN
INTRAMUSCULAR | Status: DC | PRN
  Administered 2018-07-09: 4 mg via INTRAVENOUS

## 2018-07-09 MED ORDER — IBUPROFEN 200 MG PO TABS: 800.0000 mg | ORAL_TABLET | Freq: Three times a day (TID) | ORAL | Status: DC | PRN

## 2018-07-09 MED ORDER — SODIUM CHLORIDE 0.9 % IV SOLN
INTRAVENOUS | Status: DC | PRN
  Administered 2018-07-09: 20 ug/min via INTRAVENOUS

## 2018-07-09 MED ORDER — LIDOCAINE HCL 1 % IJ SOLN
INTRAMUSCULAR | Status: AC
  Filled 2018-07-09: qty 20

## 2018-07-09 MED ORDER — IOPAMIDOL (ISOVUE-300) INJECTION 61%
INTRAVENOUS | Status: AC
  Filled 2018-07-09: qty 100

## 2018-07-09 MED ORDER — FENTANYL CITRATE (PF) 100 MCG/2ML IJ SOLN
INTRAMUSCULAR | Status: DC | PRN
  Administered 2018-07-09: 25 ug via INTRAVENOUS
  Administered 2018-07-09: 50 ug via INTRAVENOUS
  Administered 2018-07-09: 25 ug via INTRAVENOUS

## 2018-07-09 MED ORDER — HEPARIN (PORCINE) 25000 UT/250ML-% IV SOLN
INTRAVENOUS | Status: AC
  Administered 2018-07-09: 15:00:00
  Filled 2018-07-09: qty 250

## 2018-07-09 MED ORDER — LIDOCAINE 20MG/ML (2%) 15 ML SYRINGE OPTIME
INTRAMUSCULAR | Status: DC | PRN
  Administered 2018-07-09: 30 mg via INTRAVENOUS

## 2018-07-09 MED ORDER — VITAMIN D3 25 MCG (1000 UNIT) PO TABS
1000.0000 [IU] | ORAL_TABLET | Freq: Every day | ORAL | Status: DC
  Administered 2018-07-10 – 2018-07-11 (×2): 1000 [IU] via ORAL
  Filled 2018-07-09 (×4): qty 1

## 2018-07-09 MED ORDER — ONDANSETRON HCL 4 MG/2ML IJ SOLN: 4.0000 mg | Freq: Four times a day (QID) | INTRAMUSCULAR | Status: DC | PRN

## 2018-07-09 MED ORDER — ROCURONIUM BROMIDE 10 MG/ML (PF) SYRINGE
PREFILLED_SYRINGE | INTRAVENOUS | Status: DC | PRN
  Administered 2018-07-09 (×2): 20 mg via INTRAVENOUS
  Administered 2018-07-09: 30 mg via INTRAVENOUS

## 2018-07-09 MED ORDER — HEPARIN SODIUM (PORCINE) 1000 UNIT/ML IJ SOLN
INTRAMUSCULAR | Status: DC | PRN
  Administered 2018-07-09: 500 [IU] via INTRAVENOUS
  Administered 2018-07-09: 1000 [IU] via INTRAVENOUS
  Administered 2018-07-09: 3000 [IU] via INTRAVENOUS

## 2018-07-09 MED ORDER — CLEVIDIPINE BUTYRATE 0.5 MG/ML IV EMUL: 1.0000 mg/h | INTRAVENOUS | Status: DC

## 2018-07-09 MED ORDER — NIMODIPINE 30 MG PO CAPS: 0.0000 mg | ORAL_CAPSULE | ORAL | Status: DC

## 2018-07-09 MED ORDER — GABAPENTIN 600 MG PO TABS
600.0000 mg | ORAL_TABLET | Freq: Four times a day (QID) | ORAL | Status: DC
  Administered 2018-07-09 – 2018-07-11 (×6): 600 mg via ORAL
  Filled 2018-07-09 (×7): qty 1

## 2018-07-09 MED ORDER — BIOTIN 10000 MCG PO TABS: 10000.0000 ug | ORAL_TABLET | Freq: Two times a day (BID) | ORAL | Status: DC

## 2018-07-09 MED ORDER — AMITRIPTYLINE HCL 10 MG PO TABS
10.0000 mg | ORAL_TABLET | Freq: Every day | ORAL | Status: DC
  Administered 2018-07-09 – 2018-07-10 (×2): 10 mg via ORAL
  Filled 2018-07-09 (×2): qty 1

## 2018-07-09 MED ORDER — COLESTIPOL HCL 1 G PO TABS
0.5000 g | ORAL_TABLET | Freq: Two times a day (BID) | ORAL | Status: DC
  Administered 2018-07-09: 0.5 g via ORAL
  Filled 2018-07-09 (×2): qty 1

## 2018-07-09 MED ORDER — SODIUM CHLORIDE 0.9 % IV SOLN
INTRAVENOUS | Status: DC
  Administered 2018-07-09 (×3): via INTRAVENOUS
  Administered 2018-07-10: 1000 mL via INTRAVENOUS

## 2018-07-09 MED ORDER — FERROUS SULFATE 325 (65 FE) MG PO TABS
325.0000 mg | ORAL_TABLET | Freq: Two times a day (BID) | ORAL | Status: DC
  Administered 2018-07-09 – 2018-07-11 (×4): 325 mg via ORAL
  Filled 2018-07-09 (×4): qty 1

## 2018-07-09 MED ORDER — FENTANYL CITRATE (PF) 100 MCG/2ML IJ SOLN
12.5000 ug | INTRAMUSCULAR | Status: DC | PRN
  Administered 2018-07-09 – 2018-07-11 (×6): 12.5 ug via INTRAVENOUS
  Filled 2018-07-09 (×6): qty 2

## 2018-07-09 MED ORDER — IPRATROPIUM-ALBUTEROL 0.5-2.5 (3) MG/3ML IN SOLN: 3.0000 mL | Freq: Four times a day (QID) | RESPIRATORY_TRACT | Status: DC | PRN

## 2018-07-09 MED ORDER — LIDOCAINE HCL (PF) 1 % IJ SOLN
INTRAMUSCULAR | Status: DC | PRN
  Administered 2018-07-09: 10 mL

## 2018-07-09 MED ORDER — KETOROLAC TROMETHAMINE 30 MG/ML IJ SOLN
30.0000 mg | Freq: Four times a day (QID) | INTRAMUSCULAR | Status: DC | PRN
  Administered 2018-07-09 – 2018-07-10 (×3): 30 mg via INTRAVENOUS
  Filled 2018-07-09 (×2): qty 1

## 2018-07-09 MED ORDER — ALBUTEROL SULFATE HFA 108 (90 BASE) MCG/ACT IN AERS: 2.0000 | INHALATION_SPRAY | Freq: Four times a day (QID) | RESPIRATORY_TRACT | Status: DC | PRN

## 2018-07-09 MED ORDER — PHENYLEPHRINE 40 MCG/ML (10ML) SYRINGE FOR IV PUSH (FOR BLOOD PRESSURE SUPPORT)
PREFILLED_SYRINGE | INTRAVENOUS | Status: DC | PRN
  Administered 2018-07-09: 80 ug via INTRAVENOUS

## 2018-07-09 MED ORDER — MIRTAZAPINE 15 MG PO TABS
15.0000 mg | ORAL_TABLET | Freq: Every day | ORAL | Status: DC
  Administered 2018-07-09 – 2018-07-10 (×2): 15 mg via ORAL
  Filled 2018-07-09 (×2): qty 1

## 2018-07-09 MED ORDER — PROPOFOL 10 MG/ML IV BOLUS
INTRAVENOUS | Status: DC | PRN
  Administered 2018-07-09: 100 mg via INTRAVENOUS

## 2018-07-09 MED ORDER — ACETAMINOPHEN 325 MG PO TABS
650.0000 mg | ORAL_TABLET | ORAL | Status: DC | PRN
  Administered 2018-07-10 – 2018-07-11 (×4): 650 mg via ORAL
  Filled 2018-07-09 (×4): qty 2

## 2018-07-09 MED ORDER — ASPIRIN EC 325 MG PO TBEC: 325.0000 mg | DELAYED_RELEASE_TABLET | ORAL | Status: DC

## 2018-07-09 MED ORDER — HEPARIN (PORCINE) 25000 UT/250ML-% IV SOLN
500.0000 [IU]/h | INTRAVENOUS | Status: DC
  Administered 2018-07-09: 500 [IU]/h via INTRAVENOUS

## 2018-07-09 MED ORDER — CLEVIDIPINE BUTYRATE 0.5 MG/ML IV EMUL
INTRAVENOUS | Status: AC
  Filled 2018-07-09: qty 50

## 2018-07-09 MED ORDER — AMLODIPINE BESYLATE 2.5 MG PO TABS
2.5000 mg | ORAL_TABLET | Freq: Every day | ORAL | Status: DC
  Administered 2018-07-11: 2.5 mg via ORAL
  Filled 2018-07-09 (×2): qty 1

## 2018-07-09 MED ORDER — ASPIRIN 81 MG PO CHEW
243.0000 mg | CHEWABLE_TABLET | Freq: Once | ORAL | Status: DC
  Filled 2018-07-09: qty 3

## 2018-07-09 MED ORDER — ASPIRIN 325 MG PO TABS
325.0000 mg | ORAL_TABLET | Freq: Every day | ORAL | Status: DC
  Administered 2018-07-10 – 2018-07-11 (×2): 325 mg via ORAL
  Filled 2018-07-09 (×2): qty 1

## 2018-07-09 MED ORDER — MIDAZOLAM HCL 5 MG/5ML IJ SOLN
INTRAMUSCULAR | Status: DC | PRN
  Administered 2018-07-09 (×2): 1 mg via INTRAVENOUS

## 2018-07-09 MED ORDER — ACETAMINOPHEN 650 MG RE SUPP: 650.0000 mg | RECTAL | Status: DC | PRN

## 2018-07-09 MED ORDER — CLEVIDIPINE BUTYRATE 0.5 MG/ML IV EMUL: 0.0000 mg/h | INTRAVENOUS | Status: AC

## 2018-07-09 MED ORDER — SUGAMMADEX SODIUM 200 MG/2ML IV SOLN
INTRAVENOUS | Status: DC | PRN
  Administered 2018-07-09: 101.6 mg via INTRAVENOUS

## 2018-07-09 MED ORDER — ACETAMINOPHEN 160 MG/5ML PO SOLN: 650.0000 mg | ORAL | Status: DC | PRN

## 2018-07-09 MED ORDER — PROPRANOLOL HCL 20 MG PO TABS
20.0000 mg | ORAL_TABLET | Freq: Two times a day (BID) | ORAL | Status: DC
  Administered 2018-07-09 – 2018-07-11 (×4): 20 mg via ORAL
  Filled 2018-07-09 (×4): qty 1

## 2018-07-09 MED ORDER — CEFAZOLIN SODIUM-DEXTROSE 2-4 GM/100ML-% IV SOLN
INTRAVENOUS | Status: AC
  Filled 2018-07-09: qty 100

## 2018-07-09 MED ORDER — LAMOTRIGINE 25 MG PO TABS
50.0000 mg | ORAL_TABLET | Freq: Two times a day (BID) | ORAL | Status: DC
  Administered 2018-07-09 – 2018-07-11 (×4): 50 mg via ORAL
  Filled 2018-07-09 (×4): qty 2

## 2018-07-09 MED ORDER — PROTAMINE SULFATE 10 MG/ML IV SOLN
INTRAVENOUS | Status: DC | PRN
  Administered 2018-07-09 (×2): 2.5 mg via INTRAVENOUS
  Administered 2018-07-09: 5 mg via INTRAVENOUS

## 2018-07-09 MED ORDER — CLOPIDOGREL BISULFATE 75 MG PO TABS
75.0000 mg | ORAL_TABLET | Freq: Every day | ORAL | Status: DC
  Administered 2018-07-10 – 2018-07-11 (×2): 75 mg via ORAL
  Filled 2018-07-09 (×2): qty 1

## 2018-07-09 MED ORDER — NITROGLYCERIN 1 MG/10 ML FOR IR/CATH LAB
INTRA_ARTERIAL | Status: AC
  Filled 2018-07-09: qty 10

## 2018-07-09 MED ORDER — VITAMIN B-1 100 MG PO TABS
100.0000 mg | ORAL_TABLET | Freq: Every day | ORAL | Status: DC
  Administered 2018-07-10 – 2018-07-11 (×2): 100 mg via ORAL
  Filled 2018-07-09 (×2): qty 1

## 2018-07-09 MED ORDER — LORATADINE 10 MG PO TABS
10.0000 mg | ORAL_TABLET | Freq: Every day | ORAL | Status: DC
  Administered 2018-07-10 – 2018-07-11 (×2): 10 mg via ORAL
  Filled 2018-07-09 (×2): qty 1

## 2018-07-09 MED ORDER — CEFAZOLIN SODIUM-DEXTROSE 2-4 GM/100ML-% IV SOLN
2.0000 g | INTRAVENOUS | Status: AC
  Administered 2018-07-09: 2 g via INTRAVENOUS

## 2018-07-09 MED ORDER — CLEVIDIPINE BUTYRATE 0.5 MG/ML IV EMUL
INTRAVENOUS | Status: DC | PRN
  Administered 2018-07-09: 2 mg/h via INTRAVENOUS

## 2018-07-09 MED ORDER — ROSUVASTATIN CALCIUM 20 MG PO TABS
20.0000 mg | ORAL_TABLET | Freq: Every day | ORAL | Status: DC
  Administered 2018-07-09 – 2018-07-10 (×2): 20 mg via ORAL
  Filled 2018-07-09 (×2): qty 1

## 2018-07-09 MED ORDER — ADULT MULTIVITAMIN W/MINERALS CH
1.0000 | ORAL_TABLET | Freq: Every day | ORAL | Status: DC
  Administered 2018-07-09 – 2018-07-10 (×2): 1 via ORAL
  Filled 2018-07-09 (×2): qty 1

## 2018-07-09 MED ORDER — CLOPIDOGREL BISULFATE 75 MG PO TABS: 75.0000 mg | ORAL_TABLET | ORAL | Status: DC

## 2018-07-09 MED ORDER — KETOROLAC TROMETHAMINE 30 MG/ML IJ SOLN
INTRAMUSCULAR | Status: AC
  Filled 2018-07-09: qty 1

## 2018-07-09 MED ORDER — SODIUM CHLORIDE 0.9 % IV SOLN
INTRAVENOUS | Status: DC
  Administered 2018-07-09 (×3): via INTRAVENOUS

## 2018-07-09 MED ORDER — FOLIC ACID 1 MG PO TABS
1.0000 mg | ORAL_TABLET | Freq: Every day | ORAL | Status: DC
  Administered 2018-07-10 – 2018-07-11 (×2): 1 mg via ORAL
  Filled 2018-07-09 (×2): qty 1

## 2018-07-09 MED ORDER — ASPIRIN 81 MG PO CHEW: 324.0000 mg | CHEWABLE_TABLET | Freq: Every day | ORAL | Status: DC

## 2018-07-09 NOTE — Procedures (Signed)
S/P 4 vessel cerebral arteriogram RT CFA approach. Findings./ 1.Approx 32mm x 4 mm ACOM aneurysm  S/P stent assisted coiling  With near complete obliteration.

## 2018-07-09 NOTE — Progress Notes (Signed)
Per IR- sheath pulled at 1155.

## 2018-07-09 NOTE — Sedation Documentation (Signed)
Deveshwar, MD has entered IR suite 2. Patient remains under care of CRNA.

## 2018-07-09 NOTE — Sedation Documentation (Signed)
Patient transferred to PACU room 10. Report, pulses, and site check done with PACU RN at bedside.

## 2018-07-09 NOTE — Progress Notes (Signed)
Referring Physician(s): Dr Arlyce Harman Jannifer Franklin  Supervising Physician: Luanne Bras  Patient Status:  [facility] - In-pt  Chief Complaint:  RT CFA approach. Findings./ 1.Approx 1mm x 4 mm ACOM aneurysm  S/P stent assisted coiling  With near complete obliteration.  Subjective:  Complaining of overall body pain Hx fibromyalgia Back hurts most Denies weakness; numbness or tingling Lying flat another 15 minutes  Allergies: Sulfa antibiotics; Metronidazole; and Cymbalta [duloxetine hcl]  Medications: Prior to Admission medications   Medication Sig Start Date End Date Taking? Authorizing Provider  albuterol (PROAIR HFA) 108 (90 Base) MCG/ACT inhaler Inhale 2 puffs into the lungs every 6 (six) hours as needed for wheezing or shortness of breath.   Yes [provider]  amitriptyline (ELAVIL) 10 MG tablet Take 10 mg by mouth at bedtime. 05/19/18  Yes [provider]  amLODipine (NORVASC) 2.5 MG tablet Take 2.5 mg by mouth daily.  04/28/18  Yes [provider]  aspirin EC 81 MG tablet Take 81 mg by mouth daily.   Yes [provider]  Biotin 10000 MCG TABS Take 10,000 mcg by mouth 2 (two) times daily.   Yes [provider]  Cholecalciferol (VITAMIN D-3) 1000 units CAPS Take 1,000 Units by mouth daily.    Yes [provider]  clopidogrel (PLAVIX) 75 MG tablet Take 75 mg by mouth daily. 06/04/18  Yes [provider]  colestipol (COLESTID) 1 g tablet Take 0.5 g by mouth 2 (two) times daily. 05/06/18  Yes [provider]  ferrous sulfate 325 (65 FE) MG tablet Take 325 mg by mouth 2 (two) times daily.    Yes [provider]  fexofenadine (ALLEGRA ALLERGY) 180 MG tablet Take 180 mg by mouth daily.   Yes [provider]  folic acid (FOLVITE) 1 MG tablet Take 1 mg by mouth daily. 03/31/14  Yes [provider]  gabapentin (NEURONTIN) 600 MG tablet Take 600 mg by mouth 4 (four) times daily.   Yes [provider]  ibuprofen (ADVIL,MOTRIN) 800 MG tablet Take 800 mg by mouth 3 (three) times daily as needed for pain. 05/06/18  Yes [provider]  ipratropium-albuterol (DUONEB) 0.5-2.5 (3) MG/3ML SOLN Take 3 mLs by nebulization every 6 (six) hours as needed (for wheezing/shortness of breath.).   Yes [provider]  lamoTRIgine (LAMICTAL) 25 MG tablet Take 50 mg by mouth 2 (two) times daily.    Yes [provider]  mirtazapine (REMERON) 15 MG tablet Take 15 mg by mouth at bedtime.   Yes [provider]  Multiple Vitamin (MULTIVITAMIN WITH MINERALS) TABS tablet Take 1 tablet by mouth daily at 8 pm.   Yes [provider]  propranolol (INDERAL) 20 MG tablet Take 20 mg by mouth 2 (two) times daily. 06/03/18  Yes [provider]  rosuvastatin (CRESTOR) 20 MG tablet Take 20 mg by mouth at bedtime. 05/01/18  Yes [provider]  thiamine (VITAMIN B-1) 100 MG tablet Take 100 mg by mouth daily.   Yes [provider]  cyanocobalamin (,VITAMIN B-12,) 1000 MCG/ML injection Inject 1,000 mcg into the muscle every 30 (thirty) days.    [provider]     Vital Signs: BP 128/84   Pulse 86   Temp 97.6 F (36.4 C) (Oral)   Resp 17   Ht 5' (1.524 m)   Wt 112 lb 3.4 oz (50.9 kg)   SpO2 92%   BMI 21.92 kg/m   Physical Exam  Constitutional: She is  oriented to person, place, and time.  Face symmetrical Tongue midline  Eyes: Pupils are equal, round, and reactive to light. EOM are normal.  Neck: Normal range of motion.  Musculoskeletal: Normal range of motion.  Rt plantar and dorsal flexion wnl Left plantar flexion 4+/5 Left dorsi flexion 3+/5  Neurological: She is alert and oriented to person, place, and time.  Skin: Skin is warm and dry.  Right groin NT no bleeding no hematoma Bilat feet pulses 2+   Vitals reviewed.   Imaging: No results found.  Labs:  CBC: Recent Labs    02/05/18 1007 07/09/18 0631  WBC  7.0 4.3  HGB 13.4 12.8  HCT 42.2 41.2  PLT 295 182    COAGS: Recent Labs    07/09/18 0631  INR 0.92    BMP: Recent Labs    02/05/18 1007 07/09/18 0631  NA 141 136  K 5.4* 4.3  CL 99 103  CO2 24 22  GLUCOSE 85 93  BUN 15 18  CALCIUM 10.5* 9.7  CREATININE 0.72 0.95  GFRNONAA 94 >60  GFRAA 108 >60    LIVER FUNCTION TESTS: Recent Labs    02/05/18 1007  BILITOT 0.3  AST 22  ALT 14  ALKPHOS 61  PROT 7.5  ALBUMIN 4.7    Assessment and Plan:  ACOM aneurysm coiling/stent placement Left foot minimal weakness noted Will see in am We have restarted home meds for pain  Electronically Signed: Alasia Enge A, PA-C 07/09/2018, 4:48 PM   I spent a total of 25 Minutes at the the patient's bedside AND on the patient's hospital floor or unit, greater than 50% of which was counseling/coordinating care for [facility] aneurysm embolization

## 2018-07-09 NOTE — Anesthesia Postprocedure Evaluation (Signed)
Anesthesia Post Note  Patient: Brandy Gutierrez  Procedure(s) Performed: RADIOLOGY WITH ANESTHESIA  EMBOLIZATION (N/A )     Patient location during evaluation: PACU Anesthesia Type: General Level of consciousness: awake and alert Pain management: pain level controlled Vital Signs Assessment: post-procedure vital signs reviewed and stable Respiratory status: spontaneous breathing, nonlabored ventilation, respiratory function stable and patient connected to nasal cannula oxygen Cardiovascular status: blood pressure returned to baseline and stable Postop Assessment: no apparent nausea or vomiting Anesthetic complications: no    Last Vitals:  Vitals:   07/09/18 1430 07/09/18 1500  BP:  128/84  Pulse: 86   Resp: 17 17  Temp: 36.4 C   SpO2: 95% 92%    Last Pain:  Vitals:   07/09/18 1430  TempSrc: Oral  PainSc:                  Ryan P Ellender

## 2018-07-09 NOTE — Sedation Documentation (Signed)
Patient arrived in IR suite 2. Patient under care of CRNA with this RN in room to assist as needed.

## 2018-07-09 NOTE — Transfer of Care (Signed)
Immediate Anesthesia Transfer of Care Note  Patient: Brandy Gutierrez  Procedure(s) Performed: RADIOLOGY WITH ANESTHESIA  EMBOLIZATION (N/A )  Patient Location: PACU  Anesthesia Type:General  Level of Consciousness: awake, alert , oriented and patient cooperative  Airway & Oxygen Therapy: Patient Spontanous Breathing and Patient connected to nasal cannula oxygen  Post-op Assessment: Report given to RN, Post -op Vital signs reviewed and stable, Patient moving all extremities X 4 and Patient able to stick tongue midline  Post vital signs: Reviewed and stable  Last Vitals:  Vitals Value Taken Time  BP    Temp    Pulse    Resp    SpO2      Last Pain:  Vitals:   07/09/18 0650  TempSrc:   PainSc: 0-No pain         Complications: No apparent anesthesia complications

## 2018-07-09 NOTE — Sedation Documentation (Signed)
Deveshwar, MD has left IR suite 2. Patient remains under care of CRNA

## 2018-07-09 NOTE — Anesthesia Procedure Notes (Addendum)
Arterial Line Insertion Start/End11/21/2019 10:30 AM, 07/09/2018 10:34 AM Performed by: Verdie Drown, CRNA, CRNA  Patient location: OOR procedure area. Preanesthetic checklist: patient identified, IV checked, site marked, risks and benefits discussed, surgical consent, monitors and equipment checked, pre-op evaluation, timeout performed and anesthesia consent Lidocaine 1% used for infiltration and patient sedated Left, radial was placed Catheter size: 20 G Hand hygiene performed  and maximum sterile barriers used   Attempts: 1 Procedure performed without using ultrasound guided technique. Following insertion, dressing applied and Biopatch. Post procedure assessment: normal  Patient tolerated the procedure well with no immediate complications. Additional procedure comments: Risks and benefits, consent discussed in holding area pre-procedure before sedation given.Marland Kitchen

## 2018-07-09 NOTE — Progress Notes (Addendum)
ANTICOAGULATION CONSULT NOTE - Initial Consult  Pharmacy Consult for heparin  Indication: chest pain/ACS  Allergies  Allergen Reactions  . Sulfa Antibiotics Hives and Shortness Of Breath  . Metronidazole Other (See Comments)    Unknown reaction type  . Cymbalta [Duloxetine Hcl] Other (See Comments)    Confusion    Patient Measurements: Height: 5' (152.4 cm) Weight: 112 lb 3.4 oz (50.9 kg) IBW/kg (Calculated) : 45.5   Vital Signs: Temp: 97.9 F (36.6 C) (11/21 0623) Temp Source: Oral (11/21 0623) BP: 133/80 (11/21 1421) Pulse Rate: 82 (11/21 1348)  Labs: Recent Labs    07/09/18 0631  HGB 12.8  HCT 41.2  PLT 182  LABPROT 12.3  INR 0.92  CREATININE 0.95    Estimated Creatinine Clearance: 47.5 mL/min (by C-G formula based on SCr of 0.95 mg/dL).   Medical History: Past Medical History:  Diagnosis Date  . Abnormality of gait 09/10/2016  . Anemia   . Anterior cerebral aneurysm 08/06/2017  . Asthma due to seasonal allergies   . Cervical cancer (HCC)    dx at age 23  . Chronic insomnia   . COPD (chronic obstructive pulmonary disease) ([place])   . Fibromyalgia   . GERD (gastroesophageal reflux disease)   . Headache    "everyday" - for years   . History of kidney stones    passed  . Hypertension   . Memory disorder 04/08/2014  . Pneumonia   . Seizure ([place])   . Somnambulism   . Stomach ulcer   . TIA (transient ischemic attack)    x 6  . Tremor   . Vitamin B12 deficiency     Medications:  Medications Prior to Admission  Medication Sig Dispense Refill Last Dose  . albuterol (PROAIR HFA) 108 (90 Base) MCG/ACT inhaler Inhale 2 puffs into the lungs every 6 (six) hours as needed for wheezing or shortness of breath.   07/08/2018 at Unknown time  . amitriptyline (ELAVIL) 10 MG tablet Take 10 mg by mouth at bedtime.  0 07/09/2018 at 0345  . amLODipine (NORVASC) 2.5 MG tablet Take 2.5 mg by mouth daily.   0 07/09/2018 at 0345  . aspirin EC 81 MG tablet Take 81 mg by  mouth daily.   07/09/2018 at [place]  . Biotin 10000 MCG TABS Take 10,000 mcg by mouth 2 (two) times daily.   Past Month at Unknown time  . Cholecalciferol (VITAMIN D-3) 1000 units CAPS Take 1,000 Units by mouth daily.    Past Month at Unknown time  . clopidogrel (PLAVIX) 75 MG tablet Take 75 mg by mouth daily.  3 07/09/2018 at [place]  . colestipol (COLESTID) 1 g tablet Take 0.5 g by mouth 2 (two) times daily.  3 07/08/2018 at Unknown time  . ferrous sulfate 325 (65 FE) MG tablet Take 325 mg by mouth 2 (two) times daily.    07/09/2018 at [place]  . fexofenadine (ALLEGRA ALLERGY) 180 MG tablet Take 180 mg by mouth daily.   07/09/2018 at [place]  . folic acid (FOLVITE) 1 MG tablet Take 1 mg by mouth daily.   07/09/2018 at [place]  . gabapentin (NEURONTIN) 600 MG tablet Take 600 mg by mouth 4 (four) times daily.   07/09/2018 at [place]  . ibuprofen (ADVIL,MOTRIN) 800 MG tablet Take 800 mg by mouth 3 (three) times daily as needed for pain.  0 07/09/2018 at 0345  . ipratropium-albuterol (DUONEB) 0.5-2.5 (3) MG/3ML SOLN Take 3 mLs by nebulization every 6 (six) hours as needed (  for wheezing/shortness of breath.).   07/09/2018 at [place]  . lamoTRIgine (LAMICTAL) 25 MG tablet Take 50 mg by mouth 2 (two) times daily.    07/09/2018 at [place]  . mirtazapine (REMERON) 15 MG tablet Take 15 mg by mouth at bedtime.   07/08/2018 at Unknown time  . Multiple Vitamin (MULTIVITAMIN WITH MINERALS) TABS tablet Take 1 tablet by mouth daily at 8 pm.   Past Month at Unknown time  . propranolol (INDERAL) 20 MG tablet Take 20 mg by mouth 2 (two) times daily.  1 07/09/2018 at [place]  . rosuvastatin (CRESTOR) 20 MG tablet Take 20 mg by mouth at bedtime.  1 07/08/2018 at Unknown time  . thiamine (VITAMIN B-1) 100 MG tablet Take 100 mg by mouth daily.   Past Month at Unknown time  . cyanocobalamin (,VITAMIN B-12,) 1000 MCG/ML injection Inject 1,000 mcg into the muscle every 30 (thirty) days.   More than a month at Unknown time   Scheduled:  . [START  ON 07/10/2018] aspirin  325 mg Oral Daily   Or  . [START ON 07/10/2018] aspirin  324 mg Per Tube Daily  . [START ON 07/10/2018] clopidogrel  75 mg Oral Daily   Or  . [START ON 07/10/2018] clopidogrel  75 mg Per Tube Daily  . nitroGLYCERIN        Assessment: 57 yo female s/p stent assisted coiling on heparin at 500 units/hr. Heparin to run until 11/22 at 8am -wt= 50kg -hg= 12.8, plt= 182  Goal of Therapy:  Heparin level 0.1-0.25 units/ml Monitor platelets by anticoagulation protocol: Yes   Plan:  -No heparin changes needed -Heparin level in 8 hours and daily wth CBC daily  Hildred Laser, PharmD Clinical Pharmacist **Pharmacist phone directory can now be found on [email or web address] ([email or web address]).  Listed under [place].   addendum -heparin level= 0.19 and at goal  Plan -No heparin changes needed -Heparin level in am -heparin to d/c at 8am on 11/22  Hildred Laser, PharmD Clinical Pharmacist **Pharmacist phone directory can now be found on [email or web address] ([email or web address]).  Listed under [place].

## 2018-07-09 NOTE — H&P (Signed)
Chief Complaint: Patient was seen in consultation today for cerebral arteriogram with possible anterior communicating artery aneurysm embolization at the request of Dr Neldon Newport  Supervising Physician: Luanne Bras  Patient Status: [facility] - Out-pt  History of Present Illness: Brandy Gutierrez is a 57 y.o. female   Hx chronic pain and fibromyalgia Suffered first time seizure Jun 18, 2017 To [facility] ED Transferred to [facility]-- treated with [place] for 4-5 weeks The patient underwent head scan evaluation and was found to have evidence of a saccular aneurysm involving the anterior communicating artery, this was confirmed with a CT angiogram evaluation.  Was referred to Dr Estanislado Pandy initially 07/2017 Was determined a candidate for cerebral aneurysm embolization Dr Estanislado Pandy note 08/08/17: This may entail primary coiling, stent assisted coiling versus less likely possibility of a flow diverter device. The risk of the procedure of 1% complication and remote risk of intracranial rupture at the time of treatment with the fatality were also reviewed. Questions were answered to their satisfaction.  But Dr Estanislado Pandy felt she needed to gain some weight before procedure She now weighs 112 lbs Has been scheduled today for arteriogram with possible embolization of anterior communicating artery aneurysm    Past Medical History:  Diagnosis Date  . Abnormality of gait 09/10/2016  . Anemia   . Anterior cerebral aneurysm 08/06/2017  . Asthma due to seasonal allergies   . Cervical cancer (HCC)    dx at age 40  . Chronic insomnia   . COPD (chronic obstructive pulmonary disease) ([place])   . Fibromyalgia   . GERD (gastroesophageal reflux disease)   . Headache    "everyday" - for years   . History of kidney stones    passed  . Hypertension   . Memory disorder 04/08/2014  . Pneumonia   . Seizure ([place])   . Somnambulism   . Stomach ulcer   . TIA (transient ischemic  attack)    x 6  . Tremor   . Vitamin B12 deficiency     Past Surgical History:  Procedure Laterality Date  . APPENDECTOMY     taken out with hysterectomy  . CHOLECYSTECTOMY    . COLONOSCOPY    . IR RADIOLOGIST EVAL & MGMT  08/08/2017  . SALPINGOOPHORECTOMY     age 33 for cervical cancer  . TOTAL ABDOMINAL HYSTERECTOMY     age 2 for cervical cancer    Allergies: Sulfa antibiotics; Metronidazole; and Cymbalta [duloxetine hcl]  Medications: Prior to Admission medications   Medication Sig Start Date End Date Taking? Authorizing Provider  albuterol (PROAIR HFA) 108 (90 Base) MCG/ACT inhaler Inhale 2 puffs into the lungs every 6 (six) hours as needed for wheezing or shortness of breath.   Yes [provider]  amitriptyline (ELAVIL) 10 MG tablet Take 10 mg by mouth at bedtime. 05/19/18  Yes [provider]  amLODipine (NORVASC) 2.5 MG tablet Take 2.5 mg by mouth daily.  04/28/18  Yes [provider]  aspirin EC 81 MG tablet Take 81 mg by mouth daily.   Yes [provider]  Biotin 10000 MCG TABS Take 10,000 mcg by mouth 2 (two) times daily.   Yes [provider]  Cholecalciferol (VITAMIN D-3) 1000 units CAPS Take 1,000 Units by mouth daily.    Yes [provider]  clopidogrel (PLAVIX) 75 MG tablet Take 75 mg by mouth daily. 06/04/18  Yes [provider]  colestipol (COLESTID) 1 g tablet Take 0.5 g by  mouth 2 (two) times daily. 05/06/18  Yes [provider]  ferrous sulfate 325 (65 FE) MG tablet Take 325 mg by mouth 2 (two) times daily.    Yes [provider]  fexofenadine (ALLEGRA ALLERGY) 180 MG tablet Take 180 mg by mouth daily.   Yes [provider]  folic acid (FOLVITE) 1 MG tablet Take 1 mg by mouth daily. 03/31/14  Yes [provider]  gabapentin (NEURONTIN) 600 MG tablet Take 600 mg by mouth 4 (four) times daily.   Yes [provider]  ibuprofen (ADVIL,MOTRIN) 800 MG tablet  Take 800 mg by mouth 3 (three) times daily as needed for pain. 05/06/18  Yes [provider]  ipratropium-albuterol (DUONEB) 0.5-2.5 (3) MG/3ML SOLN Take 3 mLs by nebulization every 6 (six) hours as needed (for wheezing/shortness of breath.).   Yes [provider]  lamoTRIgine (LAMICTAL) 25 MG tablet Take 50 mg by mouth 2 (two) times daily.    Yes [provider]  mirtazapine (REMERON) 15 MG tablet Take 15 mg by mouth at bedtime.   Yes [provider]  Multiple Vitamin (MULTIVITAMIN WITH MINERALS) TABS tablet Take 1 tablet by mouth daily at 8 pm.   Yes [provider]  propranolol (INDERAL) 20 MG tablet Take 20 mg by mouth 2 (two) times daily. 06/03/18  Yes [provider]  rosuvastatin (CRESTOR) 20 MG tablet Take 20 mg by mouth at bedtime. 05/01/18  Yes [provider]  thiamine (VITAMIN B-1) 100 MG tablet Take 100 mg by mouth daily.   Yes [provider]  cyanocobalamin (,VITAMIN B-12,) 1000 MCG/ML injection Inject 1,000 mcg into the muscle every 30 (thirty) days.    [provider]     Family History  Problem Relation Age of Onset  . Cancer Mother 80       breast  . Cancer Maternal Aunt 64       breast  . Cancer Maternal Uncle 54       colon  . Cancer Maternal Grandmother 2       breast  . Stroke Maternal Grandmother   . Cancer Maternal Aunt 40       unknown type of cancer  . Cancer Maternal Uncle 68       GI cancer (? pancreatic)  . Cancer Maternal Aunt 104       breast  . Cancer Father   . Hypertension Sister   . Hypertension Brother   . Cancer Maternal Grandfather 65       throat - smoker  . Cancer Cousin 22       breast  . Cancer Cousin 35       breast    Social History   Socioeconomic History  . Marital status: Married    Spouse name: Not on file  . Number of children: 1  . Years of education: GED  . Highest education level: Not on file  Occupational History  . Occupation: Medical laboratory scientific officer: [employer]  Social Needs  . Financial resource strain: Not on file  . Food insecurity:    Worry: Not on file    Inability: Not on file  . Transportation needs:    Medical: Not on file    Non-medical: Not on file  Tobacco Use  . Smoking status: Former Smoker    Packs/day: 1.00    Years: 28.00    Pack years: 28.00    Types: Cigarettes  Last attempt to quit: 06/19/2014    Years since quitting: 4.0  . Smokeless tobacco: Never Used  Substance and Sexual Activity  . Alcohol use: Not Currently    Alcohol/week: 0.0 standard drinks  . Drug use: No  . Sexual activity: Not on file  Lifestyle  . Physical activity:    Days per week: Not on file    Minutes per session: Not on file  . Stress: Not on file  Relationships  . Social connections:    Talks on phone: Not on file    Gets together: Not on file    Attends religious service: Not on file    Active member of club or organization: Not on file    Attends meetings of clubs or organizations: Not on file    Relationship status: Not on file  Other Topics Concern  . Not on file  Social History Narrative   Patient is right handed.   Patient does not drink caffeine.    Review of Systems: A 12 point ROS discussed and pertinent positives are indicated in the HPI above.  All other systems are negative.  Review of Systems  Constitutional: Negative for activity change, fatigue and fever.  HENT: Negative for hearing loss, tinnitus and trouble swallowing.   Eyes: Negative for visual disturbance.  Respiratory: Negative for cough and shortness of breath.   Cardiovascular: Negative for chest pain.  Gastrointestinal: Negative for abdominal pain, nausea and vomiting.  Musculoskeletal: Negative for back pain.  Neurological: Negative for dizziness, tremors, seizures, syncope, facial asymmetry, speech difficulty, weakness, light-headedness, numbness and headaches.       Feels "off balance" at times-- uses cane   Psychiatric/Behavioral: Negative for behavioral problems, confusion and decreased concentration.    Vital Signs: BP 113/82   Pulse 88   Temp 97.9 F (36.6 C) (Oral)   Resp 18   Ht 5' (1.524 m)   Wt 112 lb (50.8 kg)   SpO2 95%   BMI 21.87 kg/m   Physical Exam  Constitutional: She is oriented to person, place, and time. She appears well-nourished.  HENT:  Head: Atraumatic.  Eyes: EOM are normal.  Neck: Neck supple.  Cardiovascular: Normal rate, regular rhythm and normal heart sounds.  Pulmonary/Chest: Effort normal and breath sounds normal. She has no wheezes.  Abdominal: Soft. Bowel sounds are normal. There is no tenderness.  Musculoskeletal: Normal range of motion.  Neurological: She is alert and oriented to person, place, and time. Coordination normal.  Skin: Skin is warm and dry.  Psychiatric: She has a normal mood and affect. Her behavior is normal. Judgment and thought content normal.  Vitals reviewed.   Imaging: No results found.  Labs:  CBC: Recent Labs    02/05/18 1007 07/09/18 0631  WBC 7.0 4.3  HGB 13.4 12.8  HCT 42.2 41.2  PLT 295 182    COAGS: Recent Labs    07/09/18 0631  INR 0.92    BMP: Recent Labs    02/05/18 1007 07/09/18 0631  NA 141 136  K 5.4* 4.3  CL 99 103  CO2 24 22  GLUCOSE 85 93  BUN 15 18  CALCIUM 10.5* 9.7  CREATININE 0.72 0.95  GFRNONAA 94 >60  GFRAA 108 >60    LIVER FUNCTION TESTS: Recent Labs    02/05/18 1007  BILITOT 0.3  AST 22  ALT 14  ALKPHOS 61  PROT 7.5  ALBUMIN 4.7    TUMOR MARKERS: No results for input(s): AFPTM, CEA, CA199, CHROMGRNA in  the last 8760 hours.  Assessment and Plan:  Anterior communicating artery aneurysm Scheduled for cerebral arteriogram with possible embolization Risks and benefits of cerebral angiogram with intervention were discussed with the patient including, but not limited to bleeding, infection, vascular injury, contrast induced renal failure, stroke or even  death.  This interventional procedure involves the use of X-rays and because of the nature of the planned procedure, it is possible that we will have prolonged use of X-ray fluoroscopy.  Potential radiation risks to you include (but are not limited to) the following: - A slightly elevated risk for cancer  several years later in life. This risk is typically less than 0.5% percent. This risk is low in comparison to the normal incidence of human cancer, which is 33% for women and 50% for men according to the [place]. - Radiation induced injury can include skin redness, resembling a rash, tissue breakdown / ulcers and hair loss (which can be temporary or permanent).   The likelihood of either of these occurring depends on the difficulty of the procedure and whether you are sensitive to radiation due to previous procedures, disease, or genetic conditions.   IF your procedure requires a prolonged use of radiation, you will be notified and given written instructions for further action.  It is your responsibility to monitor the irradiated area for the 2 weeks following the procedure and to notify your physician if you are concerned that you have suffered a radiation induced injury.    All of the patient's questions were answered, patient is agreeable to proceed.  Consent signed and in chart. Pt is aware if intervention is performed-- she will be admitted to Neuro ICU overnight. Plan for discharge in am Agreeable to proceed  Thank you for this interesting consult.  I greatly enjoyed meeting Jasslyn L Filyaw and look forward to participating in their care.  A copy of this report was sent to the requesting provider on this date.  Electronically Signed: Lavonia Drafts, PA-C 07/09/2018, 7:51 AM   I spent a total of    40 Minutes in face to face in clinical consultation, greater than 50% of which was counseling/coordinating care for cerebral arteriogram- possible ACOM aneurysm  embolization

## 2018-07-09 NOTE — Progress Notes (Signed)
Patient ID: Brandy Gutierrez, female   DOB: 04/03/1961, 57 y.o.   MRN: [id number] INR. Post procedure. Extubated without difficulty. Denies H/As,N/V visual ,motor or visual symptoms. O/E Alert. Clear and  appropriate responses . Pupils 81mm Rt = LT nofacial asymmetry.Tongue midline. Moves all 4 equally. RT groin soft. No palpable hematoma./ Distal pulses  dopplrable DPs and PTs.   Patient developed RT groin hematoma during the procedure which was compressed out during the procedure.Marland Kitchen S.Indiyah Paone MD

## 2018-07-09 NOTE — Anesthesia Procedure Notes (Signed)
Procedure Name: Intubation Date/Time: 07/09/2018 10:32 AM Performed by: Lowella Dell, CRNA Pre-anesthesia Checklist: Patient identified, Emergency Drugs available, Suction available and Patient being monitored Patient Re-evaluated:Patient Re-evaluated prior to induction Oxygen Delivery Method: Circle System Utilized Preoxygenation: Pre-oxygenation with 100% oxygen Induction Type: IV induction Ventilation: Mask ventilation without difficulty Laryngoscope Size: Mac and 3 Grade View: Grade I Tube type: Oral Number of attempts: 1 Airway Equipment and Method: Stylet Placement Confirmation: ETT inserted through vocal cords under direct vision,  positive ETCO2 and breath sounds checked- equal and bilateral Secured at: 21 cm Tube secured with: Tape Dental Injury: Teeth and Oropharynx as per pre-operative assessment

## 2018-07-10 ENCOUNTER — Inpatient Hospital Stay (HOSPITAL_COMMUNITY): Payer: Medicare Other

## 2018-07-10 ENCOUNTER — Encounter (HOSPITAL_COMMUNITY): Payer: Self-pay | Admitting: Interventional Radiology

## 2018-07-10 DIAGNOSIS — R52 Pain, unspecified: Secondary | ICD-10-CM

## 2018-07-10 LAB — CBC WITH DIFFERENTIAL/PLATELET
ABS IMMATURE GRANULOCYTES: 0.02 10*3/uL (ref 0.00–0.07)
BASOS ABS: 0.1 10*3/uL (ref 0.0–0.1)
Basophils Relative: 1 %
Eosinophils Absolute: 0.3 10*3/uL (ref 0.0–0.5)
Eosinophils Relative: 9 %
HCT: 29.1 % — ABNORMAL LOW (ref 36.0–46.0)
HEMOGLOBIN: 8.9 g/dL — AB (ref 12.0–15.0)
Immature Granulocytes: 1 %
LYMPHS PCT: 32 %
Lymphs Abs: 1.3 10*3/uL (ref 0.7–4.0)
MCH: 31.9 pg (ref 26.0–34.0)
MCHC: 30.6 g/dL (ref 30.0–36.0)
MCV: 104.3 fL — ABNORMAL HIGH (ref 80.0–100.0)
Monocytes Absolute: 0.5 10*3/uL (ref 0.1–1.0)
Monocytes Relative: 12 %
NEUTROS PCT: 45 %
NRBC: 0 % (ref 0.0–0.2)
Neutro Abs: 1.8 10*3/uL (ref 1.7–7.7)
Platelets: 124 10*3/uL — ABNORMAL LOW (ref 150–400)
RBC: 2.79 MIL/uL — AB (ref 3.87–5.11)
RDW: 13.3 % (ref 11.5–15.5)
WBC: 3.9 10*3/uL — AB (ref 4.0–10.5)

## 2018-07-10 MED ORDER — LORAZEPAM 2 MG/ML IJ SOLN
1.0000 mg | Freq: Once | INTRAMUSCULAR | Status: AC
  Administered 2018-07-10: 1 mg via INTRAVENOUS
  Filled 2018-07-10: qty 1

## 2018-07-10 MED ORDER — HYDROCODONE-ACETAMINOPHEN 5-325 MG PO TABS
1.0000 | ORAL_TABLET | Freq: Four times a day (QID) | ORAL | Status: DC | PRN
  Administered 2018-07-10: 1 via ORAL
  Filled 2018-07-10: qty 1

## 2018-07-10 MED ORDER — HYDROCODONE-ACETAMINOPHEN 5-325 MG PO TABS
1.0000 | ORAL_TABLET | Freq: Four times a day (QID) | ORAL | Status: DC | PRN
  Administered 2018-07-11: 1 via ORAL
  Filled 2018-07-10: qty 1

## 2018-07-10 MED ORDER — IOPAMIDOL (ISOVUE-370) INJECTION 76%
100.0000 mL | Freq: Once | INTRAVENOUS | Status: AC | PRN
  Administered 2018-07-10: 100 mL via INTRAVENOUS

## 2018-07-10 MED ORDER — COLESTIPOL HCL 1 G PO TABS
0.5000 g | ORAL_TABLET | Freq: Every day | ORAL | Status: DC
  Administered 2018-07-10: 0.5 g via ORAL
  Filled 2018-07-10 (×2): qty 1

## 2018-07-10 NOTE — Progress Notes (Addendum)
Dr. Estanislado Pandy evaluated patient in room this AM.  Patient complains of right groin incision pain. On physical exam, noted to have LLE weakness- 3/5 dorsiflexion, 4/5 plantarflexion.  Right groin pain- will order [place] to R/O pseudoaneurysm. Noted weakness of LLE- will order CT head and CTA head/neck to evaluate. HgB drop from 12.8 to 8.9- will order CT pelvis to evaluate.  IR to follow.  Bea Graff Liliyana Thobe, PA-C 07/10/2018, 9:19 AM

## 2018-07-10 NOTE — Progress Notes (Signed)
Evaluated patient in room with Dr. Estanislado Pandy.  Informed patient CT head and CTA head/neck negative for acute findings.  Improvement of LLE weakness- now 4/5 dorsiflexion and plantarflexion.  Patient states her right groin pain has improved, but is still there. Still plan for [place] to R/O pseudoaneurysm. Plan for PT eval following [place].  IR to follow.  Bea Graff Zyion Doxtater, PA-C 07/10/2018, 3:02 PM

## 2018-07-10 NOTE — Progress Notes (Signed)
*  PRELIMINARY RESULTS* Vascular Ultrasound Limited Right Lower Extremity Arterial Duplex has been completed.  No evidence of pseudoaneurysm or AV fistula in the right groin.   Garnet Sierras 07/10/2018, 3:11 PM

## 2018-07-10 NOTE — Progress Notes (Signed)
Patient ID: Brandy Gutierrez, female   DOB: February 25, 1961, 57 y.o.   MRN: [id number] INR. Post procedure. Day 1  Back pain improved though persistent .says involves entire back T.and L/S regions.. Otherwise no H/As,N.V visual or new motoe symptoms. RT groin discomfort persists. No chest pain or SOB  Labs Hb 8.9  O/E AAO x 3 speech and comp intact. Pupils 19mm RT = LT  No facial asymmetry. Tongue in the midline. Motor  No dfift og outstretched hands . LT LE PL FL 4/5 DF 3/5 LLE able to lift leg off the bed $/%  RT groin firm over puncture site with extensive  Bruising. Distal pulses dopplerable bilaterally.  Plan. 1.US of the rt groin R/O pseudoaneurysm 2.CT pelvis R/O retroperitoneal hematoma. 3.CTA head and neck  and CT brain ?  New LT LE weakness.  D/W patient and spouse. S.Jamear Carbonneau MD

## 2018-07-11 LAB — CBC
HCT: 28.3 % — ABNORMAL LOW (ref 36.0–46.0)
Hemoglobin: 8.8 g/dL — ABNORMAL LOW (ref 12.0–15.0)
MCH: 32 pg (ref 26.0–34.0)
MCHC: 31.1 g/dL (ref 30.0–36.0)
MCV: 102.9 fL — ABNORMAL HIGH (ref 80.0–100.0)
PLATELETS: 128 10*3/uL — AB (ref 150–400)
RBC: 2.75 MIL/uL — ABNORMAL LOW (ref 3.87–5.11)
RDW: 13.2 % (ref 11.5–15.5)
WBC: 3.4 10*3/uL — ABNORMAL LOW (ref 4.0–10.5)
nRBC: 0 % (ref 0.0–0.2)

## 2018-07-11 LAB — BASIC METABOLIC PANEL
ANION GAP: 5 (ref 5–15)
BUN: 8 mg/dL (ref 6–20)
CHLORIDE: 116 mmol/L — AB (ref 98–111)
CO2: 19 mmol/L — AB (ref 22–32)
Calcium: 7.3 mg/dL — ABNORMAL LOW (ref 8.9–10.3)
Creatinine, Ser: 0.61 mg/dL (ref 0.44–1.00)
GFR calc non Af Amer: >60 mL/min (ref >60)
Glucose, Bld: 70 mg/dL (ref 70–99)
POTASSIUM: 3.6 mmol/L (ref 3.5–5.1)
Sodium: 140 mmol/L (ref 135–145)

## 2018-07-11 LAB — PLATELET INHIBITION P2Y12: PLATELET FUNCTION P2Y12: 133 [PRU] — AB (ref 194–418)

## 2018-07-11 NOTE — Discharge Summary (Signed)
Patient ID: Brandy Gutierrez MRN: [id number] DOB/AGE: [birth year] 57 y.o.  Admit date: 07/09/2018 Discharge date: 07/11/2018  Supervising Physician: Luanne Bras  Patient Status: [facility] - In-pt  Admission Diagnoses: Anterior communicating artery aneurysm  Discharge Diagnoses:  Active Problems:   Brain aneurysm   Discharged Condition: improved  Hospital Course: Anterior communicating artery aneurysm stent assisted coiling performed in IR with Dr Estanislado Pandy 07/09/18. 2 night stay in Neuro ICU. Drop in Hg: Vascular Ultrasound Limited Right Lower Extremity Arterial Duplex has been completed.  No evidence of pseudoaneurysm or AV fistula in the right groin.  Noted weak left foot-- already known foot drop per pts Neurologist; but seemed more pronounced per Dr Estanislado Pandy. Much improved over 2 days of admission. PT evaluation: recommends OP PT and cane use Pt has established relationship with OP Reahb services-- she will call to re set up appointments.-- order placed in chart at DC.  Up in chair this am Eating well; No N/V Has walked with assistance Urinating well; passing gas Denies speech or vision changes Denies numbness or tingling  Pt has been seen and examined by Dr Estanislado Pandy For discharge to home this am        Consults: Physical Therapy  Significant Diagnostic Studies: cerebral arteriogram  Treatments: 1.Approx 15mm x 4 mm ACOM aneurysm  S/P stent assisted coiling  With near complete obliteration.  Discharge Exam: Blood pressure (!) 151/100, pulse 81, temperature 97.6 F (36.4 C), temperature source Oral, resp. rate 18, height 5' (1.524 m), weight 112 lb 3.4 oz (50.9 kg), SpO2 95 %.  Pleasant A/O Face symmetrical Tongue midline Smile=  Heart RRR Lungs CTA Abd soft; +BS; NT Extr: FROM Moving all 4s Grip = and strong Good strength = upper extr Lower extr strength  better on Rt-- left minimally less- much improved Push and pull great on Rt  foot Left foot minimally weaker-- much improved Rt groin NT no bleeding Rt foot 2+ pulses UOP great; Clear yellow  Results for orders placed or performed during the hospital encounter of 07/09/18  MRSA PCR Screening  Result Value Ref Range   MRSA by PCR NEGATIVE NEGATIVE  Basic metabolic panel  Result Value Ref Range   Sodium 136 135 - 145 mmol/L   Potassium 4.3 3.5 - 5.1 mmol/L   Chloride 103 98 - 111 mmol/L   CO2 22 22 - 32 mmol/L   Glucose, Bld 93 70 - 99 mg/dL   BUN 18 6 - 20 mg/dL   Creatinine, Ser 0.95 0.44 - 1.00 mg/dL   Calcium 9.7 8.9 - 10.3 mg/dL   GFR calc non Af Amer >60 >60 mL/min   GFR calc Af Amer >60 >60 mL/min   Anion gap 11 5 - 15  CBC WITH DIFFERENTIAL  Result Value Ref Range   WBC 4.3 4.0 - 10.5 K/uL   RBC 4.10 3.87 - 5.11 MIL/uL   Hemoglobin 12.8 12.0 - 15.0 g/dL   HCT 41.2 36.0 - 46.0 %   MCV 100.5 (H) 80.0 - 100.0 fL   MCH 31.2 26.0 - 34.0 pg   MCHC 31.1 30.0 - 36.0 g/dL   RDW 12.9 11.5 - 15.5 %   Platelets 182 150 - 400 K/uL   nRBC 0.0 0.0 - 0.2 %   Neutrophils Relative % 42 %   Neutro Abs 1.9 1.7 - 7.7 K/uL   Lymphocytes Relative 32 %   Lymphs Abs 1.4 0.7 - 4.0 K/uL   Monocytes Relative 14 %   Monocytes  Absolute 0.6 0.1 - 1.0 K/uL   Eosinophils Relative 9 %   Eosinophils Absolute 0.4 0.0 - 0.5 K/uL   Basophils Relative 2 %   Basophils Absolute 0.1 0.0 - 0.1 K/uL   Immature Granulocytes 1 %   Abs Immature Granulocytes 0.02 0.00 - 0.07 K/uL  Platelet inhibition p2y12 (not at [facility])  Result Value Ref Range   Platelet Function  P2Y12 63 (L) 194 - 418 PRU  Protime-INR  Result Value Ref Range   Prothrombin Time 12.3 11.4 - 15.2 seconds   INR 0.92   Heparin level (unfractionated)  Result Value Ref Range   Heparin Unfractionated 0.19 (L) 0.30 - 0.70 IU/mL  Basic metabolic panel  Result Value Ref Range   Sodium 140 135 - 145 mmol/L   Potassium 3.6 3.5 - 5.1 mmol/L   Chloride 116 (H) 98 - 111 mmol/L   CO2 19 (L) 22 - 32 mmol/L   Glucose,  Bld 70 70 - 99 mg/dL   BUN 8 6 - 20 mg/dL   Creatinine, Ser 0.61 0.44 - 1.00 mg/dL   Calcium 7.3 (L) 8.9 - 10.3 mg/dL   GFR calc non Af Amer >60 >60 mL/min   GFR calc Af Amer >60 >60 mL/min   Anion gap 5 5 - 15  CBC with Differential/Platelet  Result Value Ref Range   WBC 3.9 (L) 4.0 - 10.5 K/uL   RBC 2.79 (L) 3.87 - 5.11 MIL/uL   Hemoglobin 8.9 (L) 12.0 - 15.0 g/dL   HCT 29.1 (L) 36.0 - 46.0 %   MCV 104.3 (H) 80.0 - 100.0 fL   MCH 31.9 26.0 - 34.0 pg   MCHC 30.6 30.0 - 36.0 g/dL   RDW 13.3 11.5 - 15.5 %   Platelets 124 (L) 150 - 400 K/uL   nRBC 0.0 0.0 - 0.2 %   Neutrophils Relative % 45 %   Neutro Abs 1.8 1.7 - 7.7 K/uL   Lymphocytes Relative 32 %   Lymphs Abs 1.3 0.7 - 4.0 K/uL   Monocytes Relative 12 %   Monocytes Absolute 0.5 0.1 - 1.0 K/uL   Eosinophils Relative 9 %   Eosinophils Absolute 0.3 0.0 - 0.5 K/uL   Basophils Relative 1 %   Basophils Absolute 0.1 0.0 - 0.1 K/uL   Immature Granulocytes 1 %   Abs Immature Granulocytes 0.02 0.00 - 0.07 K/uL  Platelet inhibition p2y12 (Not at [facility])  Result Value Ref Range   Platelet Function  P2Y12 133 (L) 194 - 418 PRU  CBC  Result Value Ref Range   WBC 3.4 (L) 4.0 - 10.5 K/uL   RBC 2.75 (L) 3.87 - 5.11 MIL/uL   Hemoglobin 8.8 (L) 12.0 - 15.0 g/dL   HCT 28.3 (L) 36.0 - 46.0 %   MCV 102.9 (H) 80.0 - 100.0 fL   MCH 32.0 26.0 - 34.0 pg   MCHC 31.1 30.0 - 36.0 g/dL   RDW 13.2 11.5 - 15.5 %   Platelets 128 (L) 150 - 400 K/uL   nRBC 0.0 0.0 - 0.2 %     Disposition:  ACOM aneurysm embolization in IR 11/21 2 night stay in Neuro ICU Left foot weakness much improved Plan for DC today Dr Estanislado Pandy has seen and evaluated pt Continue all home meds Continue ASA 81 mg and Plavix 75 mg daily 2 week follow up appt Scheduler will call pt with time and date She has good understnanding of DC plans PT has recommended to walk with cane--  she has at home Also recommend: OP PT-- pt has established relationship with OP PT program She  will call for re set up appt times (order in chart)    Discharge Instructions    Call MD for:  difficulty breathing, headache or visual disturbances   Complete by:  As directed    Call MD for:  extreme fatigue   Complete by:  As directed    Call MD for:  hives   Complete by:  As directed    Call MD for:  persistant dizziness or light-headedness   Complete by:  As directed    Call MD for:  persistant nausea and vomiting   Complete by:  As directed    Call MD for:  redness, tenderness, or signs of infection (pain, swelling, redness, odor or green/yellow discharge around incision site)   Complete by:  As directed    Call MD for:  severe uncontrolled pain   Complete by:  As directed    Call MD for:  temperature >100.4   Complete by:  As directed    Diet - low sodium heart healthy   Complete by:  As directed    Discharge instructions   Complete by:  As directed    Resume all home meds-- continue ASA 81 mg and Plavix 75 mg daily; pt will hear from IR scheduler for OP follow up- 2 weeks with Dr Estanislado Pandy; call [phone] if any needs   Discharge wound care:   Complete by:  As directed    May shower today-- keep new bandaid on Rt groin site daily x 1 week   Driving Restrictions   Complete by:  As directed    No driving x 2 weeks   Increase activity slowly   Complete by:  As directed    Lifting restrictions   Complete by:  As directed    No lifting over 10 lbs x 2 weeks     Allergies as of 07/11/2018      Reactions   Sulfa Antibiotics Hives, Shortness Of Breath   Metronidazole Other (See Comments)   Unknown reaction type   Cymbalta [duloxetine Hcl] Other (See Comments)   Confusion      Medication List    TAKE these medications   ALLEGRA ALLERGY 180 MG tablet Generic drug:  fexofenadine Take 180 mg by mouth daily.   amitriptyline 10 MG tablet Commonly known as:  ELAVIL Take 10 mg by mouth at bedtime.   amLODipine 2.5 MG tablet Commonly known as:  NORVASC Take 2.5  mg by mouth daily.   aspirin EC 81 MG tablet Take 81 mg by mouth daily.   Biotin 10000 MCG Tabs Take 10,000 mcg by mouth 2 (two) times daily.   clopidogrel 75 MG tablet Commonly known as:  PLAVIX Take 75 mg by mouth daily.   colestipol 1 g tablet Commonly known as:  COLESTID Take 0.5 g by mouth daily.   cyanocobalamin 1000 MCG/ML injection Commonly known as:  (VITAMIN B-12) Inject 1,000 mcg into the muscle every 30 (thirty) days.   ferrous sulfate 325 (65 FE) MG tablet Take 325 mg by mouth 2 (two) times daily.   folic acid 1 MG tablet Commonly known as:  FOLVITE Take 1 mg by mouth daily.   gabapentin 600 MG tablet Commonly known as:  NEURONTIN Take 600 mg by mouth 4 (four) times daily.   ibuprofen 800 MG tablet Commonly known as:  ADVIL,MOTRIN Take 800 mg by mouth 3 (three) times  daily as needed for pain.   ipratropium-albuterol 0.5-2.5 (3) MG/3ML Soln Commonly known as:  DUONEB Take 3 mLs by nebulization every 6 (six) hours as needed (for wheezing/shortness of breath.).   lamoTRIgine 25 MG tablet Commonly known as:  LAMICTAL Take 50 mg by mouth 2 (two) times daily.   mirtazapine 15 MG tablet Commonly known as:  REMERON Take 15 mg by mouth at bedtime.   multivitamin with minerals Tabs tablet Take 1 tablet by mouth daily at 8 pm.   PROAIR HFA 108 (90 Base) MCG/ACT inhaler Generic drug:  albuterol Inhale 2 puffs into the lungs every 6 (six) hours as needed for wheezing or shortness of breath.   propranolol 20 MG tablet Commonly known as:  INDERAL Take 20 mg by mouth 2 (two) times daily.   rosuvastatin 20 MG tablet Commonly known as:  CRESTOR Take 20 mg by mouth at bedtime.   thiamine 100 MG tablet Commonly known as:  VITAMIN B-1 Take 100 mg by mouth daily.   Vitamin D-3 25 MCG (1000 UT) Caps Take 1,000 Units by mouth daily.            Discharge Care Instructions  (From admission, onward)         Start     Ordered   07/11/18 0000  Discharge  wound care:    Comments:  May shower today-- keep new bandaid on Rt groin site daily x 1 week   07/11/18 1004         Follow-up Information    Luanne Bras, MD Follow up in 2 week(s).   Specialties:  Interventional Radiology, Radiology Why:  follow up with Dr Estanislado Pandy; scheduler will call pt with time and date-- call [phone] if any needs Contact information: [place] [address] [phone]            Electronically Signed: Monia Sabal A, PA-C 07/11/2018, 10:07 AM   I have spent Greater Than 30 Minutes discharging [place].

## 2018-07-11 NOTE — Plan of Care (Signed)

## 2018-07-11 NOTE — Evaluation (Signed)
Physical Therapy Evaluation Patient Details Name: Brandy Gutierrez MRN: [id number] DOB: 02-24-1961 Today's Date: 07/11/2018   History of Present Illness  57 yo admitted for ACA aneurysm embolization. PMhx: fibromyalgia, Sz, cervical CA, insomnia, COPD, HTN, memory disorder  Clinical Impression  Pt very pleasant and eager to get OOB. Pt performing bed mobility and transfers without assist. Pt with gait deviation with wide BOS and slightly ataxic gait who will benefit from OPPT to maximize normalization of gait and strength. Pt educated to use cane currently particularly on uneven surfaces and continue her home HEP she obtained previously. Pt with impaired gait and strength who will benefit from acute therapy to maximize independence.     Follow Up Recommendations Outpatient PT    Equipment Recommendations  None recommended by PT    Recommendations for Other Services       Precautions / Restrictions Precautions Precautions: Fall      Mobility  Bed Mobility Overal bed mobility: Modified Independent                Transfers Overall transfer level: Modified independent                  Ambulation/Gait Ambulation/Gait assistance: Supervision Gait Distance (Feet): 300 Feet Assistive device: None Gait Pattern/deviations: Step-through pattern;Wide base of support   Gait velocity interpretation: >2.62 ft/sec, indicative of community ambulatory General Gait Details: pt with slightly ataxic stepping of LLE but able to control and stabilize with wide BOS including head turns  Financial trader [place] (Stroke Patients Only)       Balance Overall balance assessment: Mild deficits observed, not formally tested                                           Pertinent Vitals/Pain Pain Assessment: 0-10 Pain Score: 2  Pain Location: head Pain Descriptors / Indicators: Aching Pain Intervention(s): Limited  activity within patient's tolerance;Repositioned;Monitored during session    Home Living Family/patient expects to be discharged to:: Private residence Living Arrangements: Spouse/significant other Available Help at Discharge: Family;Available 24 hours/day Type of Home: House Home Access: Level entry     Home Layout: One level Home Equipment: Cane - single point;Walker - 2 wheels;Bedside commode;Shower seat;Grab bars - tub/shower      Prior Function Level of Independence: Independent         Comments: uses cane on occasion     Hand Dominance        Extremity/Trunk Assessment   Upper Extremity Assessment Upper Extremity Assessment: LUE deficits/detail LUE Deficits / Details: 4/5 bicep and 3/5 grip    Lower Extremity Assessment Lower Extremity Assessment: LLE deficits/detail LLE Deficits / Details: 4/5 hip flexion, 4/5 knee flexion/extension and dorsiflexion LLE Sensation: history of peripheral neuropathy    Cervical / Trunk Assessment Cervical / Trunk Assessment: Normal  Communication   Communication: No difficulties  Cognition Arousal/Alertness: Awake/alert Behavior During Therapy: WFL for tasks assessed/performed Overall Cognitive Status: Within Functional Limits for tasks assessed                                        General Comments      Exercises     Assessment/Plan  PT Assessment Patient needs continued PT services  PT Problem List Decreased strength;Decreased mobility;Decreased activity tolerance;Decreased balance;Impaired sensation       PT Treatment Interventions Gait training;Therapeutic activities;Neuromuscular re-education;Therapeutic exercise;Functional mobility training;Patient/family education    PT Goals (Current goals can be found in the Care Plan section)  Acute Rehab PT Goals Patient Stated Goal[place] Goal Formulation: With patient/family Time For Goal Achievement: 07/18/18 Potential to  Achieve Goals: Good    Frequency Min 3X/week   Barriers to discharge        Co-evaluation               AM-PAC PT "6 Clicks" Mobility  Outcome Measure Help needed turning from your back to your side while in a flat bed without using bedrails?: None Help needed moving from lying on your back to sitting on the side of a flat bed without using bedrails?: None Help needed moving to and from a bed to a chair (including a wheelchair)?: None Help needed standing up from a chair using your arms (e.g., wheelchair or bedside chair)?: None Help needed to walk in hospital room?: A Little Help needed climbing 3-5 steps with a railing? : A Little 6 Click Score: 22    End of Session Equipment Utilized During Treatment: Gait belt Activity Tolerance: Patient tolerated treatment well Patient left: in chair;with call bell/phone within reach;with family/visitor present Nurse Communication: Mobility status PT Visit Diagnosis: Other abnormalities of gait and mobility (R26.89);Muscle weakness (generalized) (M62.81)    Time: 6283-1517 PT Time Calculation (min) (ACUTE ONLY): 16 min   Charges:   PT Evaluation $PT Eval Moderate Complexity: 1 Mod          [place], PT Acute Rehabilitation Services Pager: [phone] Office: [phone]   Aubra Pappalardo B Averill Winters 07/11/2018, 9:28 AM

## 2018-07-11 NOTE — Care Management Note (Signed)
Case Management Note  Patient Details  Name: Brandy Gutierrez MRN: [id number] Date of Birth: 08/06/1961  Subjective/Objective:        Pt from home with husband.  Pt has multiple DME including cane, walker, and 3n1.  Pt has used [facility] in the past and wants to continue to use for HHPT.  Pt states transportation is an issue and would not be able to get to OPPT as needed.    Pt just received Medicare card.            Action/Plan: Referral called to [facility]).  [facility] will contact patient to set up PT.   Copy of [place] card sent to [facility].   Expected Discharge Date:  07/11/18               Expected Discharge Plan:  [place]  In-House Referral:  NA  Discharge planning Services  CM Consult  Post Acute Care Choice:  Home Health Choice offered to:  Patient  DME Arranged:    DME Agency:     HH Arranged:  PT [place]:  [place]  Status of Service:  Completed, signed off  If discussed at [place] of Stay Meetings, dates discussed:    Additional Comments:  Claudie Leach, RN 07/11/2018, 10:55 AM

## 2018-07-13 ENCOUNTER — Telehealth: Payer: Self-pay | Admitting: Student

## 2018-07-13 ENCOUNTER — Encounter (HOSPITAL_COMMUNITY): Payer: Self-pay | Admitting: Interventional Radiology

## 2018-07-13 NOTE — Progress Notes (Signed)
Phoned in prescription for patient to [facility] [phone])- Plavix 75 mg tablets, take one tablet by mouth once daily, dispense 30 tablets with 3 refills.  Bea Graff Lititia Sen, PA-C 07/13/2018, 2:42 PM

## 2018-07-21 DIAGNOSIS — M21372 Foot drop, left foot: Secondary | ICD-10-CM | POA: Diagnosis not present

## 2018-07-21 DIAGNOSIS — I671 Cerebral aneurysm, nonruptured: Secondary | ICD-10-CM | POA: Diagnosis not present

## 2018-07-21 DIAGNOSIS — Z48812 Encounter for surgical aftercare following surgery on the circulatory system: Secondary | ICD-10-CM | POA: Diagnosis not present

## 2018-07-21 DIAGNOSIS — Z7902 Long term (current) use of antithrombotics/antiplatelets: Secondary | ICD-10-CM | POA: Diagnosis not present

## 2018-07-21 DIAGNOSIS — Z79899 Other long term (current) drug therapy: Secondary | ICD-10-CM | POA: Diagnosis not present

## 2018-07-21 DIAGNOSIS — Z7982 Long term (current) use of aspirin: Secondary | ICD-10-CM | POA: Diagnosis not present

## 2018-07-21 DIAGNOSIS — I10 Essential (primary) hypertension: Secondary | ICD-10-CM | POA: Diagnosis not present

## 2018-07-21 DIAGNOSIS — Z87891 Personal history of nicotine dependence: Secondary | ICD-10-CM | POA: Diagnosis not present

## 2018-07-21 DIAGNOSIS — R2689 Other abnormalities of gait and mobility: Secondary | ICD-10-CM | POA: Diagnosis not present

## 2018-07-21 DIAGNOSIS — J449 Chronic obstructive pulmonary disease, unspecified: Secondary | ICD-10-CM | POA: Diagnosis not present

## 2018-07-21 DIAGNOSIS — Z95828 Presence of other vascular implants and grafts: Secondary | ICD-10-CM | POA: Diagnosis not present

## 2018-07-21 DIAGNOSIS — M797 Fibromyalgia: Secondary | ICD-10-CM | POA: Diagnosis not present

## 2018-07-21 DIAGNOSIS — Z8673 Personal history of transient ischemic attack (TIA), and cerebral infarction without residual deficits: Secondary | ICD-10-CM | POA: Diagnosis not present

## 2018-07-22 DIAGNOSIS — J449 Chronic obstructive pulmonary disease, unspecified: Secondary | ICD-10-CM | POA: Diagnosis not present

## 2018-07-22 DIAGNOSIS — Z48812 Encounter for surgical aftercare following surgery on the circulatory system: Secondary | ICD-10-CM | POA: Diagnosis not present

## 2018-07-22 DIAGNOSIS — R2689 Other abnormalities of gait and mobility: Secondary | ICD-10-CM | POA: Diagnosis not present

## 2018-07-22 DIAGNOSIS — M797 Fibromyalgia: Secondary | ICD-10-CM | POA: Diagnosis not present

## 2018-07-22 DIAGNOSIS — I10 Essential (primary) hypertension: Secondary | ICD-10-CM | POA: Diagnosis not present

## 2018-07-22 DIAGNOSIS — Z7902 Long term (current) use of antithrombotics/antiplatelets: Secondary | ICD-10-CM | POA: Diagnosis not present

## 2018-07-22 DIAGNOSIS — M21372 Foot drop, left foot: Secondary | ICD-10-CM | POA: Diagnosis not present

## 2018-07-22 DIAGNOSIS — Z95828 Presence of other vascular implants and grafts: Secondary | ICD-10-CM | POA: Diagnosis not present

## 2018-07-22 DIAGNOSIS — Z7982 Long term (current) use of aspirin: Secondary | ICD-10-CM | POA: Diagnosis not present

## 2018-07-22 DIAGNOSIS — I671 Cerebral aneurysm, nonruptured: Secondary | ICD-10-CM | POA: Diagnosis not present

## 2018-07-22 DIAGNOSIS — Z8673 Personal history of transient ischemic attack (TIA), and cerebral infarction without residual deficits: Secondary | ICD-10-CM | POA: Diagnosis not present

## 2018-07-22 DIAGNOSIS — Z79899 Other long term (current) drug therapy: Secondary | ICD-10-CM | POA: Diagnosis not present

## 2018-07-22 DIAGNOSIS — Z87891 Personal history of nicotine dependence: Secondary | ICD-10-CM | POA: Diagnosis not present

## 2018-07-23 ENCOUNTER — Ambulatory Visit (HOSPITAL_COMMUNITY)
Admission: RE | Admit: 2018-07-23 | Discharge: 2018-07-23 | Disposition: A | Discharge status: Home / Self Care | Payer: Medicare Other | Source: Ambulatory Visit | Attending: Radiology | Admitting: Radiology

## 2018-07-23 DIAGNOSIS — Z79899 Other long term (current) drug therapy: Secondary | ICD-10-CM | POA: Diagnosis not present

## 2018-07-23 DIAGNOSIS — Z7902 Long term (current) use of antithrombotics/antiplatelets: Secondary | ICD-10-CM | POA: Insufficient documentation

## 2018-07-23 DIAGNOSIS — Z7982 Long term (current) use of aspirin: Secondary | ICD-10-CM | POA: Insufficient documentation

## 2018-07-23 DIAGNOSIS — I671 Cerebral aneurysm, nonruptured: Secondary | ICD-10-CM | POA: Insufficient documentation

## 2018-07-23 LAB — PLATELET INHIBITION P2Y12: Platelet Function  P2Y12: 44 [PRU] — ABNORMAL LOW (ref 194–418)

## 2018-07-23 NOTE — Progress Notes (Signed)
Referring Physician(s): Dr Neldon [place]  Supervising Physician: Luanne Bras  Patient Status:  [facility] outpatient  Chief Complaint:  Follow up after aneurysm coiling  HPI:  Ms Faivre is here today for her 2 week follow up after anterior communicating artery aneurysm stent assisted coiling performed in IR with Dr Estanislado Pandy 07/09/18.  She had a 2 night stay in Neuro ICU.  She had a drop in her Hgb post procedure.  Vascular US showed no evidence of pseudoaneurysm or AV fistula in the right groin.  She did have some bruising that lasted a little over a week but she states it is almost gone now.  She has known foot drop per her Neurologist and this continues to improve with physical therapy.  She has PT 2 x per week.   She is compliant with 81 mg ASA and 75 mg Plavix daily.  Allergies: Sulfa antibiotics; Metronidazole; and Cymbalta [duloxetine hcl]  Medications: Prior to Admission medications   Medication Sig Start Date End Date Taking? Authorizing Provider  albuterol (PROAIR HFA) 108 (90 Base) MCG/ACT inhaler Inhale 2 puffs into the lungs every 6 (six) hours as needed for wheezing or shortness of breath.    [provider]  amitriptyline (ELAVIL) 10 MG tablet Take 10 mg by mouth at bedtime. 05/19/18   [provider]  amLODipine (NORVASC) 2.5 MG tablet Take 2.5 mg by mouth daily.  04/28/18   [provider]  aspirin EC 81 MG tablet Take 81 mg by mouth daily.    [provider]  Biotin 10000 MCG TABS Take 10,000 mcg by mouth 2 (two) times daily.    [provider]  Cholecalciferol (VITAMIN D-3) 1000 units CAPS Take 1,000 Units by mouth daily.     [provider]  clopidogrel (PLAVIX) 75 MG tablet Take 75 mg by mouth daily. 06/04/18   [provider]  colestipol (COLESTID) 1 g tablet Take 0.5 g by mouth daily.  05/06/18   [provider]  cyanocobalamin (,VITAMIN B-12,) 1000 MCG/ML injection Inject  1,000 mcg into the muscle every 30 (thirty) days.    [provider]  ferrous sulfate 325 (65 FE) MG tablet Take 325 mg by mouth 2 (two) times daily.     [provider]  fexofenadine (ALLEGRA ALLERGY) 180 MG tablet Take 180 mg by mouth daily.    [provider]  folic acid (FOLVITE) 1 MG tablet Take 1 mg by mouth daily. 03/31/14   [provider]  gabapentin (NEURONTIN) 600 MG tablet Take 600 mg by mouth 4 (four) times daily.    [provider]  ibuprofen (ADVIL,MOTRIN) 800 MG tablet Take 800 mg by mouth 3 (three) times daily as needed for pain. 05/06/18   [provider]  ipratropium-albuterol (DUONEB) 0.5-2.5 (3) MG/3ML SOLN Take 3 mLs by nebulization every 6 (six) hours as needed (for wheezing/shortness of breath.).    [provider]  lamoTRIgine (LAMICTAL) 25 MG tablet Take 50 mg by mouth 2 (two) times daily.     [provider]  mirtazapine (REMERON) 15 MG tablet Take 15 mg by mouth at bedtime.    [provider]  Multiple Vitamin (MULTIVITAMIN WITH MINERALS) TABS tablet Take 1 tablet by mouth daily at 8 pm.    [provider]  propranolol (INDERAL) 20 MG tablet Take 20 mg by mouth 2 (two) times daily. 06/03/18   [provider]  rosuvastatin (CRESTOR) 20 MG tablet Take 20 mg by mouth  at bedtime. 05/01/18   [provider]  thiamine (VITAMIN B-1) 100 MG tablet Take 100 mg by mouth daily.    [provider]    Physical Exam  Constitutional: She is oriented to person, place, and time.  Thin. Reports she lost 2 pounds after procedure.  HENT:  Head: Normocephalic and atraumatic.  Eyes: EOM are normal.  Neck: Normal range of motion.  Cardiovascular: Normal rate, regular rhythm and normal heart sounds.  Pulmonary/Chest: Effort normal and breath sounds normal. No respiratory distress.  Musculoskeletal:  Known foot drop left foot. Walks with a cane. No signs of bruising at the  right groin/thigh.  Neurological: She is alert and oriented to person, place, and time. No cranial nerve deficit.  Skin: Skin is warm and dry.  Psychiatric: She has a normal mood and affect. Her behavior is normal. Judgment and thought content normal.    Imaging: No results found.  Labs:  CBC: Recent Labs    02/05/18 1007 07/09/18 0631 07/10/18 0443 07/11/18 0229  WBC 7.0 4.3 3.9* 3.4*  HGB 13.4 12.8 8.9* 8.8*  HCT 42.2 41.2 29.1* 28.3*  PLT 295 182 124* 128*    COAGS: Recent Labs    07/09/18 0631  INR 0.92    BMP: Recent Labs    02/05/18 1007 07/09/18 0631 07/10/18 0443  NA 141 136 140  K 5.4* 4.3 3.6  CL 99 103 116*  CO2 24 22 19*  GLUCOSE 85 93 70  BUN 15 18 8   CALCIUM 10.5* 9.7 7.3*  CREATININE 0.72 0.95 0.61  GFRNONAA 94 >60 >60  GFRAA 108 >60 >60    LIVER FUNCTION TESTS: Recent Labs    02/05/18 1007  BILITOT 0.3  AST 22  ALT 14  ALKPHOS 61  PROT 7.5  ALBUMIN 4.7    Assessment and Plan:  S/P anterior communicating artery aneurysm stent assisted coiling performed in IR with Dr Estanislado Pandy 07/09/18.  Continue 81 mg aspirin and 75 mg Plavix daily for at least 6 weeks then consider aspirin only.  Will draw a P2Y12 today.  She sees her PCP on December 11 and Dr. Estanislado Pandy recommends she have her hemoglobin checked there.  Return in 4-6 months with CT angiogram or with MRA.  Electronically Signed: Murrell Redden, PA-C 07/23/2018, 1:34 PM    I spent a total of 25 Minutes at the the patient's bedside AND on the patient's hospital floor or unit, greater than 50% of which was counseling/coordinating care for f/u after aneurysm coiling.

## 2018-07-28 DIAGNOSIS — I10 Essential (primary) hypertension: Secondary | ICD-10-CM | POA: Diagnosis not present

## 2018-07-28 DIAGNOSIS — G9009 Other idiopathic peripheral autonomic neuropathy: Secondary | ICD-10-CM | POA: Diagnosis not present

## 2018-07-29 DIAGNOSIS — Z95828 Presence of other vascular implants and grafts: Secondary | ICD-10-CM | POA: Diagnosis not present

## 2018-07-29 DIAGNOSIS — I10 Essential (primary) hypertension: Secondary | ICD-10-CM | POA: Diagnosis not present

## 2018-07-29 DIAGNOSIS — Z7902 Long term (current) use of antithrombotics/antiplatelets: Secondary | ICD-10-CM | POA: Diagnosis not present

## 2018-07-29 DIAGNOSIS — J449 Chronic obstructive pulmonary disease, unspecified: Secondary | ICD-10-CM | POA: Diagnosis not present

## 2018-07-29 DIAGNOSIS — R2689 Other abnormalities of gait and mobility: Secondary | ICD-10-CM | POA: Diagnosis not present

## 2018-07-29 DIAGNOSIS — Z7982 Long term (current) use of aspirin: Secondary | ICD-10-CM | POA: Diagnosis not present

## 2018-07-29 DIAGNOSIS — I671 Cerebral aneurysm, nonruptured: Secondary | ICD-10-CM | POA: Diagnosis not present

## 2018-07-29 DIAGNOSIS — Z8673 Personal history of transient ischemic attack (TIA), and cerebral infarction without residual deficits: Secondary | ICD-10-CM | POA: Diagnosis not present

## 2018-07-29 DIAGNOSIS — M21372 Foot drop, left foot: Secondary | ICD-10-CM | POA: Diagnosis not present

## 2018-07-29 DIAGNOSIS — Z79899 Other long term (current) drug therapy: Secondary | ICD-10-CM | POA: Diagnosis not present

## 2018-07-29 DIAGNOSIS — Z87891 Personal history of nicotine dependence: Secondary | ICD-10-CM | POA: Diagnosis not present

## 2018-07-29 DIAGNOSIS — M797 Fibromyalgia: Secondary | ICD-10-CM | POA: Diagnosis not present

## 2018-07-29 DIAGNOSIS — Z48812 Encounter for surgical aftercare following surgery on the circulatory system: Secondary | ICD-10-CM | POA: Diagnosis not present

## 2018-08-03 DIAGNOSIS — Z95828 Presence of other vascular implants and grafts: Secondary | ICD-10-CM | POA: Diagnosis not present

## 2018-08-03 DIAGNOSIS — Z79899 Other long term (current) drug therapy: Secondary | ICD-10-CM | POA: Diagnosis not present

## 2018-08-03 DIAGNOSIS — M21372 Foot drop, left foot: Secondary | ICD-10-CM | POA: Diagnosis not present

## 2018-08-03 DIAGNOSIS — Z7982 Long term (current) use of aspirin: Secondary | ICD-10-CM | POA: Diagnosis not present

## 2018-08-03 DIAGNOSIS — Z8673 Personal history of transient ischemic attack (TIA), and cerebral infarction without residual deficits: Secondary | ICD-10-CM | POA: Diagnosis not present

## 2018-08-03 DIAGNOSIS — R2689 Other abnormalities of gait and mobility: Secondary | ICD-10-CM | POA: Diagnosis not present

## 2018-08-03 DIAGNOSIS — M797 Fibromyalgia: Secondary | ICD-10-CM | POA: Diagnosis not present

## 2018-08-03 DIAGNOSIS — Z7902 Long term (current) use of antithrombotics/antiplatelets: Secondary | ICD-10-CM | POA: Diagnosis not present

## 2018-08-03 DIAGNOSIS — J449 Chronic obstructive pulmonary disease, unspecified: Secondary | ICD-10-CM | POA: Diagnosis not present

## 2018-08-03 DIAGNOSIS — I671 Cerebral aneurysm, nonruptured: Secondary | ICD-10-CM | POA: Diagnosis not present

## 2018-08-03 DIAGNOSIS — I10 Essential (primary) hypertension: Secondary | ICD-10-CM | POA: Diagnosis not present

## 2018-08-03 DIAGNOSIS — Z87891 Personal history of nicotine dependence: Secondary | ICD-10-CM | POA: Diagnosis not present

## 2018-08-03 DIAGNOSIS — Z48812 Encounter for surgical aftercare following surgery on the circulatory system: Secondary | ICD-10-CM | POA: Diagnosis not present

## 2018-08-05 DIAGNOSIS — I10 Essential (primary) hypertension: Secondary | ICD-10-CM | POA: Diagnosis not present

## 2018-08-05 DIAGNOSIS — Z79899 Other long term (current) drug therapy: Secondary | ICD-10-CM | POA: Diagnosis not present

## 2018-08-05 DIAGNOSIS — Z7982 Long term (current) use of aspirin: Secondary | ICD-10-CM | POA: Diagnosis not present

## 2018-08-05 DIAGNOSIS — Z8673 Personal history of transient ischemic attack (TIA), and cerebral infarction without residual deficits: Secondary | ICD-10-CM | POA: Diagnosis not present

## 2018-08-05 DIAGNOSIS — Z87891 Personal history of nicotine dependence: Secondary | ICD-10-CM | POA: Diagnosis not present

## 2018-08-05 DIAGNOSIS — M797 Fibromyalgia: Secondary | ICD-10-CM | POA: Diagnosis not present

## 2018-08-05 DIAGNOSIS — I671 Cerebral aneurysm, nonruptured: Secondary | ICD-10-CM | POA: Diagnosis not present

## 2018-08-05 DIAGNOSIS — Z95828 Presence of other vascular implants and grafts: Secondary | ICD-10-CM | POA: Diagnosis not present

## 2018-08-05 DIAGNOSIS — M21372 Foot drop, left foot: Secondary | ICD-10-CM | POA: Diagnosis not present

## 2018-08-05 DIAGNOSIS — Z48812 Encounter for surgical aftercare following surgery on the circulatory system: Secondary | ICD-10-CM | POA: Diagnosis not present

## 2018-08-05 DIAGNOSIS — J449 Chronic obstructive pulmonary disease, unspecified: Secondary | ICD-10-CM | POA: Diagnosis not present

## 2018-08-05 DIAGNOSIS — Z7902 Long term (current) use of antithrombotics/antiplatelets: Secondary | ICD-10-CM | POA: Diagnosis not present

## 2018-08-05 DIAGNOSIS — R2689 Other abnormalities of gait and mobility: Secondary | ICD-10-CM | POA: Diagnosis not present

## 2018-08-13 DIAGNOSIS — Z79899 Other long term (current) drug therapy: Secondary | ICD-10-CM | POA: Diagnosis not present

## 2018-08-13 DIAGNOSIS — Z8673 Personal history of transient ischemic attack (TIA), and cerebral infarction without residual deficits: Secondary | ICD-10-CM | POA: Diagnosis not present

## 2018-08-13 DIAGNOSIS — R2689 Other abnormalities of gait and mobility: Secondary | ICD-10-CM | POA: Diagnosis not present

## 2018-08-13 DIAGNOSIS — I10 Essential (primary) hypertension: Secondary | ICD-10-CM | POA: Diagnosis not present

## 2018-08-13 DIAGNOSIS — Z7902 Long term (current) use of antithrombotics/antiplatelets: Secondary | ICD-10-CM | POA: Diagnosis not present

## 2018-08-13 DIAGNOSIS — Z7982 Long term (current) use of aspirin: Secondary | ICD-10-CM | POA: Diagnosis not present

## 2018-08-13 DIAGNOSIS — Z95828 Presence of other vascular implants and grafts: Secondary | ICD-10-CM | POA: Diagnosis not present

## 2018-08-13 DIAGNOSIS — Z48812 Encounter for surgical aftercare following surgery on the circulatory system: Secondary | ICD-10-CM | POA: Diagnosis not present

## 2018-08-13 DIAGNOSIS — J449 Chronic obstructive pulmonary disease, unspecified: Secondary | ICD-10-CM | POA: Diagnosis not present

## 2018-08-13 DIAGNOSIS — M21372 Foot drop, left foot: Secondary | ICD-10-CM | POA: Diagnosis not present

## 2018-08-13 DIAGNOSIS — M797 Fibromyalgia: Secondary | ICD-10-CM | POA: Diagnosis not present

## 2018-08-13 DIAGNOSIS — Z87891 Personal history of nicotine dependence: Secondary | ICD-10-CM | POA: Diagnosis not present

## 2018-08-13 DIAGNOSIS — I671 Cerebral aneurysm, nonruptured: Secondary | ICD-10-CM | POA: Diagnosis not present

## 2018-09-03 ENCOUNTER — Encounter: Payer: Self-pay | Admitting: Adult Health

## 2018-09-03 ENCOUNTER — Ambulatory Visit: Payer: Medicare Other | Admitting: Adult Health

## 2018-09-03 VITALS — BP 122/84 | HR 86 | Ht 60.0 in | Wt 106.0 lb

## 2018-09-03 DIAGNOSIS — R269 Unspecified abnormalities of gait and mobility: Secondary | ICD-10-CM

## 2018-09-03 DIAGNOSIS — R569 Unspecified convulsions: Secondary | ICD-10-CM | POA: Diagnosis not present

## 2018-09-03 DIAGNOSIS — G40909 Epilepsy, unspecified, not intractable, without status epilepticus: Secondary | ICD-10-CM | POA: Insufficient documentation

## 2018-09-03 NOTE — Progress Notes (Signed)
I have read the note, and I agree with the clinical assessment and plan.  Necie Wilcoxson K Daneille Desilva   

## 2018-09-03 NOTE — Patient Instructions (Addendum)
Your Plan:  Continue Lamictal  Blood work today If your symptoms worsen or you develop new symptoms please let us know.    Thank you for coming to see us at [facility]. I hope we have been able to provide you high quality care today.  You may receive a patient satisfaction survey over the next few weeks. We would appreciate your feedback and comments so that we may continue to improve ourselves and the health of our patients.  

## 2018-09-03 NOTE — Progress Notes (Signed)
PATIENT: Brandy Gutierrez DOB: [birth year]  REASON FOR VISIT: follow up HISTORY FROM: patient  Chief Complaint  Patient presents with  . Follow-up    6 month follow up. Husband present. New room. No new concerns at this time.      HISTORY OF PRESENT ILLNESS: Today 09/03/18 Brandy Gutierrez is a 58 year old female here today for follow up on seizures.  She is tolerating Lamictal 150mg  twice daily. She had aneurysm coiling in 06/2018. She did well with surgery, however, she has had more trouble with pain and balance she made  Dr Estanislado Pandy aware. She fell a couple of days ago as she was reaching to get a container out of the cabinet. She lost her balance and fell backwards onto her bottom. . She did not hit her head. She is bruised on her bottom and feels tender under her breast. She denies trouble breathing or chest pain. She is not dizzy. She completed about 6 visits with PT that did seem to help. She is using a cane regularly. She does have neuropathy in both feet. She is followed regularly by her PCP. She does not have much of an appetite. This has been ongoing for years.  She returns today for evaluation.  HISTORY: 02/05/18  Brandy Gutierrez is a 58 year old female with a history of fibromyalgia, seizures and chronic pain.  She returns today for follow-up.  She remains on Lamictal well.  She denies any seizure events.  He continues to have trouble with her balance.  She uses a cane.  Denies any falls.  She did see Dr. Estanislado Pandy and he does want to intervene on her aneurysm however he recommended that she gained weight before he would do the surgery.  Patient states that she is now at the weight he recommended and he plans to get back in touch.  She returns today for evaluation.  REVIEW OF SYSTEMS: Out of a complete 14 system review of symptoms, the patient complains only of the following symptoms, chronic pain, gait difficulty, trouble sleeping, decreased appetite, numbness in bilateral feet,  and all other reviewed systems are negative.  ALLERGIES: Allergies  Allergen Reactions  . Sulfa Antibiotics Hives and Shortness Of Breath  . Metronidazole Other (See Comments)    Unknown reaction type  . Cymbalta [Duloxetine Hcl] Other (See Comments)    Confusion    HOME MEDICATIONS: Outpatient Medications Prior to Visit  Medication Sig Dispense Refill  . albuterol (PROAIR HFA) 108 (90 Base) MCG/ACT inhaler Inhale 2 puffs into the lungs every 6 (six) hours as needed for wheezing or shortness of breath.    Marland Kitchen amitriptyline (ELAVIL) 10 MG tablet Take 10 mg by mouth at bedtime.  0  . amLODipine (NORVASC) 2.5 MG tablet Take 2.5 mg by mouth daily.   0  . aspirin EC 81 MG tablet Take 81 mg by mouth daily.    . Biotin 10000 MCG TABS Take 10,000 mcg by mouth 2 (two) times daily.    . Cholecalciferol (VITAMIN D-3) 1000 units CAPS Take 1,000 Units by mouth daily.     . clopidogrel (PLAVIX) 75 MG tablet Take 75 mg by mouth daily.  3  . colestipol (COLESTID) 1 g tablet Take 0.5 g by mouth daily.   3  . cyanocobalamin (,VITAMIN B-12,) 1000 MCG/ML injection Inject 1,000 mcg into the muscle every 30 (thirty) days.    . ferrous sulfate 325 (65 FE) MG tablet Take 325 mg by mouth 2 (two) times daily.     Marland Kitchen  fexofenadine (ALLEGRA ALLERGY) 180 MG tablet Take 180 mg by mouth daily.    . folic acid (FOLVITE) 1 MG tablet Take 1 mg by mouth daily.    Marland Kitchen gabapentin (NEURONTIN) 600 MG tablet Take 600 mg by mouth 4 (four) times daily.    Marland Kitchen ibuprofen (ADVIL,MOTRIN) 800 MG tablet Take 800 mg by mouth 3 (three) times daily as needed for pain.  0  . ipratropium-albuterol (DUONEB) 0.5-2.5 (3) MG/3ML SOLN Take 3 mLs by nebulization every 6 (six) hours as needed (for wheezing/shortness of breath.).    Marland Kitchen lamoTRIgine (LAMICTAL) 25 MG tablet Take 50 mg by mouth 2 (two) times daily.     . mirtazapine (REMERON) 15 MG tablet Take 15 mg by mouth at bedtime.    . Multiple Vitamin (MULTIVITAMIN WITH MINERALS) TABS tablet Take 1  tablet by mouth daily at 8 pm.    . propranolol (INDERAL) 20 MG tablet Take 20 mg by mouth 2 (two) times daily.  1  . rosuvastatin (CRESTOR) 20 MG tablet Take 20 mg by mouth at bedtime.  1  . thiamine (VITAMIN B-1) 100 MG tablet Take 100 mg by mouth daily.     No facility-administered medications prior to visit.     PAST MEDICAL HISTORY: Past Medical History:  Diagnosis Date  . Abnormality of gait 09/10/2016  . Anemia   . Anterior cerebral aneurysm 08/06/2017  . Asthma due to seasonal allergies   . Cervical cancer (HCC)    dx at age 26  . Chronic insomnia   . COPD (chronic obstructive pulmonary disease) ([place])   . Fibromyalgia   . GERD (gastroesophageal reflux disease)   . Headache    "everyday" - for years   . History of kidney stones    passed  . Hypertension   . Memory disorder 04/08/2014  . Pneumonia   . Seizure ([place])   . Somnambulism   . Stomach ulcer   . TIA (transient ischemic attack)    x 6  . Tremor   . Vitamin B12 deficiency     PAST SURGICAL HISTORY: Past Surgical History:  Procedure Laterality Date  . APPENDECTOMY     taken out with hysterectomy  . CHOLECYSTECTOMY    . COLONOSCOPY    . IR ANGIO INTRA EXTRACRAN SEL COM CAROTID INNOMINATE UNI L MOD SED  07/09/2018  . IR ANGIO INTRA EXTRACRAN SEL INTERNAL CAROTID UNI R MOD SED  07/09/2018  . IR ANGIO VERTEBRAL SEL VERTEBRAL BILAT MOD SED  07/09/2018  . IR ANGIOGRAM FOLLOW UP STUDY  07/09/2018  . IR ANGIOGRAM FOLLOW UP STUDY  07/09/2018  . IR ANGIOGRAM FOLLOW UP STUDY  07/09/2018  . IR ANGIOGRAM FOLLOW UP STUDY  07/09/2018  . IR ANGIOGRAM FOLLOW UP STUDY  07/09/2018  . IR ANGIOGRAM FOLLOW UP STUDY  07/09/2018  . IR NEURO EACH ADD'L AFTER BASIC UNI RIGHT (MS)  07/09/2018  . IR RADIOLOGIST EVAL & MGMT  08/08/2017  . IR TRANSCATH/EMBOLIZ  07/09/2018  . RADIOLOGY WITH ANESTHESIA N/A 07/09/2018   Procedure: RADIOLOGY WITH ANESTHESIA  EMBOLIZATION;  Surgeon: Luanne Bras, MD;  Location: [facility];  Service:  Radiology;  Laterality: N/A;  . SALPINGOOPHORECTOMY     age 76 for cervical cancer  . TOTAL ABDOMINAL HYSTERECTOMY     age 66 for cervical cancer    FAMILY HISTORY: Family History  Problem Relation Age of Onset  . Cancer Mother 34       breast  . Cancer Maternal Aunt 70  breast  . Cancer Maternal Uncle 62       colon  . Cancer Maternal Grandmother 22       breast  . Stroke Maternal Grandmother   . Cancer Maternal Aunt 40       unknown type of cancer  . Cancer Maternal Uncle 68       GI cancer (? pancreatic)  . Cancer Maternal Aunt 54       breast  . Cancer Father   . Hypertension Sister   . Hypertension Brother   . Cancer Maternal Grandfather 65       throat - smoker  . Cancer Cousin 22       breast  . Cancer Cousin 46       breast    SOCIAL HISTORY: Social History   Socioeconomic History  . Marital status: Married    Spouse name: Not on file  . Number of children: 1  . Years of education: GED  . Highest education level: Not on file  Occupational History  . Occupation: Financial controller: [employer]  Social Needs  . Financial resource strain: Not on file  . Food insecurity:    Worry: Not on file    Inability: Not on file  . Transportation needs:    Medical: Not on file    Non-medical: Not on file  Tobacco Use  . Smoking status: Former Smoker    Packs/day: 1.00    Years: 28.00    Pack years: 28.00    Types: Cigarettes    Last attempt to quit: 06/19/2014    Years since quitting: 4.2  . Smokeless tobacco: Never Used  Substance and Sexual Activity  . Alcohol use: Not Currently    Alcohol/week: 0.0 standard drinks  . Drug use: No  . Sexual activity: Not on file  Lifestyle  . Physical activity:    Days per week: Not on file    Minutes per session: Not on file  . Stress: Not on file  Relationships  . Social connections:    Talks on phone: Not on file    Gets together: Not on file    Attends religious service: Not on file     Active member of club or organization: Not on file    Attends meetings of clubs or organizations: Not on file    Relationship status: Not on file  . Intimate partner violence:    Fear of current or ex partner: Not on file    Emotionally abused: Not on file    Physically abused: Not on file    Forced sexual activity: Not on file  Other Topics Concern  . Not on file  Social History Narrative   Patient is right handed.   Patient does not drink caffeine.      PHYSICAL EXAM  Vitals:   09/03/18 0927  BP: 122/84  Pulse: 86  Weight: 106 lb (48.1 kg)  Height: 5' (1.524 m)   Body mass index is 20.7 kg/m.  Generalized: Well developed, in no acute distress  Cardiology: normal rate and rhythm, no murmur noted Neurological examination  Mentation: Alert oriented to time, place, history taking. Follows all commands speech and language fluent Cranial nerve II-XII: Pupils were equal round reactive to light. Extraocular movements were full, visual field were full on confrontational test. Some difficulty following instructions with peripheral field testing. Facial sensation and strength were normal. Uvula tongue midline. Shoulder shrug normal and symmetric. Motor: The motor  testing reveals 4/5 of BUE and 3/5 BLE. Good symmetric motor tone is noted throughout.  Sensory: Sensory testing is intact to soft touch on all 4 extremities. No evidence of extinction is noted.  Coordination: Cerebellar testing reveals good finger-nose-finger and heel-to-shin bilaterally.  Gait and station: Wide based gait.  She uses a cane when ambulating.  Romberg is negative. No drift is seen.     DIAGNOSTIC DATA (LABS, IMAGING, TESTING) - I reviewed patient records, labs, notes, testing and imaging myself where available.  No flowsheet data found.   Lab Results  Component Value Date   WBC 3.4 (L) 07/11/2018   HGB 8.8 (L) 07/11/2018   HCT 28.3 (L) 07/11/2018   MCV 102.9 (H) 07/11/2018   PLT 128 (L) 07/11/2018       Component Value Date/Time   NA 140 07/10/2018 0443   NA 141 02/05/2018 1007   K 3.6 07/10/2018 0443   CL 116 (H) 07/10/2018 0443   CO2 19 (L) 07/10/2018 0443   GLUCOSE 70 07/10/2018 0443   BUN 8 07/10/2018 0443   BUN 15 02/05/2018 1007   CREATININE 0.61 07/10/2018 0443   CALCIUM 7.3 (L) 07/10/2018 0443   PROT 7.5 02/05/2018 1007   ALBUMIN 4.7 02/05/2018 1007   AST 22 02/05/2018 1007   ALT 14 02/05/2018 1007   ALKPHOS 61 02/05/2018 1007   BILITOT 0.3 02/05/2018 1007   GFRNONAA >60 07/10/2018 0443   GFRAA >60 07/10/2018 0443    Lab Results  Component Value Date   TSH 2.050 07/29/2014       ASSESSMENT AND PLAN 58 y.o. year old female  has a past medical history of Abnormality of gait (09/10/2016), Anemia, Anterior cerebral aneurysm (08/06/2017), Asthma due to seasonal allergies, Cervical cancer ([place]), Chronic insomnia, COPD (chronic obstructive pulmonary disease) (HCC), Fibromyalgia, GERD (gastroesophageal reflux disease), Headache, History of kidney stones, Hypertension, Memory disorder (04/08/2014), Pneumonia, Seizure ([place]), Somnambulism, Stomach ulcer, TIA (transient ischemic attack), Tremor, and Vitamin B12 deficiency. here with   1. Seizures  2. Balance disorder  We will continue Lamictal as prescribed.  I will check blood work today.. Advised patient to continue to use cane when walking even when she is in the home.. Fall precautions discussed. Discussed maintaining a healthy weight. Encouraged her to consider protein shakes to aide in obtain adequate protein.   I spent 15 minutes with the patient. 50% of this time was spent counseling and educating patient on plan of care and medications.       Ward Givens, MSN, NP-C 09/03/2018, 3:54 PM [facility] [address], [place], [place] [address] [phone]

## 2018-09-04 ENCOUNTER — Telehealth: Payer: Self-pay | Admitting: Adult Health

## 2018-09-04 LAB — CBC WITH DIFFERENTIAL/PLATELET
BASOS ABS: 0.1 10*3/uL (ref 0.0–0.2)
Basos: 2 %
EOS (ABSOLUTE): 0.2 10*3/uL (ref 0.0–0.4)
Eos: 5 %
HEMOGLOBIN: 12.8 g/dL (ref 11.1–15.9)
Hematocrit: 37 % (ref 34.0–46.6)
IMMATURE GRANS (ABS): 0 10*3/uL (ref 0.0–0.1)
IMMATURE GRANULOCYTES: 0 %
LYMPHS: 34 %
Lymphocytes Absolute: 1.6 10*3/uL (ref 0.7–3.1)
MCH: 33.9 pg — AB (ref 26.6–33.0)
MCHC: 34.6 g/dL (ref 31.5–35.7)
MCV: 98 fL — ABNORMAL HIGH (ref 79–97)
MONOCYTES: 12 %
Monocytes Absolute: 0.6 10*3/uL (ref 0.1–0.9)
NEUTROS ABS: 2.2 10*3/uL (ref 1.4–7.0)
NEUTROS PCT: 47 %
Platelets: 151 10*3/uL (ref 150–450)
RBC: 3.78 x10E6/uL (ref 3.77–5.28)
RDW: 13.8 % (ref 11.7–15.4)
WBC: 4.7 10*3/uL (ref 3.4–10.8)

## 2018-09-04 LAB — COMPREHENSIVE METABOLIC PANEL
ALT: 53 IU/L — AB (ref 0–32)
AST: 90 IU/L — AB (ref 0–40)
Albumin/Globulin Ratio: 1.8 (ref 1.2–2.2)
Albumin: 4.5 g/dL (ref 3.5–5.5)
Alkaline Phosphatase: 55 IU/L (ref 39–117)
BUN/Creatinine Ratio: 28 — ABNORMAL HIGH (ref 9–23)
BUN: 24 mg/dL (ref 6–24)
Bilirubin Total: 0.3 mg/dL (ref 0.0–1.2)
CALCIUM: 10 mg/dL (ref 8.7–10.2)
CO2: 22 mmol/L (ref 20–29)
Chloride: 101 mmol/L (ref 96–106)
Creatinine, Ser: 0.86 mg/dL (ref 0.57–1.00)
GFR calc non Af Amer: 75 mL/min/{1.73_m2} (ref >59)
GFR, EST AFRICAN AMERICAN: 87 mL/min/{1.73_m2} (ref >59)
GLUCOSE: 71 mg/dL (ref 65–99)
Globulin, Total: 2.5 g/dL (ref 1.5–4.5)
Potassium: 4.8 mmol/L (ref 3.5–5.2)
Sodium: 142 mmol/L (ref 134–144)
TOTAL PROTEIN: 7 g/dL (ref 6.0–8.5)

## 2018-09-04 LAB — LAMOTRIGINE LEVEL: LAMOTRIGINE LVL: 4.2 ug/mL (ref 2.0–20.0)

## 2018-09-04 NOTE — Telephone Encounter (Signed)
Yes she can.

## 2018-09-04 NOTE — Telephone Encounter (Signed)
Pt states she failed to ask on yesterday if Jinny Blossom thinks its okay for her to get the Flu shot.  Please call

## 2018-09-07 NOTE — Telephone Encounter (Signed)
Spoke with patient and advised her per NP she may get flu shot. Patient verbalized understanding, appreciation.

## 2018-09-09 ENCOUNTER — Telehealth: Payer: Self-pay | Admitting: Adult Health

## 2018-09-09 DIAGNOSIS — R269 Unspecified abnormalities of gait and mobility: Secondary | ICD-10-CM

## 2018-09-09 DIAGNOSIS — R748 Abnormal levels of other serum enzymes: Secondary | ICD-10-CM

## 2018-09-09 DIAGNOSIS — R569 Unspecified convulsions: Secondary | ICD-10-CM

## 2018-09-09 NOTE — Telephone Encounter (Signed)
I called the patient.  Her AST and ALT is slightly elevated.  The patient denies starting any new medication.  Denies drinking alcohol.  She will come back in 1 month to recheck AST and ALT.  I have placed an order.  Please call patient in 1 month for lab work.

## 2018-09-28 DIAGNOSIS — S20222A Contusion of left back wall of thorax, initial encounter: Secondary | ICD-10-CM | POA: Diagnosis not present

## 2018-10-12 ENCOUNTER — Telehealth: Payer: Self-pay | Admitting: Neurology

## 2018-10-12 NOTE — Telephone Encounter (Signed)
Patient called over the weekend, I called back but received answering machine and urged to go to ED.  Message said "unsure right hand losing grip". Thanks

## 2018-10-12 NOTE — Telephone Encounter (Signed)
I contacted the pt and left a vm to schedule f/u visit. Patient will be offered work in visit on 10/30/18 at 12 pm with check in time for 11:30 am.

## 2018-10-12 NOTE — Telephone Encounter (Signed)
I called the patient.  2 weeks ago, she developed painless onset of weakness of the right hand without numbness.  She believes that these symptoms came on suddenly during the day, she did not wake up with the symptoms.  She was dropping things from the hand frequently, no other weakness of the leg or the face or change in speech.  I will get the patient worked in for an evaluation, she currently believes that the weakness has resolved, but it took 2 weeks for this to happen.

## 2018-10-13 NOTE — Telephone Encounter (Signed)
I took call from phone room staff to discuss appt. Patient was advised an earlier work in appointment for 10/16/18 became available at 12 pm with a check in time of 11:30 am. Patient was agreeable and had no further questions/concerns.

## 2018-10-16 ENCOUNTER — Encounter: Payer: Self-pay | Admitting: Neurology

## 2018-10-16 ENCOUNTER — Ambulatory Visit: Payer: Medicare Other | Admitting: Neurology

## 2018-10-16 ENCOUNTER — Ambulatory Visit
Admission: RE | Admit: 2018-10-16 | Discharge: 2018-10-16 | Disposition: A | Discharge status: Home / Self Care | Payer: Medicare Other | Source: Ambulatory Visit | Attending: Neurology | Admitting: Neurology

## 2018-10-16 VITALS — BP 140/82 | HR 78 | Ht 60.0 in | Wt 109.0 lb

## 2018-10-16 DIAGNOSIS — M546 Pain in thoracic spine: Secondary | ICD-10-CM | POA: Diagnosis not present

## 2018-10-16 DIAGNOSIS — G459 Transient cerebral ischemic attack, unspecified: Secondary | ICD-10-CM

## 2018-10-16 DIAGNOSIS — M6281 Muscle weakness (generalized): Secondary | ICD-10-CM

## 2018-10-16 DIAGNOSIS — S299XXA Unspecified injury of thorax, initial encounter: Secondary | ICD-10-CM | POA: Diagnosis not present

## 2018-10-16 DIAGNOSIS — M797 Fibromyalgia: Secondary | ICD-10-CM

## 2018-10-16 DIAGNOSIS — I671 Cerebral aneurysm, nonruptured: Secondary | ICD-10-CM

## 2018-10-16 DIAGNOSIS — Z5181 Encounter for therapeutic drug level monitoring: Secondary | ICD-10-CM

## 2018-10-16 DIAGNOSIS — R269 Unspecified abnormalities of gait and mobility: Secondary | ICD-10-CM

## 2018-10-16 DIAGNOSIS — R945 Abnormal results of liver function studies: Secondary | ICD-10-CM | POA: Diagnosis not present

## 2018-10-16 DIAGNOSIS — R7989 Other specified abnormal findings of blood chemistry: Secondary | ICD-10-CM

## 2018-10-16 DIAGNOSIS — M549 Dorsalgia, unspecified: Secondary | ICD-10-CM | POA: Diagnosis not present

## 2018-10-16 NOTE — Progress Notes (Signed)
Reason for visit: Onset of right hand weakness, history of cerebral aneurysm, peripheral neuropathy, gait disorder, memory disorder, history of alcohol overuse  Brandy Gutierrez is a 58 y.o. female  History of present illness:  Brandy Gutierrez is a 58 year old right-handed white female with a history of a peripheral neuropathy associated with a significant gait disorder.  The patient uses a cane for ambulation, but she continues to fall frequently at home, usually when she is not using her cane.  The patient has had a history of an anterior cerebral artery aneurysm on the right that was stented and obliterated.  The patient has done well following this.  She does have a history of fibromyalgia and a history of seizures, no recent recurrence of the seizures has been noted.  She has a mild underlying memory disorder.  She lives at home with her husband.  The patient continues to drink at least 2 glasses of wine at night.  The patient had sudden painless onset of right hand weakness and clumsiness that began about 3 weeks ago, the episode lasted about 4 or 5 days and then improved only to return after another couple days lasting for several more days with subsequent improvement.  The right hand weakness has normalized currently, she denied any numbness or pain in the hand or arm.  She has no neck pain.  She has not noted any weakness on the legs or any change in balance.  The patient reported no headache or vision changes or changes in speech.  The husband had noted that when she was writing, she would leave off the last word in a sentence oftentimes.  The patient was dropping things frequently.  The patient comes into the office today for an evaluation.  Past Medical History:  Diagnosis Date  . Abnormality of gait 09/10/2016  . Anemia   . Anterior cerebral aneurysm 08/06/2017  . Asthma due to seasonal allergies   . Cervical cancer (HCC)    dx at age 72  . Chronic insomnia   . COPD (chronic  obstructive pulmonary disease) ([place])   . Fibromyalgia   . GERD (gastroesophageal reflux disease)   . Headache    "everyday" - for years   . History of kidney stones    passed  . Hypertension   . Memory disorder 04/08/2014  . Pneumonia   . Seizure ([place])   . Somnambulism   . Stomach ulcer   . TIA (transient ischemic attack)    x 6  . Tremor   . Vitamin B12 deficiency     Past Surgical History:  Procedure Laterality Date  . APPENDECTOMY     taken out with hysterectomy  . CHOLECYSTECTOMY    . COLONOSCOPY    . IR ANGIO INTRA EXTRACRAN SEL COM CAROTID INNOMINATE UNI L MOD SED  07/09/2018  . IR ANGIO INTRA EXTRACRAN SEL INTERNAL CAROTID UNI R MOD SED  07/09/2018  . IR ANGIO VERTEBRAL SEL VERTEBRAL BILAT MOD SED  07/09/2018  . IR ANGIOGRAM FOLLOW UP STUDY  07/09/2018  . IR ANGIOGRAM FOLLOW UP STUDY  07/09/2018  . IR ANGIOGRAM FOLLOW UP STUDY  07/09/2018  . IR ANGIOGRAM FOLLOW UP STUDY  07/09/2018  . IR ANGIOGRAM FOLLOW UP STUDY  07/09/2018  . IR ANGIOGRAM FOLLOW UP STUDY  07/09/2018  . IR NEURO EACH ADD'L AFTER BASIC UNI RIGHT (MS)  07/09/2018  . IR RADIOLOGIST EVAL & MGMT  08/08/2017  . IR TRANSCATH/EMBOLIZ  07/09/2018  . RADIOLOGY WITH  ANESTHESIA N/A 07/09/2018   Procedure: RADIOLOGY WITH ANESTHESIA  EMBOLIZATION;  Surgeon: Luanne Bras, MD;  Location: [facility];  Service: Radiology;  Laterality: N/A;  . SALPINGOOPHORECTOMY     age 46 for cervical cancer  . TOTAL ABDOMINAL HYSTERECTOMY     age 75 for cervical cancer    Family History  Problem Relation Age of Onset  . Cancer Mother 62       breast  . Cancer Maternal Aunt 72       breast  . Cancer Maternal Uncle 35       colon  . Cancer Maternal Grandmother 6       breast  . Stroke Maternal Grandmother   . Cancer Maternal Aunt 40       unknown type of cancer  . Cancer Maternal Uncle 68       GI cancer (? pancreatic)  . Cancer Maternal Aunt 17       breast  . Cancer Father   . Hypertension Sister   .  Hypertension Brother   . Cancer Maternal Grandfather 65       throat - smoker  . Cancer Cousin 22       breast  . Cancer Cousin 32       breast    Social history:  reports that she quit smoking about 4 years ago. Her smoking use included cigarettes. She has a 28.00 pack-year smoking history. She has never used smokeless tobacco. She reports previous alcohol use. She reports that she does not use drugs.  Medications:  Prior to Admission medications   Medication Sig Start Date End Date Taking? Authorizing Provider  albuterol (PROAIR HFA) 108 (90 Base) MCG/ACT inhaler Inhale 2 puffs into the lungs every 6 (six) hours as needed for wheezing or shortness of breath.   Yes [provider]  amitriptyline (ELAVIL) 10 MG tablet Take 10 mg by mouth at bedtime. 05/19/18  Yes [provider]  amLODipine (NORVASC) 2.5 MG tablet Take 2.5 mg by mouth daily.  04/28/18  Yes [provider]  aspirin EC 81 MG tablet Take 81 mg by mouth daily.   Yes [provider]  Biotin 10000 MCG TABS Take 10,000 mcg by mouth 2 (two) times daily.   Yes [provider]  Cholecalciferol (VITAMIN D-3) 1000 units CAPS Take 1,000 Units by mouth daily.    Yes [provider]  clopidogrel (PLAVIX) 75 MG tablet Take 75 mg by mouth daily. 06/04/18  Yes [provider]  colestipol (COLESTID) 1 g tablet Take 0.5 g by mouth daily.  05/06/18  Yes [provider]  cyanocobalamin (,VITAMIN B-12,) 1000 MCG/ML injection Inject 1,000 mcg into the muscle every 30 (thirty) days.   Yes [provider]  ferrous sulfate 325 (65 FE) MG tablet Take 325 mg by mouth 2 (two) times daily.    Yes [provider]  fexofenadine (ALLEGRA ALLERGY) 180 MG tablet Take 180 mg by mouth daily.   Yes [provider]  folic acid (FOLVITE) 1 MG tablet Take 1 mg by mouth daily. 03/31/14  Yes [provider]  gabapentin (NEURONTIN) 600 MG tablet Take 600 mg by mouth  4 (four) times daily.   Yes [provider]  ibuprofen (ADVIL,MOTRIN) 800 MG tablet Take 800 mg by mouth 3 (three) times daily as needed for pain. 05/06/18  Yes [provider]  ipratropium-albuterol (DUONEB) 0.5-2.5 (3) MG/3ML SOLN Take 3 mLs by nebulization every 6 (six) hours as needed (  for wheezing/shortness of breath.).   Yes [provider]  lamoTRIgine (LAMICTAL) 25 MG tablet Take 50 mg by mouth 2 (two) times daily.    Yes [provider]  mirtazapine (REMERON) 15 MG tablet Take 15 mg by mouth at bedtime.   Yes [provider]  Multiple Vitamin (MULTIVITAMIN WITH MINERALS) TABS tablet Take 1 tablet by mouth daily at 8 pm.   Yes [provider]  propranolol (INDERAL) 20 MG tablet Take 20 mg by mouth 2 (two) times daily. 06/03/18  Yes [provider]  rosuvastatin (CRESTOR) 20 MG tablet Take 20 mg by mouth at bedtime. 05/01/18  Yes [provider]  thiamine (VITAMIN B-1) 100 MG tablet Take 100 mg by mouth daily.   Yes [provider]      Allergies  Allergen Reactions  . Sulfa Antibiotics Hives and Shortness Of Breath  . Metronidazole Other (See Comments)    Unknown reaction type  . Cymbalta [Duloxetine Hcl] Other (See Comments)    Confusion    ROS:  Out of a complete 14 system review of symptoms, the patient complains only of the following symptoms, and all other reviewed systems are negative.  Decreased activity Incontinence of the bladder, frequency of urination joint swelling, aching muscles, walking difficulty Bruising easily Dizziness    Blood pressure 140/82, pulse 78, height 5' (1.524 m), weight 109 lb (49.4 kg).  Physical Exam  General: The patient is alert and cooperative at the time of the examination.  The patient is thin and emaciated.  Eyes: Pupils are equal, round, and reactive to light. Discs are flat bilaterally.  Neck: The neck is supple, no carotid bruits are  noted.  Respiratory: The respiratory examination is clear.  Cardiovascular: The cardiovascular examination reveals a regular rate and rhythm, no obvious murmurs or rubs are noted.  Skin: Extremities are without significant edema.  The patient has multiple bruises on the abdomen, left hip, back, and left forearm.  Neurologic Exam  Mental status: The patient is alert and oriented x 3 at the time of the examination. The patient has apparent normal recent and remote memory, with an apparently normal attention span and concentration ability.  Cranial nerves: Facial symmetry is present. There is good sensation of the face to pinprick and soft touch bilaterally. The strength of the facial muscles and the muscles to head turning and shoulder shrug are normal bilaterally. Speech is well enunciated, no aphasia or dysarthria is noted. Extraocular movements are full. Visual fields are full. The tongue is midline, and the patient has symmetric elevation of the soft palate. No obvious hearing deficits are noted.  Motor: The motor testing reveals 5 over 5 strength of all 4 extremities. Good symmetric motor tone is noted throughout.  Sensory: Sensory testing is intact to pinprick, soft touch and vibration sensation on the upper extremities, with exception of decreased pinprick on the right hand.  With the lower extremities, there is a stocking pattern pinprick sensory deficit up to the knees bilaterally, significant reduction in vibratory sensation is seen in both feet.  No evidence of extinction is noted.  Coordination: Cerebellar testing reveals good finger-nose-finger and heel-to-shin bilaterally.  Gait and station: Gait is wide-based, unsteady, the patient usually uses a cane for ambulation.  Tandem gait was not attempted.  Romberg is positive, the patient falls backwards.  Reflexes: Deep tendon reflexes are symmetric, but are decreased bilaterally.   Assessment/Plan:  1.  History of sudden onset  right hand weakness  2.  History of peripheral neuropathy  3.  Chronic gait disorder, multiple falls  4.  Memory disorder  5.  History of alcohol overuse  6.  Right anterior communicating artery aneurysm, status post stent procedure and ablation  The patient will undergo MRI of the brain to exclude a small stroke event.  The patient has regained full function of the right hand at this point.  The patient continues to have significant gait instability and multiple falls, she will be set up for home health physical therapy.  She will have blood work today to reevaluate the elevation in liver enzymes that were noted previously, this could potentially be associated with her multiple falls, and may be related to crush injury of the muscle.  The patient continues to have significant midthoracic pain following a fall in January, we will check an x-ray of the thoracic spine.  She admits to drinking at least 2 glasses of wine daily.    Jill Alexanders MD 10/16/2018 11:58 AM  [facility] [address] [place], [place] [address]  Phone [phone] Fax [phone]

## 2018-10-17 LAB — COMPREHENSIVE METABOLIC PANEL
ALBUMIN: 4.5 g/dL (ref 3.8–4.9)
ALK PHOS: 50 IU/L (ref 39–117)
ALT: 15 IU/L (ref 0–32)
AST: 28 IU/L (ref 0–40)
Albumin/Globulin Ratio: 2.1 (ref 1.2–2.2)
BUN / CREAT RATIO: 20 (ref 9–23)
BUN: 17 mg/dL (ref 6–24)
Bilirubin Total: 0.2 mg/dL (ref 0.0–1.2)
CO2: 22 mmol/L (ref 20–29)
CREATININE: 0.87 mg/dL (ref 0.57–1.00)
Calcium: 10 mg/dL (ref 8.7–10.2)
Chloride: 98 mmol/L (ref 96–106)
GFR calc Af Amer: 86 mL/min/{1.73_m2} (ref >59)
GFR calc non Af Amer: 74 mL/min/{1.73_m2} (ref >59)
GLUCOSE: 77 mg/dL (ref 65–99)
Globulin, Total: 2.1 g/dL (ref 1.5–4.5)
Potassium: 4.9 mmol/L (ref 3.5–5.2)
Sodium: 138 mmol/L (ref 134–144)
TOTAL PROTEIN: 6.6 g/dL (ref 6.0–8.5)

## 2018-10-17 LAB — CK: Total CK: 57 U/L (ref 24–173)

## 2018-10-19 ENCOUNTER — Telehealth: Payer: Self-pay | Admitting: Neurology

## 2018-10-19 ENCOUNTER — Telehealth: Payer: Self-pay

## 2018-10-19 NOTE — Telephone Encounter (Signed)
[employer] medicare order sent to GI. No auth they will reach out to the pt to schedule.  °

## 2018-10-19 NOTE — Telephone Encounter (Signed)
I contacted the pt and was able to advised on lab results. Pt verbalized understanding and had no further questions at this time.

## 2018-10-19 NOTE — Telephone Encounter (Signed)
-----   Message from Kathrynn Ducking, MD sent at 10/17/2018 11:31 AM EST -----  The blood work results are unremarkable. Please call the patient. ----- Message ----- From: Lavone Neri Lab Results In Sent: 10/17/2018   5:39 AM EST To: Kathrynn Ducking, MD

## 2018-10-19 NOTE — Telephone Encounter (Signed)
  I called the patient.  The x-ray of the thoracic spine did not show evidence of fracture, the pain may be related to soft tissue injury.  XR thoracic 10/17/18:  IMPRESSION: 1. No acute osseous abnormality or significant degenerative changes.

## 2018-10-26 ENCOUNTER — Ambulatory Visit
Admission: RE | Admit: 2018-10-26 | Discharge: 2018-10-26 | Disposition: A | Discharge status: Home / Self Care | Payer: Medicare Other | Source: Ambulatory Visit | Attending: Neurology | Admitting: Neurology

## 2018-10-26 DIAGNOSIS — G459 Transient cerebral ischemic attack, unspecified: Secondary | ICD-10-CM | POA: Diagnosis not present

## 2018-10-27 ENCOUNTER — Telehealth: Payer: Self-pay | Admitting: Neurology

## 2018-10-27 DIAGNOSIS — Z7982 Long term (current) use of aspirin: Secondary | ICD-10-CM | POA: Diagnosis not present

## 2018-10-27 DIAGNOSIS — Z9181 History of falling: Secondary | ICD-10-CM | POA: Diagnosis not present

## 2018-10-27 DIAGNOSIS — M549 Dorsalgia, unspecified: Secondary | ICD-10-CM | POA: Diagnosis not present

## 2018-10-27 DIAGNOSIS — I10 Essential (primary) hypertension: Secondary | ICD-10-CM | POA: Diagnosis not present

## 2018-10-27 DIAGNOSIS — Z7902 Long term (current) use of antithrombotics/antiplatelets: Secondary | ICD-10-CM | POA: Diagnosis not present

## 2018-10-27 DIAGNOSIS — J449 Chronic obstructive pulmonary disease, unspecified: Secondary | ICD-10-CM | POA: Diagnosis not present

## 2018-10-27 DIAGNOSIS — I69151 Hemiplegia and hemiparesis following nontraumatic intracerebral hemorrhage affecting right dominant side: Secondary | ICD-10-CM | POA: Diagnosis not present

## 2018-10-27 DIAGNOSIS — G629 Polyneuropathy, unspecified: Secondary | ICD-10-CM | POA: Diagnosis not present

## 2018-10-27 DIAGNOSIS — D649 Anemia, unspecified: Secondary | ICD-10-CM | POA: Diagnosis not present

## 2018-10-27 DIAGNOSIS — Z7951 Long term (current) use of inhaled steroids: Secondary | ICD-10-CM | POA: Diagnosis not present

## 2018-10-27 MED ORDER — TRAMADOL HCL 50 MG PO TABS: 50.0000 mg | ORAL_TABLET | Freq: Four times a day (QID) | ORAL | 1 refills | Status: DC | PRN

## 2018-10-27 NOTE — Telephone Encounter (Signed)
I called the patient.  The MRI of the brain does not show any new stroke event that would correlate with the transient arm weakness and clumsiness that she had reported several weeks ago.  She is still having midthoracic pain, she will have a few good days with minimal pain and then the pain will come back again.  X-rays of the thoracic spine did not show fracture.  I will send in a small prescription for Ultram for the next several weeks to help her pain, if this does not improve, further investigation may be necessary.   MRI brain 10/26/18:  IMPRESSION: This MRI of the brain without contrast shows the following: 1.    Mild extent of T2/flair hyperintense foci consistent with mild chronic microvascular ischemic change, stable compared to the 09/18/2016 MRI. 2.    Mild stable generalized cortical atrophy. 3.    There are no acute findings.

## 2018-10-28 ENCOUNTER — Telehealth: Payer: Self-pay | Admitting: Neurology

## 2018-10-28 NOTE — Telephone Encounter (Signed)
Debbie D/[facility] [phone] request VO for PT 2 x 3. Please call to advise

## 2018-10-29 NOTE — Telephone Encounter (Signed)
Spoke with Dr. Jannifer Franklin and he provided verbal ok for PT. I contacted debbie back and relayed verbal order.

## 2018-10-30 ENCOUNTER — Telehealth: Payer: Self-pay | Admitting: Neurology

## 2018-10-30 NOTE — Telephone Encounter (Signed)
I returned patient's call to answering service.  She stated that her TENS unit had stopped working and she wanted a new one right away.  I explained to her that this was after hours and this will not be possible to arrange over the weekend and to call back on Monday for further advice from Dr. Jannifer Franklin and or his nurse.  She seemed upset and hung up.

## 2018-11-02 DIAGNOSIS — M47812 Spondylosis without myelopathy or radiculopathy, cervical region: Secondary | ICD-10-CM | POA: Diagnosis not present

## 2018-11-02 DIAGNOSIS — M9903 Segmental and somatic dysfunction of lumbar region: Secondary | ICD-10-CM | POA: Diagnosis not present

## 2018-11-02 DIAGNOSIS — M9901 Segmental and somatic dysfunction of cervical region: Secondary | ICD-10-CM | POA: Diagnosis not present

## 2018-11-02 DIAGNOSIS — S335XXA Sprain of ligaments of lumbar spine, initial encounter: Secondary | ICD-10-CM | POA: Diagnosis not present

## 2018-11-02 DIAGNOSIS — M9902 Segmental and somatic dysfunction of thoracic region: Secondary | ICD-10-CM | POA: Diagnosis not present

## 2018-11-03 DIAGNOSIS — Z7902 Long term (current) use of antithrombotics/antiplatelets: Secondary | ICD-10-CM | POA: Diagnosis not present

## 2018-11-03 DIAGNOSIS — J449 Chronic obstructive pulmonary disease, unspecified: Secondary | ICD-10-CM | POA: Diagnosis not present

## 2018-11-03 DIAGNOSIS — G629 Polyneuropathy, unspecified: Secondary | ICD-10-CM | POA: Diagnosis not present

## 2018-11-03 DIAGNOSIS — I10 Essential (primary) hypertension: Secondary | ICD-10-CM | POA: Diagnosis not present

## 2018-11-03 DIAGNOSIS — Z9181 History of falling: Secondary | ICD-10-CM | POA: Diagnosis not present

## 2018-11-03 DIAGNOSIS — M549 Dorsalgia, unspecified: Secondary | ICD-10-CM | POA: Diagnosis not present

## 2018-11-03 DIAGNOSIS — D649 Anemia, unspecified: Secondary | ICD-10-CM | POA: Diagnosis not present

## 2018-11-03 DIAGNOSIS — Z7982 Long term (current) use of aspirin: Secondary | ICD-10-CM | POA: Diagnosis not present

## 2018-11-03 DIAGNOSIS — Z7951 Long term (current) use of inhaled steroids: Secondary | ICD-10-CM | POA: Diagnosis not present

## 2018-11-03 DIAGNOSIS — I69151 Hemiplegia and hemiparesis following nontraumatic intracerebral hemorrhage affecting right dominant side: Secondary | ICD-10-CM | POA: Diagnosis not present

## 2018-11-04 DIAGNOSIS — S335XXA Sprain of ligaments of lumbar spine, initial encounter: Secondary | ICD-10-CM | POA: Diagnosis not present

## 2018-11-04 DIAGNOSIS — M9901 Segmental and somatic dysfunction of cervical region: Secondary | ICD-10-CM | POA: Diagnosis not present

## 2018-11-04 DIAGNOSIS — M9902 Segmental and somatic dysfunction of thoracic region: Secondary | ICD-10-CM | POA: Diagnosis not present

## 2018-11-04 DIAGNOSIS — M47812 Spondylosis without myelopathy or radiculopathy, cervical region: Secondary | ICD-10-CM | POA: Diagnosis not present

## 2018-11-04 DIAGNOSIS — M9903 Segmental and somatic dysfunction of lumbar region: Secondary | ICD-10-CM | POA: Diagnosis not present

## 2018-11-05 ENCOUNTER — Telehealth: Payer: Self-pay | Admitting: Neurology

## 2018-11-05 DIAGNOSIS — J449 Chronic obstructive pulmonary disease, unspecified: Secondary | ICD-10-CM | POA: Diagnosis not present

## 2018-11-05 DIAGNOSIS — M549 Dorsalgia, unspecified: Secondary | ICD-10-CM | POA: Diagnosis not present

## 2018-11-05 DIAGNOSIS — Z7982 Long term (current) use of aspirin: Secondary | ICD-10-CM | POA: Diagnosis not present

## 2018-11-05 DIAGNOSIS — Z7902 Long term (current) use of antithrombotics/antiplatelets: Secondary | ICD-10-CM | POA: Diagnosis not present

## 2018-11-05 DIAGNOSIS — G629 Polyneuropathy, unspecified: Secondary | ICD-10-CM | POA: Diagnosis not present

## 2018-11-05 DIAGNOSIS — I69151 Hemiplegia and hemiparesis following nontraumatic intracerebral hemorrhage affecting right dominant side: Secondary | ICD-10-CM | POA: Diagnosis not present

## 2018-11-05 DIAGNOSIS — I10 Essential (primary) hypertension: Secondary | ICD-10-CM | POA: Diagnosis not present

## 2018-11-05 DIAGNOSIS — Z9181 History of falling: Secondary | ICD-10-CM | POA: Diagnosis not present

## 2018-11-05 DIAGNOSIS — Z7951 Long term (current) use of inhaled steroids: Secondary | ICD-10-CM | POA: Diagnosis not present

## 2018-11-05 DIAGNOSIS — D649 Anemia, unspecified: Secondary | ICD-10-CM | POA: Diagnosis not present

## 2018-11-05 NOTE — Telephone Encounter (Signed)
FYI

## 2018-11-05 NOTE — Telephone Encounter (Signed)
Debbie from [place] called in and stated patient had 1 missed visit last week due to pain , pain is 10 out of 10 on every visit with no relieving intervention such as heat , ice , chiropractic care ( only 2 vists ) , she tried the tens unit hers broke and ordered a new one. But patient isnt tolerating therapy well , she had 2 falls earlier in the week no worsen pain in back but tailbone does hurt, did report to PCP but was directed to call [facility].

## 2018-11-05 NOTE — Telephone Encounter (Signed)
I called the patient, talk with the family.  The patient has gained no benefit with physical therapy, the back pain remains severe.  She is going to a chiropractor at this point to see if she can get improvement.  She is on maximum doses of gabapentin.  If the pain does not improve, we may consider MRI of the mid back in the future.

## 2018-11-07 DIAGNOSIS — E162 Hypoglycemia, unspecified: Secondary | ICD-10-CM | POA: Diagnosis not present

## 2018-11-07 DIAGNOSIS — Z8679 Personal history of other diseases of the circulatory system: Secondary | ICD-10-CM | POA: Diagnosis not present

## 2018-11-07 DIAGNOSIS — R578 Other shock: Secondary | ICD-10-CM | POA: Diagnosis not present

## 2018-11-07 DIAGNOSIS — G629 Polyneuropathy, unspecified: Secondary | ICD-10-CM | POA: Diagnosis not present

## 2018-11-07 DIAGNOSIS — K2950 Unspecified chronic gastritis without bleeding: Secondary | ICD-10-CM | POA: Diagnosis not present

## 2018-11-07 DIAGNOSIS — Z791 Long term (current) use of non-steroidal anti-inflammatories (NSAID): Secondary | ICD-10-CM | POA: Diagnosis not present

## 2018-11-07 DIAGNOSIS — R0602 Shortness of breath: Secondary | ICD-10-CM | POA: Diagnosis not present

## 2018-11-07 DIAGNOSIS — I10 Essential (primary) hypertension: Secondary | ICD-10-CM | POA: Diagnosis not present

## 2018-11-07 DIAGNOSIS — R197 Diarrhea, unspecified: Secondary | ICD-10-CM | POA: Diagnosis not present

## 2018-11-07 DIAGNOSIS — K259 Gastric ulcer, unspecified as acute or chronic, without hemorrhage or perforation: Secondary | ICD-10-CM | POA: Diagnosis not present

## 2018-11-07 DIAGNOSIS — J449 Chronic obstructive pulmonary disease, unspecified: Secondary | ICD-10-CM | POA: Diagnosis not present

## 2018-11-07 DIAGNOSIS — Z9049 Acquired absence of other specified parts of digestive tract: Secondary | ICD-10-CM | POA: Diagnosis not present

## 2018-11-07 DIAGNOSIS — M5489 Other dorsalgia: Secondary | ICD-10-CM | POA: Diagnosis not present

## 2018-11-07 DIAGNOSIS — K264 Chronic or unspecified duodenal ulcer with hemorrhage: Secondary | ICD-10-CM | POA: Diagnosis not present

## 2018-11-07 DIAGNOSIS — D500 Iron deficiency anemia secondary to blood loss (chronic): Secondary | ICD-10-CM | POA: Diagnosis not present

## 2018-11-07 DIAGNOSIS — R52 Pain, unspecified: Secondary | ICD-10-CM | POA: Diagnosis not present

## 2018-11-07 DIAGNOSIS — K279 Peptic ulcer, site unspecified, unspecified as acute or chronic, without hemorrhage or perforation: Secondary | ICD-10-CM | POA: Diagnosis not present

## 2018-11-07 DIAGNOSIS — R569 Unspecified convulsions: Secondary | ICD-10-CM | POA: Diagnosis not present

## 2018-11-07 DIAGNOSIS — M797 Fibromyalgia: Secondary | ICD-10-CM | POA: Diagnosis not present

## 2018-11-07 DIAGNOSIS — D649 Anemia, unspecified: Secondary | ICD-10-CM | POA: Diagnosis not present

## 2018-11-07 DIAGNOSIS — I959 Hypotension, unspecified: Secondary | ICD-10-CM | POA: Diagnosis not present

## 2018-11-07 DIAGNOSIS — R29818 Other symptoms and signs involving the nervous system: Secondary | ICD-10-CM | POA: Diagnosis not present

## 2018-11-07 DIAGNOSIS — I671 Cerebral aneurysm, nonruptured: Secondary | ICD-10-CM | POA: Diagnosis not present

## 2018-11-07 DIAGNOSIS — Z8673 Personal history of transient ischemic attack (TIA), and cerebral infarction without residual deficits: Secondary | ICD-10-CM | POA: Diagnosis not present

## 2018-11-07 DIAGNOSIS — R579 Shock, unspecified: Secondary | ICD-10-CM | POA: Diagnosis not present

## 2018-11-07 DIAGNOSIS — K922 Gastrointestinal hemorrhage, unspecified: Secondary | ICD-10-CM | POA: Diagnosis not present

## 2018-11-07 DIAGNOSIS — K3184 Gastroparesis: Secondary | ICD-10-CM | POA: Diagnosis not present

## 2018-11-07 DIAGNOSIS — D62 Acute posthemorrhagic anemia: Secondary | ICD-10-CM | POA: Diagnosis not present

## 2018-11-07 DIAGNOSIS — K254 Chronic or unspecified gastric ulcer with hemorrhage: Secondary | ICD-10-CM | POA: Diagnosis not present

## 2018-11-07 DIAGNOSIS — T39395A Adverse effect of other nonsteroidal anti-inflammatory drugs [NSAID], initial encounter: Secondary | ICD-10-CM | POA: Diagnosis not present

## 2018-11-11 DIAGNOSIS — I10 Essential (primary) hypertension: Secondary | ICD-10-CM | POA: Diagnosis not present

## 2018-11-11 DIAGNOSIS — G629 Polyneuropathy, unspecified: Secondary | ICD-10-CM | POA: Diagnosis not present

## 2018-11-11 DIAGNOSIS — I69151 Hemiplegia and hemiparesis following nontraumatic intracerebral hemorrhage affecting right dominant side: Secondary | ICD-10-CM | POA: Diagnosis not present

## 2018-11-11 DIAGNOSIS — D649 Anemia, unspecified: Secondary | ICD-10-CM | POA: Diagnosis not present

## 2018-11-11 DIAGNOSIS — Z7951 Long term (current) use of inhaled steroids: Secondary | ICD-10-CM | POA: Diagnosis not present

## 2018-11-11 DIAGNOSIS — Z7982 Long term (current) use of aspirin: Secondary | ICD-10-CM | POA: Diagnosis not present

## 2018-11-11 DIAGNOSIS — Z9181 History of falling: Secondary | ICD-10-CM | POA: Diagnosis not present

## 2018-11-11 DIAGNOSIS — Z7902 Long term (current) use of antithrombotics/antiplatelets: Secondary | ICD-10-CM | POA: Diagnosis not present

## 2018-11-11 DIAGNOSIS — J449 Chronic obstructive pulmonary disease, unspecified: Secondary | ICD-10-CM | POA: Diagnosis not present

## 2018-11-11 DIAGNOSIS — M549 Dorsalgia, unspecified: Secondary | ICD-10-CM | POA: Diagnosis not present

## 2018-11-12 ENCOUNTER — Telehealth: Payer: Self-pay | Admitting: Neurology

## 2018-11-12 NOTE — Telephone Encounter (Signed)
Debbie from [place] called to get an ok for her frequency for the pt. Please advise.

## 2018-11-13 NOTE — Telephone Encounter (Signed)
I contacted the Debbie and left a vm advising I needed to verify the pt order as it was not stated in the message. I requested a cb and updated on office hrs.

## 2018-11-13 NOTE — Telephone Encounter (Signed)
PT order for 1 visit for 1 week, 2 for 2 weeks and then 1 for 1 week. Verbal order provided to [place].  Debbie reports pt has been benefiting greatly from PT.

## 2018-11-16 DIAGNOSIS — Z7951 Long term (current) use of inhaled steroids: Secondary | ICD-10-CM | POA: Diagnosis not present

## 2018-11-16 DIAGNOSIS — Z7982 Long term (current) use of aspirin: Secondary | ICD-10-CM | POA: Diagnosis not present

## 2018-11-16 DIAGNOSIS — Z9181 History of falling: Secondary | ICD-10-CM | POA: Diagnosis not present

## 2018-11-16 DIAGNOSIS — M549 Dorsalgia, unspecified: Secondary | ICD-10-CM | POA: Diagnosis not present

## 2018-11-16 DIAGNOSIS — I10 Essential (primary) hypertension: Secondary | ICD-10-CM | POA: Diagnosis not present

## 2018-11-16 DIAGNOSIS — D649 Anemia, unspecified: Secondary | ICD-10-CM | POA: Diagnosis not present

## 2018-11-16 DIAGNOSIS — Z7902 Long term (current) use of antithrombotics/antiplatelets: Secondary | ICD-10-CM | POA: Diagnosis not present

## 2018-11-16 DIAGNOSIS — J449 Chronic obstructive pulmonary disease, unspecified: Secondary | ICD-10-CM | POA: Diagnosis not present

## 2018-11-16 DIAGNOSIS — G629 Polyneuropathy, unspecified: Secondary | ICD-10-CM | POA: Diagnosis not present

## 2018-11-16 DIAGNOSIS — I69151 Hemiplegia and hemiparesis following nontraumatic intracerebral hemorrhage affecting right dominant side: Secondary | ICD-10-CM | POA: Diagnosis not present

## 2018-11-17 DIAGNOSIS — D649 Anemia, unspecified: Secondary | ICD-10-CM | POA: Diagnosis not present

## 2018-11-18 ENCOUNTER — Other Ambulatory Visit: Payer: Self-pay | Admitting: Neurology

## 2018-11-18 DIAGNOSIS — M549 Dorsalgia, unspecified: Secondary | ICD-10-CM | POA: Diagnosis not present

## 2018-11-18 DIAGNOSIS — Z7951 Long term (current) use of inhaled steroids: Secondary | ICD-10-CM | POA: Diagnosis not present

## 2018-11-18 DIAGNOSIS — Z7902 Long term (current) use of antithrombotics/antiplatelets: Secondary | ICD-10-CM | POA: Diagnosis not present

## 2018-11-18 DIAGNOSIS — Z9181 History of falling: Secondary | ICD-10-CM | POA: Diagnosis not present

## 2018-11-18 DIAGNOSIS — G629 Polyneuropathy, unspecified: Secondary | ICD-10-CM | POA: Diagnosis not present

## 2018-11-18 DIAGNOSIS — J449 Chronic obstructive pulmonary disease, unspecified: Secondary | ICD-10-CM | POA: Diagnosis not present

## 2018-11-18 DIAGNOSIS — I69151 Hemiplegia and hemiparesis following nontraumatic intracerebral hemorrhage affecting right dominant side: Secondary | ICD-10-CM | POA: Diagnosis not present

## 2018-11-18 DIAGNOSIS — D649 Anemia, unspecified: Secondary | ICD-10-CM | POA: Diagnosis not present

## 2018-11-18 DIAGNOSIS — Z7982 Long term (current) use of aspirin: Secondary | ICD-10-CM | POA: Diagnosis not present

## 2018-11-18 DIAGNOSIS — I10 Essential (primary) hypertension: Secondary | ICD-10-CM | POA: Diagnosis not present

## 2018-11-19 NOTE — Telephone Encounter (Signed)
The Ultram prescription will be due on 24 November 2018.

## 2018-11-23 DIAGNOSIS — Z9181 History of falling: Secondary | ICD-10-CM | POA: Diagnosis not present

## 2018-11-23 DIAGNOSIS — I10 Essential (primary) hypertension: Secondary | ICD-10-CM | POA: Diagnosis not present

## 2018-11-23 DIAGNOSIS — G629 Polyneuropathy, unspecified: Secondary | ICD-10-CM | POA: Diagnosis not present

## 2018-11-23 DIAGNOSIS — Z7982 Long term (current) use of aspirin: Secondary | ICD-10-CM | POA: Diagnosis not present

## 2018-11-23 DIAGNOSIS — J449 Chronic obstructive pulmonary disease, unspecified: Secondary | ICD-10-CM | POA: Diagnosis not present

## 2018-11-23 DIAGNOSIS — Z7951 Long term (current) use of inhaled steroids: Secondary | ICD-10-CM | POA: Diagnosis not present

## 2018-11-23 DIAGNOSIS — M549 Dorsalgia, unspecified: Secondary | ICD-10-CM | POA: Diagnosis not present

## 2018-11-23 DIAGNOSIS — D649 Anemia, unspecified: Secondary | ICD-10-CM | POA: Diagnosis not present

## 2018-11-23 DIAGNOSIS — Z7902 Long term (current) use of antithrombotics/antiplatelets: Secondary | ICD-10-CM | POA: Diagnosis not present

## 2018-11-23 DIAGNOSIS — I69151 Hemiplegia and hemiparesis following nontraumatic intracerebral hemorrhage affecting right dominant side: Secondary | ICD-10-CM | POA: Diagnosis not present

## 2018-11-24 DIAGNOSIS — D649 Anemia, unspecified: Secondary | ICD-10-CM | POA: Diagnosis not present

## 2018-12-11 DIAGNOSIS — K635 Polyp of colon: Secondary | ICD-10-CM | POA: Diagnosis not present

## 2018-12-11 DIAGNOSIS — K250 Acute gastric ulcer with hemorrhage: Secondary | ICD-10-CM | POA: Diagnosis not present

## 2018-12-15 DIAGNOSIS — D649 Anemia, unspecified: Secondary | ICD-10-CM | POA: Diagnosis not present

## 2018-12-15 DIAGNOSIS — D509 Iron deficiency anemia, unspecified: Secondary | ICD-10-CM | POA: Diagnosis not present

## 2018-12-15 DIAGNOSIS — K250 Acute gastric ulcer with hemorrhage: Secondary | ICD-10-CM | POA: Diagnosis not present

## 2018-12-28 DIAGNOSIS — E785 Hyperlipidemia, unspecified: Secondary | ICD-10-CM | POA: Diagnosis not present

## 2018-12-28 DIAGNOSIS — J449 Chronic obstructive pulmonary disease, unspecified: Secondary | ICD-10-CM | POA: Diagnosis not present

## 2018-12-28 DIAGNOSIS — M545 Low back pain: Secondary | ICD-10-CM | POA: Diagnosis not present

## 2018-12-28 DIAGNOSIS — K210 Gastro-esophageal reflux disease with esophagitis: Secondary | ICD-10-CM | POA: Diagnosis not present

## 2018-12-30 DIAGNOSIS — K253 Acute gastric ulcer without hemorrhage or perforation: Secondary | ICD-10-CM | POA: Diagnosis not present

## 2018-12-30 DIAGNOSIS — K2950 Unspecified chronic gastritis without bleeding: Secondary | ICD-10-CM | POA: Diagnosis not present

## 2018-12-30 DIAGNOSIS — K3189 Other diseases of stomach and duodenum: Secondary | ICD-10-CM | POA: Diagnosis not present

## 2019-01-19 DIAGNOSIS — M545 Low back pain: Secondary | ICD-10-CM | POA: Diagnosis not present

## 2019-01-20 ENCOUNTER — Other Ambulatory Visit: Payer: Self-pay | Admitting: Internal Medicine

## 2019-01-20 ENCOUNTER — Other Ambulatory Visit (HOSPITAL_COMMUNITY): Payer: Self-pay | Admitting: Internal Medicine

## 2019-01-20 DIAGNOSIS — M545 Low back pain, unspecified: Secondary | ICD-10-CM

## 2019-01-20 DIAGNOSIS — M546 Pain in thoracic spine: Secondary | ICD-10-CM

## 2019-01-25 DIAGNOSIS — M546 Pain in thoracic spine: Secondary | ICD-10-CM | POA: Diagnosis not present

## 2019-01-25 DIAGNOSIS — M545 Low back pain: Secondary | ICD-10-CM | POA: Diagnosis not present

## 2019-02-01 ENCOUNTER — Telehealth (HOSPITAL_COMMUNITY): Payer: Self-pay

## 2019-02-01 NOTE — Telephone Encounter (Signed)
Called to schedule f/u mra, no answer, no vm. AW 

## 2019-02-02 DIAGNOSIS — L03032 Cellulitis of left toe: Secondary | ICD-10-CM | POA: Diagnosis not present

## 2019-02-02 DIAGNOSIS — M79675 Pain in left toe(s): Secondary | ICD-10-CM | POA: Diagnosis not present

## 2019-02-02 DIAGNOSIS — M79672 Pain in left foot: Secondary | ICD-10-CM | POA: Diagnosis not present

## 2019-02-09 ENCOUNTER — Other Ambulatory Visit: Payer: Self-pay | Admitting: Neurology

## 2019-02-23 DIAGNOSIS — B353 Tinea pedis: Secondary | ICD-10-CM | POA: Diagnosis not present

## 2019-02-23 DIAGNOSIS — M79672 Pain in left foot: Secondary | ICD-10-CM | POA: Diagnosis not present

## 2019-02-23 DIAGNOSIS — L600 Ingrowing nail: Secondary | ICD-10-CM | POA: Diagnosis not present

## 2019-03-09 ENCOUNTER — Telehealth: Payer: Self-pay | Admitting: Neurology

## 2019-03-09 ENCOUNTER — Other Ambulatory Visit: Payer: Self-pay

## 2019-03-09 ENCOUNTER — Ambulatory Visit: Payer: Medicare Other | Admitting: Neurology

## 2019-03-09 ENCOUNTER — Encounter: Payer: Self-pay | Admitting: Neurology

## 2019-03-09 VITALS — BP 88/64 | HR 77 | Temp 98.6°F | Wt 108.5 lb

## 2019-03-09 DIAGNOSIS — M797 Fibromyalgia: Secondary | ICD-10-CM | POA: Diagnosis not present

## 2019-03-09 DIAGNOSIS — R413 Other amnesia: Secondary | ICD-10-CM | POA: Diagnosis not present

## 2019-03-09 DIAGNOSIS — B353 Tinea pedis: Secondary | ICD-10-CM | POA: Diagnosis not present

## 2019-03-09 DIAGNOSIS — M79672 Pain in left foot: Secondary | ICD-10-CM | POA: Diagnosis not present

## 2019-03-09 DIAGNOSIS — R269 Unspecified abnormalities of gait and mobility: Secondary | ICD-10-CM

## 2019-03-09 DIAGNOSIS — G40909 Epilepsy, unspecified, not intractable, without status epilepticus: Secondary | ICD-10-CM

## 2019-03-09 DIAGNOSIS — G459 Transient cerebral ischemic attack, unspecified: Secondary | ICD-10-CM | POA: Diagnosis not present

## 2019-03-09 DIAGNOSIS — L600 Ingrowing nail: Secondary | ICD-10-CM | POA: Diagnosis not present

## 2019-03-09 NOTE — Progress Notes (Addendum)
Reason for visit: Gait disorder  Brandy Gutierrez is an 58 y.o. female  History of present illness:  Brandy Gutierrez is a 58 year old right-handed white female with a history of a gait disorder, she continues to fall intermittently, the last fall was about 2 weeks ago.  The patient is using a cane for ambulation, but she does not always use this inside the house.  She has evidence clinically of a peripheral neuropathy but her nerve conduction study done in 2015 was unremarkable.  She reports numbness of the feet.  She has ongoing mid thoracic pain following a fall, she claims that she has had MRI studies of the thoracic spine and lumbar spine done at [facility], I do not have the results of this but she was told that this did not show a cause of her pain.  Ultram is effective for the pain.  The pain may come and go.  She denies issues controlling the bowels or the bladder.  She recently had an episode on the weekend of 4 July where she had sudden onset of left facial numbness and facial weakness that lasted about 20 minutes and then cleared.  She felt "funny" during the event.  She has not had any recurrence of this.  The patient returns to this office for an evaluation.  She has a history of a prior right anterior cerebral artery aneurysm ablation procedure.  She has a history of fibromyalgia.  She has undergone physical therapy which did help her balance issues some.  Past Medical History:  Diagnosis Date  . Abnormality of gait 09/10/2016  . Anemia   . Anterior cerebral aneurysm 08/06/2017  . Asthma due to seasonal allergies   . Cervical cancer (HCC)    dx at age 36  . Chronic insomnia   . COPD (chronic obstructive pulmonary disease) ([place])   . Fibromyalgia   . GERD (gastroesophageal reflux disease)   . Headache    "everyday" - for years   . History of kidney stones    passed  . Hypertension   . Memory disorder 04/08/2014  . Pneumonia   . Seizure ([place])   . Somnambulism   .  Stomach ulcer   . TIA (transient ischemic attack)    x 6  . Tremor   . Vitamin B12 deficiency     Past Surgical History:  Procedure Laterality Date  . APPENDECTOMY     taken out with hysterectomy  . CHOLECYSTECTOMY    . COLONOSCOPY    . IR ANGIO INTRA EXTRACRAN SEL COM CAROTID INNOMINATE UNI L MOD SED  07/09/2018  . IR ANGIO INTRA EXTRACRAN SEL INTERNAL CAROTID UNI R MOD SED  07/09/2018  . IR ANGIO VERTEBRAL SEL VERTEBRAL BILAT MOD SED  07/09/2018  . IR ANGIOGRAM FOLLOW UP STUDY  07/09/2018  . IR ANGIOGRAM FOLLOW UP STUDY  07/09/2018  . IR ANGIOGRAM FOLLOW UP STUDY  07/09/2018  . IR ANGIOGRAM FOLLOW UP STUDY  07/09/2018  . IR ANGIOGRAM FOLLOW UP STUDY  07/09/2018  . IR ANGIOGRAM FOLLOW UP STUDY  07/09/2018  . IR NEURO EACH ADD'L AFTER BASIC UNI RIGHT (MS)  07/09/2018  . IR RADIOLOGIST EVAL & MGMT  08/08/2017  . IR TRANSCATH/EMBOLIZ  07/09/2018  . RADIOLOGY WITH ANESTHESIA N/A 07/09/2018   Procedure: RADIOLOGY WITH ANESTHESIA  EMBOLIZATION;  Surgeon: Luanne Bras, MD;  Location: [facility];  Service: Radiology;  Laterality: N/A;  . SALPINGOOPHORECTOMY     age 47 for cervical cancer  .  TOTAL ABDOMINAL HYSTERECTOMY     age 21 for cervical cancer    Family History  Problem Relation Age of Onset  . Cancer Mother 61       breast  . Cancer Maternal Aunt 58       breast  . Cancer Maternal Uncle 81       colon  . Cancer Maternal Grandmother 54       breast  . Stroke Maternal Grandmother   . Cancer Maternal Aunt 40       unknown type of cancer  . Cancer Maternal Uncle 68       GI cancer (? pancreatic)  . Cancer Maternal Aunt 51       breast  . Cancer Father   . Hypertension Sister   . Hypertension Brother   . Cancer Maternal Grandfather 65       throat - smoker  . Cancer Cousin 22       breast  . Cancer Cousin 27       breast    Social history:  reports that she quit smoking about 4 years ago. Her smoking use included cigarettes. She has a 28.00 pack-year smoking  history. She has never used smokeless tobacco. She reports previous alcohol use. She reports that she does not use drugs.    Allergies  Allergen Reactions  . Sulfa Antibiotics Hives and Shortness Of Breath  . Metronidazole Other (See Comments)    Unknown reaction type  . Cymbalta [Duloxetine Hcl] Other (See Comments)    Confusion    Medications:  Prior to Admission medications   Medication Sig Start Date End Date Taking? Authorizing Provider  albuterol (PROAIR HFA) 108 (90 Base) MCG/ACT inhaler Inhale 2 puffs into the lungs every 6 (six) hours as needed for wheezing or shortness of breath.   Yes [provider]  amitriptyline (ELAVIL) 10 MG tablet Take 10 mg by mouth at bedtime. 05/19/18  Yes [provider]  amLODipine (NORVASC) 2.5 MG tablet Take 2.5 mg by mouth daily.  04/28/18  Yes [provider]  aspirin EC 81 MG tablet Take 81 mg by mouth daily.   Yes [provider]  Biotin 10000 MCG TABS Take 10,000 mcg by mouth 2 (two) times daily.   Yes [provider]  Cholecalciferol (VITAMIN D-3) 1000 units CAPS Take 1,000 Units by mouth daily.    Yes [provider]  clopidogrel (PLAVIX) 75 MG tablet Take 75 mg by mouth daily. 06/04/18  Yes [provider]  colestipol (COLESTID) 1 g tablet Take 0.5 g by mouth daily.  05/06/18  Yes [provider]  cyanocobalamin (,VITAMIN B-12,) 1000 MCG/ML injection Inject 1,000 mcg into the muscle every 30 (thirty) days.   Yes [provider]  ferrous sulfate 325 (65 FE) MG tablet Take 325 mg by mouth daily.    Yes [provider]  fexofenadine (ALLEGRA ALLERGY) 180 MG tablet Take 180 mg by mouth daily.   Yes [provider]  folic acid (FOLVITE) 1 MG tablet Take 1 mg by mouth daily. 03/31/14  Yes [provider]  gabapentin (NEURONTIN) 600 MG tablet Take 600 mg by mouth 4 (four) times daily.   Yes [provider]  ipratropium-albuterol  (DUONEB) 0.5-2.5 (3) MG/3ML SOLN Take 3 mLs by nebulization every 6 (six) hours as needed (for wheezing/shortness of breath.).   Yes [provider]  lamoTRIgine (LAMICTAL) 25 MG tablet Take 50 mg by mouth 2 (two) times daily.  Yes [provider]  mirtazapine (REMERON) 15 MG tablet Take 15 mg by mouth at bedtime.   Yes [provider]  Multiple Vitamin (MULTIVITAMIN WITH MINERALS) TABS tablet Take 1 tablet by mouth daily at 8 pm.   Yes [provider]  propranolol (INDERAL) 20 MG tablet Take 20 mg by mouth 2 (two) times daily. 06/03/18  Yes [provider]  rosuvastatin (CRESTOR) 20 MG tablet Take 20 mg by mouth at bedtime. 05/01/18  Yes [provider]  thiamine (VITAMIN B-1) 100 MG tablet Take 100 mg by mouth daily.   Yes [provider]  traMADol (ULTRAM) 50 MG tablet TAKE 1 TABLET BY MOUTH EVERY 6 HOURS AS NEEDED 02/09/19  Yes Kathrynn Ducking, MD    ROS:  Out of a complete 14 system review of symptoms, the patient complains only of the following symptoms, and all other reviewed systems are negative.  Transient facial weakness and numbness Walking problems, balance problems Mid back pain  Weight 108 lb 8 oz (49.2 kg).  Physical Exam  General: The patient is alert and cooperative at the time of the examination.  Skin: No significant peripheral edema is noted.   Neurologic Exam  Mental status: The patient is alert and oriented x 3 at the time of the examination. The patient has apparent normal recent and remote memory, with an apparently normal attention span and concentration ability.   Cranial nerves: Facial symmetry is present. Speech is normal, no aphasia or dysarthria is noted. Extraocular movements are full. Visual fields are full.  Motor: The patient has good strength in all 4 extremities.  Sensory examination: Soft touch sensation is symmetric on the face, arms, and legs.  The patient has significant  impairment of vibration sensation in both feet, normal in the arms.  Coordination: The patient has good finger-nose-finger and heel-to-shin bilaterally.  Gait and station: The patient has a wide-based, unsteady gait, she usually uses a cane for ambulation.  Tandem gait is unsteady.  Romberg is unsteady, the patient has a tendency to fall.  Reflexes: Deep tendon reflexes are symmetric, but are depressed.   MRI brain 10/26/18:  IMPRESSION: This MRI of the brain without contrast shows the following: 1. Mild extent of T2/flair hyperintense foci consistent with mild chronic microvascular ischemic change, stable compared to the 09/18/2016 MRI. 2. Mild stable generalized cortical atrophy. 3. There are no acute findings.  * MRI scan images were reviewed online. I agree with the written report.    Assessment/Plan:  1.  Gait disorder, probable peripheral neuropathy  2.  Transient facial weakness and numbness, possible TIA  3.  History of anterior cerebral artery aneurysm ablation  4.  Midthoracic pain  The patient will be set up for CT scan of the brain given the transient event of facial numbness and weakness.  She is on Plavix therapy.  The patient clearly has significant sensory impairment in both feet that is likely the source of her gait problem.  We will try to obtain MRI results of the lumbar spine and thoracic spine from [facility].  The patient will follow-up here in about 4 months.  Addendum: I have received the reports of the MRI of the thoracic spine done on 25 January 2019.  The study was surprisingly unremarkable, no evidence of disc protrusions or foraminal stenosis at any level.  MRI of the lumbar spine was done as well showing a minimal broad-based disc at the L3-4 level with mild bilateral facet arthropathy.  At the L4-5 and L5-S1 level there is also a mild broad-based disc bulge and some mild facet joint arthritis at the L4-5 level, no evidence of nerve root  impingement is seen at any level.  Jill Alexanders MD 03/09/2019 9:41 AM  [facility] [address] [place] [place] [address]  Phone [phone] Fax [phone]

## 2019-03-09 NOTE — Telephone Encounter (Signed)
[employer] medicare order sent to [facility]. No auth they will reach out to the patient to schedule.  

## 2019-03-10 DIAGNOSIS — Z86010 Personal history of colonic polyps: Secondary | ICD-10-CM | POA: Diagnosis not present

## 2019-03-10 DIAGNOSIS — D509 Iron deficiency anemia, unspecified: Secondary | ICD-10-CM | POA: Diagnosis not present

## 2019-03-10 DIAGNOSIS — K250 Acute gastric ulcer with hemorrhage: Secondary | ICD-10-CM | POA: Diagnosis not present

## 2019-03-10 DIAGNOSIS — D649 Anemia, unspecified: Secondary | ICD-10-CM | POA: Diagnosis not present

## 2019-03-11 ENCOUNTER — Telehealth (HOSPITAL_COMMUNITY): Payer: Self-pay

## 2019-03-11 NOTE — Telephone Encounter (Signed)
Called to schedule f/u mra, no answer, no vm. AW 

## 2019-03-16 ENCOUNTER — Telehealth: Payer: Self-pay | Admitting: Neurology

## 2019-03-16 NOTE — Telephone Encounter (Signed)
Patient wants to have her CT done at [facility] in [place] .

## 2019-03-16 NOTE — Telephone Encounter (Signed)
Patient is scheduled at [facility] for 03/18/19 arrival time is 1:00 PM patient is aware of time and day. Their number is [phone].

## 2019-03-17 NOTE — Telephone Encounter (Signed)
Authorization for CT needed by 4pm or pt will have to be r/s  CB# [phone] [id number]  FAX# [phone]

## 2019-03-17 NOTE — Telephone Encounter (Signed)
I spoke to [place] at [facility] and informed her for CT's and MRI's there is no authorization require for [facility] Medicare.

## 2019-03-18 DIAGNOSIS — R51 Headache: Secondary | ICD-10-CM | POA: Diagnosis not present

## 2019-03-18 DIAGNOSIS — I6782 Cerebral ischemia: Secondary | ICD-10-CM | POA: Diagnosis not present

## 2019-03-18 DIAGNOSIS — G459 Transient cerebral ischemic attack, unspecified: Secondary | ICD-10-CM | POA: Diagnosis not present

## 2019-03-22 ENCOUNTER — Other Ambulatory Visit: Payer: Medicare Other

## 2019-03-23 ENCOUNTER — Telehealth: Payer: Self-pay | Admitting: Neurology

## 2019-03-23 NOTE — Telephone Encounter (Signed)
I called the patient.  CT of the head showed no acute changes, no evidence of new infarct.  Patient had transient facial numbness around 4 July.   CT head 03/18/19:  Findings:  No acute territorial infarction, hemorrhage, or intracranial mass.  Atrophy and mild small vessel ischemic changes of the white matter.  Stable ventricular size.  Vascular: Previous stent and aneurysm coiling.  No unexpected calcifications. Skeletal: Normal, negative for fracture or focal lesion. Sinuses/orbits: No acute finding.  Impression: No CT evidence for acute intracranial abnormality.  Atrophy and mild small vessel ischemic changes of the white matter.

## 2019-03-29 DIAGNOSIS — J370 Chronic laryngitis: Secondary | ICD-10-CM | POA: Diagnosis not present

## 2019-03-29 DIAGNOSIS — Z0000 Encounter for general adult medical examination without abnormal findings: Secondary | ICD-10-CM | POA: Diagnosis not present

## 2019-04-05 ENCOUNTER — Other Ambulatory Visit (HOSPITAL_COMMUNITY): Payer: Self-pay | Admitting: Interventional Radiology

## 2019-04-05 ENCOUNTER — Telehealth (HOSPITAL_COMMUNITY): Payer: Self-pay

## 2019-04-05 DIAGNOSIS — I729 Aneurysm of unspecified site: Secondary | ICD-10-CM

## 2019-04-05 NOTE — Telephone Encounter (Signed)
Called to schedule f/u mra, no answer, no vm. AW 

## 2019-04-08 DIAGNOSIS — L03031 Cellulitis of right toe: Secondary | ICD-10-CM | POA: Diagnosis not present

## 2019-04-08 DIAGNOSIS — M79674 Pain in right toe(s): Secondary | ICD-10-CM | POA: Diagnosis not present

## 2019-04-08 DIAGNOSIS — M79671 Pain in right foot: Secondary | ICD-10-CM | POA: Diagnosis not present

## 2019-04-15 DIAGNOSIS — M79671 Pain in right foot: Secondary | ICD-10-CM | POA: Diagnosis not present

## 2019-04-15 DIAGNOSIS — M79674 Pain in right toe(s): Secondary | ICD-10-CM | POA: Diagnosis not present

## 2019-04-15 DIAGNOSIS — L03031 Cellulitis of right toe: Secondary | ICD-10-CM | POA: Diagnosis not present

## 2019-04-21 ENCOUNTER — Telehealth: Payer: Self-pay | Admitting: Student

## 2019-04-21 NOTE — Telephone Encounter (Signed)
NIR.  Received call from patient requesting medication (Plavix) refill.  Called [facility] [phone]) at [phone] to fill prescription- Plavix 75 mg tablets, take one tablet by mouth once daily, dispense 30 tablets with 3 refills.   Bea Graff Louk, PA-C 04/21/2019, 10:09 AM

## 2019-04-29 DIAGNOSIS — M79671 Pain in right foot: Secondary | ICD-10-CM | POA: Diagnosis not present

## 2019-04-29 DIAGNOSIS — M79674 Pain in right toe(s): Secondary | ICD-10-CM | POA: Diagnosis not present

## 2019-04-29 DIAGNOSIS — L03031 Cellulitis of right toe: Secondary | ICD-10-CM | POA: Diagnosis not present

## 2019-05-01 ENCOUNTER — Other Ambulatory Visit: Payer: Self-pay | Admitting: Neurology

## 2019-05-05 ENCOUNTER — Ambulatory Visit (HOSPITAL_COMMUNITY)
Admission: RE | Admit: 2019-05-05 | Discharge: 2019-05-05 | Disposition: A | Discharge status: Home / Self Care | Payer: Medicare Other | Source: Ambulatory Visit | Attending: Interventional Radiology | Admitting: Interventional Radiology

## 2019-05-05 ENCOUNTER — Other Ambulatory Visit: Payer: Self-pay

## 2019-05-05 DIAGNOSIS — I671 Cerebral aneurysm, nonruptured: Secondary | ICD-10-CM | POA: Insufficient documentation

## 2019-05-05 DIAGNOSIS — I729 Aneurysm of unspecified site: Secondary | ICD-10-CM | POA: Diagnosis present

## 2019-05-10 ENCOUNTER — Telehealth (HOSPITAL_COMMUNITY): Payer: Self-pay

## 2019-05-10 NOTE — Telephone Encounter (Signed)
Called regarding recent mri, no answer, no vm. AW

## 2019-05-12 ENCOUNTER — Telehealth (HOSPITAL_COMMUNITY): Payer: Self-pay

## 2019-05-12 NOTE — Telephone Encounter (Signed)
Called regarding recent mri, no answer, no vm. AW

## 2019-05-13 ENCOUNTER — Telehealth (HOSPITAL_COMMUNITY): Payer: Self-pay

## 2019-05-13 NOTE — Telephone Encounter (Signed)
Pt agreed to f/u in 1 year with mri/mra. AW  

## 2019-05-20 DIAGNOSIS — M79671 Pain in right foot: Secondary | ICD-10-CM | POA: Diagnosis not present

## 2019-05-20 DIAGNOSIS — M79674 Pain in right toe(s): Secondary | ICD-10-CM | POA: Diagnosis not present

## 2019-05-20 DIAGNOSIS — S92411A Displaced fracture of proximal phalanx of right great toe, initial encounter for closed fracture: Secondary | ICD-10-CM | POA: Diagnosis not present

## 2019-05-26 DIAGNOSIS — S1181XA Laceration without foreign body of other specified part of neck, initial encounter: Secondary | ICD-10-CM | POA: Diagnosis not present

## 2019-05-27 DIAGNOSIS — M79672 Pain in left foot: Secondary | ICD-10-CM | POA: Diagnosis not present

## 2019-05-27 DIAGNOSIS — M79674 Pain in right toe(s): Secondary | ICD-10-CM | POA: Diagnosis not present

## 2019-05-27 DIAGNOSIS — S92325A Nondisplaced fracture of second metatarsal bone, left foot, initial encounter for closed fracture: Secondary | ICD-10-CM | POA: Diagnosis not present

## 2019-05-28 DIAGNOSIS — D649 Anemia, unspecified: Secondary | ICD-10-CM | POA: Diagnosis not present

## 2019-06-08 ENCOUNTER — Telehealth: Payer: Self-pay | Admitting: Neurology

## 2019-06-08 MED ORDER — CARBAMAZEPINE 200 MG PO TABS: ORAL_TABLET | ORAL | 3 refills | Status: DC

## 2019-06-08 NOTE — Telephone Encounter (Signed)
I called the patient.  The patient has had increasing pain over the last month or so, she was requiring more Ultram, running up early.  She is still having sharp jabs of pain in the back, I will try low-dose carbamazepine to see if this is beneficial, if it helps we will stick with it if not we will stop the medication.  She is already on gabapentin, she takes low-dose amitriptyline at night.  This medication could also be increased in the future.

## 2019-06-08 NOTE — Addendum Note (Signed)
Addended by: Kathrynn Ducking on: 06/08/2019 04:52 PM   Modules accepted: Orders

## 2019-06-08 NOTE — Telephone Encounter (Signed)
Phone rep checked office voicemail; at 2:57p.m. pt left a message stating the traMADol (ULTRAM) 50 MG tablet is not working.  Please call

## 2019-06-09 DIAGNOSIS — M9903 Segmental and somatic dysfunction of lumbar region: Secondary | ICD-10-CM | POA: Diagnosis not present

## 2019-06-09 DIAGNOSIS — M9902 Segmental and somatic dysfunction of thoracic region: Secondary | ICD-10-CM | POA: Diagnosis not present

## 2019-06-09 DIAGNOSIS — S335XXA Sprain of ligaments of lumbar spine, initial encounter: Secondary | ICD-10-CM | POA: Diagnosis not present

## 2019-06-09 DIAGNOSIS — M47812 Spondylosis without myelopathy or radiculopathy, cervical region: Secondary | ICD-10-CM | POA: Diagnosis not present

## 2019-06-09 DIAGNOSIS — M9901 Segmental and somatic dysfunction of cervical region: Secondary | ICD-10-CM | POA: Diagnosis not present

## 2019-06-10 DIAGNOSIS — S92325A Nondisplaced fracture of second metatarsal bone, left foot, initial encounter for closed fracture: Secondary | ICD-10-CM | POA: Diagnosis not present

## 2019-06-10 DIAGNOSIS — M79675 Pain in left toe(s): Secondary | ICD-10-CM | POA: Diagnosis not present

## 2019-06-10 DIAGNOSIS — M79672 Pain in left foot: Secondary | ICD-10-CM | POA: Diagnosis not present

## 2019-06-11 DIAGNOSIS — M47812 Spondylosis without myelopathy or radiculopathy, cervical region: Secondary | ICD-10-CM | POA: Diagnosis not present

## 2019-06-11 DIAGNOSIS — S335XXA Sprain of ligaments of lumbar spine, initial encounter: Secondary | ICD-10-CM | POA: Diagnosis not present

## 2019-06-11 DIAGNOSIS — M9901 Segmental and somatic dysfunction of cervical region: Secondary | ICD-10-CM | POA: Diagnosis not present

## 2019-06-11 DIAGNOSIS — M9903 Segmental and somatic dysfunction of lumbar region: Secondary | ICD-10-CM | POA: Diagnosis not present

## 2019-06-11 DIAGNOSIS — M9902 Segmental and somatic dysfunction of thoracic region: Secondary | ICD-10-CM | POA: Diagnosis not present

## 2019-06-14 DIAGNOSIS — M9902 Segmental and somatic dysfunction of thoracic region: Secondary | ICD-10-CM | POA: Diagnosis not present

## 2019-06-14 DIAGNOSIS — M47812 Spondylosis without myelopathy or radiculopathy, cervical region: Secondary | ICD-10-CM | POA: Diagnosis not present

## 2019-06-14 DIAGNOSIS — M9903 Segmental and somatic dysfunction of lumbar region: Secondary | ICD-10-CM | POA: Diagnosis not present

## 2019-06-14 DIAGNOSIS — M9901 Segmental and somatic dysfunction of cervical region: Secondary | ICD-10-CM | POA: Diagnosis not present

## 2019-06-14 DIAGNOSIS — S335XXA Sprain of ligaments of lumbar spine, initial encounter: Secondary | ICD-10-CM | POA: Diagnosis not present

## 2019-06-15 DIAGNOSIS — S62311A Displaced fracture of base of second metacarpal bone. left hand, initial encounter for closed fracture: Secondary | ICD-10-CM | POA: Diagnosis not present

## 2019-06-15 DIAGNOSIS — M7989 Other specified soft tissue disorders: Secondary | ICD-10-CM | POA: Diagnosis not present

## 2019-06-15 DIAGNOSIS — S93322A Subluxation of tarsometatarsal joint of left foot, initial encounter: Secondary | ICD-10-CM | POA: Diagnosis not present

## 2019-06-15 DIAGNOSIS — S92322A Displaced fracture of second metatarsal bone, left foot, initial encounter for closed fracture: Secondary | ICD-10-CM | POA: Diagnosis not present

## 2019-06-15 DIAGNOSIS — R2242 Localized swelling, mass and lump, left lower limb: Secondary | ICD-10-CM | POA: Diagnosis not present

## 2019-06-15 DIAGNOSIS — M79672 Pain in left foot: Secondary | ICD-10-CM | POA: Diagnosis not present

## 2019-06-16 DIAGNOSIS — M9901 Segmental and somatic dysfunction of cervical region: Secondary | ICD-10-CM | POA: Diagnosis not present

## 2019-06-16 DIAGNOSIS — M47812 Spondylosis without myelopathy or radiculopathy, cervical region: Secondary | ICD-10-CM | POA: Diagnosis not present

## 2019-06-16 DIAGNOSIS — M9902 Segmental and somatic dysfunction of thoracic region: Secondary | ICD-10-CM | POA: Diagnosis not present

## 2019-06-16 DIAGNOSIS — S335XXA Sprain of ligaments of lumbar spine, initial encounter: Secondary | ICD-10-CM | POA: Diagnosis not present

## 2019-06-16 DIAGNOSIS — M9903 Segmental and somatic dysfunction of lumbar region: Secondary | ICD-10-CM | POA: Diagnosis not present

## 2019-06-17 DIAGNOSIS — R0989 Other specified symptoms and signs involving the circulatory and respiratory systems: Secondary | ICD-10-CM | POA: Diagnosis not present

## 2019-06-17 DIAGNOSIS — M79675 Pain in left toe(s): Secondary | ICD-10-CM | POA: Diagnosis not present

## 2019-06-17 DIAGNOSIS — S93325A Dislocation of tarsometatarsal joint of left foot, initial encounter: Secondary | ICD-10-CM | POA: Diagnosis not present

## 2019-06-17 DIAGNOSIS — S92235A Nondisplaced fracture of intermediate cuneiform of left foot, initial encounter for closed fracture: Secondary | ICD-10-CM | POA: Diagnosis not present

## 2019-06-17 DIAGNOSIS — T148XXA Other injury of unspecified body region, initial encounter: Secondary | ICD-10-CM | POA: Diagnosis not present

## 2019-06-17 DIAGNOSIS — M79672 Pain in left foot: Secondary | ICD-10-CM | POA: Diagnosis not present

## 2019-06-17 DIAGNOSIS — S92322A Displaced fracture of second metatarsal bone, left foot, initial encounter for closed fracture: Secondary | ICD-10-CM | POA: Diagnosis not present

## 2019-06-18 DIAGNOSIS — M9901 Segmental and somatic dysfunction of cervical region: Secondary | ICD-10-CM | POA: Diagnosis not present

## 2019-06-18 DIAGNOSIS — M9902 Segmental and somatic dysfunction of thoracic region: Secondary | ICD-10-CM | POA: Diagnosis not present

## 2019-06-18 DIAGNOSIS — M9903 Segmental and somatic dysfunction of lumbar region: Secondary | ICD-10-CM | POA: Diagnosis not present

## 2019-06-18 DIAGNOSIS — M47812 Spondylosis without myelopathy or radiculopathy, cervical region: Secondary | ICD-10-CM | POA: Diagnosis not present

## 2019-06-18 DIAGNOSIS — S335XXA Sprain of ligaments of lumbar spine, initial encounter: Secondary | ICD-10-CM | POA: Diagnosis not present

## 2019-06-21 DIAGNOSIS — M47812 Spondylosis without myelopathy or radiculopathy, cervical region: Secondary | ICD-10-CM | POA: Diagnosis not present

## 2019-06-21 DIAGNOSIS — M9901 Segmental and somatic dysfunction of cervical region: Secondary | ICD-10-CM | POA: Diagnosis not present

## 2019-06-21 DIAGNOSIS — M9903 Segmental and somatic dysfunction of lumbar region: Secondary | ICD-10-CM | POA: Diagnosis not present

## 2019-06-21 DIAGNOSIS — M9902 Segmental and somatic dysfunction of thoracic region: Secondary | ICD-10-CM | POA: Diagnosis not present

## 2019-06-21 DIAGNOSIS — S335XXA Sprain of ligaments of lumbar spine, initial encounter: Secondary | ICD-10-CM | POA: Diagnosis not present

## 2019-06-22 DIAGNOSIS — R0989 Other specified symptoms and signs involving the circulatory and respiratory systems: Secondary | ICD-10-CM | POA: Diagnosis not present

## 2019-06-24 DIAGNOSIS — M9903 Segmental and somatic dysfunction of lumbar region: Secondary | ICD-10-CM | POA: Diagnosis not present

## 2019-06-24 DIAGNOSIS — M9901 Segmental and somatic dysfunction of cervical region: Secondary | ICD-10-CM | POA: Diagnosis not present

## 2019-06-24 DIAGNOSIS — M47812 Spondylosis without myelopathy or radiculopathy, cervical region: Secondary | ICD-10-CM | POA: Diagnosis not present

## 2019-06-24 DIAGNOSIS — M9902 Segmental and somatic dysfunction of thoracic region: Secondary | ICD-10-CM | POA: Diagnosis not present

## 2019-06-24 DIAGNOSIS — S335XXA Sprain of ligaments of lumbar spine, initial encounter: Secondary | ICD-10-CM | POA: Diagnosis not present

## 2019-06-25 DIAGNOSIS — S0101XA Laceration without foreign body of scalp, initial encounter: Secondary | ICD-10-CM | POA: Diagnosis not present

## 2019-06-25 DIAGNOSIS — Z01818 Encounter for other preprocedural examination: Secondary | ICD-10-CM | POA: Diagnosis not present

## 2019-06-25 DIAGNOSIS — W01198A Fall on same level from slipping, tripping and stumbling with subsequent striking against other object, initial encounter: Secondary | ICD-10-CM | POA: Diagnosis not present

## 2019-06-25 DIAGNOSIS — Y999 Unspecified external cause status: Secondary | ICD-10-CM | POA: Diagnosis not present

## 2019-06-25 DIAGNOSIS — T148XXA Other injury of unspecified body region, initial encounter: Secondary | ICD-10-CM | POA: Diagnosis not present

## 2019-06-25 DIAGNOSIS — Z5320 Procedure and treatment not carried out because of patient's decision for unspecified reasons: Secondary | ICD-10-CM | POA: Diagnosis not present

## 2019-06-25 DIAGNOSIS — S93325A Dislocation of tarsometatarsal joint of left foot, initial encounter: Secondary | ICD-10-CM | POA: Diagnosis not present

## 2019-06-25 DIAGNOSIS — S92322A Displaced fracture of second metatarsal bone, left foot, initial encounter for closed fracture: Secondary | ICD-10-CM | POA: Diagnosis not present

## 2019-06-26 DIAGNOSIS — J449 Chronic obstructive pulmonary disease, unspecified: Secondary | ICD-10-CM | POA: Diagnosis not present

## 2019-06-26 DIAGNOSIS — W1839XA Other fall on same level, initial encounter: Secondary | ICD-10-CM | POA: Diagnosis not present

## 2019-06-26 DIAGNOSIS — Z882 Allergy status to sulfonamides status: Secondary | ICD-10-CM | POA: Diagnosis not present

## 2019-06-26 DIAGNOSIS — Z79899 Other long term (current) drug therapy: Secondary | ICD-10-CM | POA: Diagnosis not present

## 2019-06-26 DIAGNOSIS — M797 Fibromyalgia: Secondary | ICD-10-CM | POA: Diagnosis not present

## 2019-06-26 DIAGNOSIS — Z23 Encounter for immunization: Secondary | ICD-10-CM | POA: Diagnosis not present

## 2019-06-26 DIAGNOSIS — S01311A Laceration without foreign body of right ear, initial encounter: Secondary | ICD-10-CM | POA: Diagnosis not present

## 2019-06-26 DIAGNOSIS — Z7982 Long term (current) use of aspirin: Secondary | ICD-10-CM | POA: Diagnosis not present

## 2019-06-26 DIAGNOSIS — S0990XA Unspecified injury of head, initial encounter: Secondary | ICD-10-CM | POA: Diagnosis not present

## 2019-06-26 DIAGNOSIS — Z7902 Long term (current) use of antithrombotics/antiplatelets: Secondary | ICD-10-CM | POA: Diagnosis not present

## 2019-06-29 DIAGNOSIS — Z87891 Personal history of nicotine dependence: Secondary | ICD-10-CM | POA: Diagnosis not present

## 2019-06-29 DIAGNOSIS — J449 Chronic obstructive pulmonary disease, unspecified: Secondary | ICD-10-CM | POA: Diagnosis not present

## 2019-06-29 DIAGNOSIS — M12572 Traumatic arthropathy, left ankle and foot: Secondary | ICD-10-CM | POA: Diagnosis not present

## 2019-06-29 DIAGNOSIS — S93322A Subluxation of tarsometatarsal joint of left foot, initial encounter: Secondary | ICD-10-CM | POA: Diagnosis not present

## 2019-06-29 DIAGNOSIS — K3184 Gastroparesis: Secondary | ICD-10-CM | POA: Diagnosis not present

## 2019-06-29 DIAGNOSIS — Z888 Allergy status to other drugs, medicaments and biological substances status: Secondary | ICD-10-CM | POA: Diagnosis not present

## 2019-06-29 DIAGNOSIS — D649 Anemia, unspecified: Secondary | ICD-10-CM | POA: Diagnosis not present

## 2019-06-29 DIAGNOSIS — Z8673 Personal history of transient ischemic attack (TIA), and cerebral infarction without residual deficits: Secondary | ICD-10-CM | POA: Diagnosis not present

## 2019-06-29 DIAGNOSIS — Z7902 Long term (current) use of antithrombotics/antiplatelets: Secondary | ICD-10-CM | POA: Diagnosis not present

## 2019-06-29 DIAGNOSIS — I10 Essential (primary) hypertension: Secondary | ICD-10-CM | POA: Diagnosis not present

## 2019-06-29 DIAGNOSIS — Z7982 Long term (current) use of aspirin: Secondary | ICD-10-CM | POA: Diagnosis not present

## 2019-06-29 DIAGNOSIS — S92322A Displaced fracture of second metatarsal bone, left foot, initial encounter for closed fracture: Secondary | ICD-10-CM | POA: Diagnosis not present

## 2019-06-29 DIAGNOSIS — S92332A Displaced fracture of third metatarsal bone, left foot, initial encounter for closed fracture: Secondary | ICD-10-CM | POA: Diagnosis not present

## 2019-06-29 DIAGNOSIS — Z79899 Other long term (current) drug therapy: Secondary | ICD-10-CM | POA: Diagnosis not present

## 2019-06-29 DIAGNOSIS — Z882 Allergy status to sulfonamides status: Secondary | ICD-10-CM | POA: Diagnosis not present

## 2019-06-29 DIAGNOSIS — S93325A Dislocation of tarsometatarsal joint of left foot, initial encounter: Secondary | ICD-10-CM | POA: Diagnosis not present

## 2019-06-29 DIAGNOSIS — M797 Fibromyalgia: Secondary | ICD-10-CM | POA: Diagnosis not present

## 2019-07-06 DIAGNOSIS — S93325A Dislocation of tarsometatarsal joint of left foot, initial encounter: Secondary | ICD-10-CM | POA: Diagnosis not present

## 2019-07-06 DIAGNOSIS — Z9889 Other specified postprocedural states: Secondary | ICD-10-CM | POA: Diagnosis not present

## 2019-07-23 DIAGNOSIS — Z9889 Other specified postprocedural states: Secondary | ICD-10-CM | POA: Diagnosis not present

## 2019-07-23 DIAGNOSIS — Z4789 Encounter for other orthopedic aftercare: Secondary | ICD-10-CM | POA: Diagnosis not present

## 2019-07-23 DIAGNOSIS — Z4689 Encounter for fitting and adjustment of other specified devices: Secondary | ICD-10-CM | POA: Diagnosis not present

## 2019-07-28 ENCOUNTER — Other Ambulatory Visit: Payer: Self-pay

## 2019-07-28 ENCOUNTER — Encounter: Payer: Self-pay | Admitting: Neurology

## 2019-07-28 ENCOUNTER — Ambulatory Visit: Payer: Medicare Other | Admitting: Neurology

## 2019-07-28 VITALS — BP 144/82 | HR 60 | Temp 97.4°F | Wt 117.0 lb

## 2019-07-28 DIAGNOSIS — G40909 Epilepsy, unspecified, not intractable, without status epilepticus: Secondary | ICD-10-CM | POA: Diagnosis not present

## 2019-07-28 DIAGNOSIS — R269 Unspecified abnormalities of gait and mobility: Secondary | ICD-10-CM | POA: Diagnosis not present

## 2019-07-28 DIAGNOSIS — R413 Other amnesia: Secondary | ICD-10-CM

## 2019-07-28 DIAGNOSIS — M797 Fibromyalgia: Secondary | ICD-10-CM

## 2019-07-28 MED ORDER — LEVETIRACETAM 250 MG PO TABS: 250.0000 mg | ORAL_TABLET | Freq: Two times a day (BID) | ORAL | 3 refills | Status: DC

## 2019-07-28 NOTE — Patient Instructions (Signed)
We will try Keppra for the back pain, call for any dose adjustments.

## 2019-07-28 NOTE — Addendum Note (Signed)
Addended by: Kathrynn Ducking on: 07/28/2019 10:48 AM   Modules accepted: Orders

## 2019-07-28 NOTE — Progress Notes (Signed)
Reason for visit: Midthoracic pain, seizures  Brandy Gutierrez is an 58 y.o. female  History of present illness:  Brandy Gutierrez is a 58 year old right-handed white female with a history of seizures, she is being treated with Lamictal in low-dose.  The patient is on gabapentin taking 600 mg 4 times daily for severe midthoracic pain, MRI studies have been done at [facility] involving the thoracic and lumbar spine.  The patient has recently had a fall associated with a blackout, the patient had rolling of the eyes and then collapsed, it did not appear to be a seizure type event.  The patient did hit her head and fractured her left foot.  This occurred on 25 June 2019, the patient has had surgery on the left foot and now it is in a cast, she cannot bear weight currently.  The patient continues to have midthoracic pain.  She could not tolerate carbamazepine as this resulted in jitteriness.  She is not sleeping well at night because of the pain.  She does have headaches on the carbamazepine, she stopped the carbamazepine yesterday.  Past Medical History:  Diagnosis Date  . Abnormality of gait 09/10/2016  . Anemia   . Anterior cerebral aneurysm 08/06/2017  . Asthma due to seasonal allergies   . Cervical cancer (HCC)    dx at age 60  . Chronic insomnia   . COPD (chronic obstructive pulmonary disease) ([place])   . Fibromyalgia   . GERD (gastroesophageal reflux disease)   . Headache    "everyday" - for years   . History of kidney stones    passed  . Hypertension   . Memory disorder 04/08/2014  . Pneumonia   . Seizure ([place])   . Somnambulism   . Stomach ulcer   . TIA (transient ischemic attack)    x 6  . Tremor   . Vitamin B12 deficiency     Past Surgical History:  Procedure Laterality Date  . APPENDECTOMY     taken out with hysterectomy  . CHOLECYSTECTOMY    . COLONOSCOPY    . IR ANGIO INTRA EXTRACRAN SEL COM CAROTID INNOMINATE UNI L MOD SED  07/09/2018  . IR ANGIO INTRA  EXTRACRAN SEL INTERNAL CAROTID UNI R MOD SED  07/09/2018  . IR ANGIO VERTEBRAL SEL VERTEBRAL BILAT MOD SED  07/09/2018  . IR ANGIOGRAM FOLLOW UP STUDY  07/09/2018  . IR ANGIOGRAM FOLLOW UP STUDY  07/09/2018  . IR ANGIOGRAM FOLLOW UP STUDY  07/09/2018  . IR ANGIOGRAM FOLLOW UP STUDY  07/09/2018  . IR ANGIOGRAM FOLLOW UP STUDY  07/09/2018  . IR ANGIOGRAM FOLLOW UP STUDY  07/09/2018  . IR NEURO EACH ADD'L AFTER BASIC UNI RIGHT (MS)  07/09/2018  . IR RADIOLOGIST EVAL & MGMT  08/08/2017  . IR TRANSCATH/EMBOLIZ  07/09/2018  . RADIOLOGY WITH ANESTHESIA N/A 07/09/2018   Procedure: RADIOLOGY WITH ANESTHESIA  EMBOLIZATION;  Surgeon: Luanne Bras, MD;  Location: [facility];  Service: Radiology;  Laterality: N/A;  . SALPINGOOPHORECTOMY     age 22 for cervical cancer  . TOTAL ABDOMINAL HYSTERECTOMY     age 62 for cervical cancer    Family History  Problem Relation Age of Onset  . Cancer Mother 48       breast  . Cancer Maternal Aunt 28       breast  . Cancer Maternal Uncle 65       colon  . Cancer Maternal Grandmother 33  breast  . Stroke Maternal Grandmother   . Cancer Maternal Aunt 40       unknown type of cancer  . Cancer Maternal Uncle 68       GI cancer (? pancreatic)  . Cancer Maternal Aunt 35       breast  . Cancer Father   . Hypertension Sister   . Hypertension Brother   . Cancer Maternal Grandfather 65       throat - smoker  . Cancer Cousin 22       breast  . Cancer Cousin 23       breast    Social history:  reports that she quit smoking about 5 years ago. Her smoking use included cigarettes. She has a 28.00 pack-year smoking history. She has never used smokeless tobacco. She reports previous alcohol use. She reports that she does not use drugs.    Allergies  Allergen Reactions  . Sulfa Antibiotics Hives and Shortness Of Breath  . Metronidazole Other (See Comments)    Unknown reaction type  . Cymbalta [Duloxetine Hcl] Other (See Comments)    Confusion     Medications:  Prior to Admission medications   Medication Sig Start Date End Date Taking? Authorizing Provider  albuterol (PROAIR HFA) 108 (90 Base) MCG/ACT inhaler Inhale 2 puffs into the lungs every 6 (six) hours as needed for wheezing or shortness of breath.   Yes [provider]  amitriptyline (ELAVIL) 10 MG tablet Take 10 mg by mouth at bedtime. 05/19/18  Yes [provider]  amLODipine (NORVASC) 2.5 MG tablet Take 2.5 mg by mouth daily.  04/28/18  Yes [provider]  aspirin EC 81 MG tablet Take 81 mg by mouth daily.   Yes [provider]  Biotin 10000 MCG TABS Take 10,000 mcg by mouth 2 (two) times daily.   Yes [provider]  Cholecalciferol (VITAMIN D-3) 1000 units CAPS Take 1,000 Units by mouth daily.    Yes [provider]  clopidogrel (PLAVIX) 75 MG tablet Take 75 mg by mouth daily. 06/04/18  Yes [provider]  colestipol (COLESTID) 1 g tablet Take 0.5 g by mouth daily.  05/06/18  Yes [provider]  cyanocobalamin (,VITAMIN B-12,) 1000 MCG/ML injection Inject 1,000 mcg into the muscle every 30 (thirty) days.   Yes [provider]  fexofenadine (ALLEGRA ALLERGY) 180 MG tablet Take 180 mg by mouth daily.   Yes [provider]  folic acid (FOLVITE) 1 MG tablet Take 1 mg by mouth daily. 03/31/14  Yes [provider]  gabapentin (NEURONTIN) 600 MG tablet Take 600 mg by mouth 4 (four) times daily.   Yes [provider]  ipratropium-albuterol (DUONEB) 0.5-2.5 (3) MG/3ML SOLN Take 3 mLs by nebulization every 6 (six) hours as needed (for wheezing/shortness of breath.).   Yes [provider]  lamoTRIgine (LAMICTAL) 25 MG tablet Take 50 mg by mouth 2 (two) times daily.    Yes [provider]  midodrine (PROAMATINE) 2.5 MG tablet Take by mouth. 06/23/19  Yes [provider]  mirtazapine (REMERON) 15 MG tablet Take 15 mg by mouth at bedtime.   Yes [provider]  Multiple Vitamin (MULTIVITAMIN WITH MINERALS) TABS tablet Take 1 tablet by mouth daily at 8 pm.   Yes [provider]  propranolol (INDERAL) 20 MG tablet Take 20 mg by mouth 2 (two) times daily. 06/03/18  Yes [provider]  rosuvastatin (CRESTOR) 20 MG tablet Take 20 mg by  mouth at bedtime. 05/01/18  Yes [provider]  thiamine (VITAMIN B-1) 100 MG tablet Take 100 mg by mouth daily.   Yes [provider]  traMADol (ULTRAM) 50 MG tablet TAKE 1 TABLET BY MOUTH EVERY 6 HOURS AS NEEDED 05/03/19  Yes Kathrynn Ducking, MD    ROS:  Out of a complete 14 system review of symptoms, the patient complains only of the following symptoms, and all other reviewed systems are negative.  Midthoracic pain Syncope Walking difficulty  Blood pressure (!) 144/82, pulse 60, temperature (!) 97.4 F (36.3 C), temperature source Temporal, weight 117 lb (53.1 kg).  Physical Exam  General: The patient is alert and cooperative at the time of the examination.  Skin: No significant peripheral edema is noted.   Neurologic Exam  Mental status: The patient is alert and oriented x 3 at the time of the examination. The patient has apparent normal recent and remote memory, with an apparently normal attention span and concentration ability.   Cranial nerves: Facial symmetry is present. Speech is normal, no aphasia or dysarthria is noted. Extraocular movements are full. Visual fields are full.  Motor: The patient has good strength in all 4 extremities.  Sensory examination: Soft touch sensation is symmetric on the face, arms, and legs.  Coordination: The patient has good finger-nose-finger and heel-to-shin bilaterally.  Gait and station: The patient could not be ambulated, she has a cast on the left foot.  Reflexes: Deep tendon reflexes are symmetric.   CT head 03/18/19:  Findings:  No acute territorial infarction, hemorrhage, or intracranial mass.   Atrophy and mild small vessel ischemic changes of the white matter.  Stable ventricular size.  Vascular: Previous stent and aneurysm coiling.  No unexpected calcifications. Skeletal: Normal, negative for fracture or focal lesion. Sinuses/orbits: No acute finding.  * CT scan images were reviewed online. I agree with the written report.    Assessment/Plan:  1.  History of seizures  2.  Recent syncopal episode and fall  3.  History of cerebral aneurysm, stable by MRA  4.  Midthoracic pain  5.  Gait disorder, possible peripheral neuropathy  The patient will be given a trial on low-dose Keppra, they will call for any dose adjustments, this will be given for the pain issues.  The patient will follow-up here in about 4 or 5 months.  She will remain on the gabapentin.    Addendum: I have received the reports of the MRI of the thoracic spine done on 25 January 2019.  The study was surprisingly unremarkable, no evidence of disc protrusions or foraminal stenosis at any level.  MRI of the lumbar spine was done as well showing a minimal broad-based disc at the L3-4 level with mild bilateral facet arthropathy.  At the L4-5 and L5-S1 level there is also a mild broad-based disc bulge and some mild facet joint arthritis at the L4-5 level, no evidence of nerve root impingement is seen at any level.     Jill Alexanders MD 07/28/2019 10:19 AM  [facility] [address] [place], [place] [address]  Phone [phone] Fax [phone]

## 2019-08-11 DIAGNOSIS — Z9889 Other specified postprocedural states: Secondary | ICD-10-CM | POA: Diagnosis not present

## 2019-08-11 DIAGNOSIS — Z4789 Encounter for other orthopedic aftercare: Secondary | ICD-10-CM | POA: Diagnosis not present

## 2019-08-26 DIAGNOSIS — M25372 Other instability, left ankle: Secondary | ICD-10-CM | POA: Diagnosis not present

## 2019-08-26 DIAGNOSIS — Z9889 Other specified postprocedural states: Secondary | ICD-10-CM | POA: Diagnosis not present

## 2019-09-13 DIAGNOSIS — E785 Hyperlipidemia, unspecified: Secondary | ICD-10-CM | POA: Diagnosis not present

## 2019-09-13 DIAGNOSIS — J449 Chronic obstructive pulmonary disease, unspecified: Secondary | ICD-10-CM | POA: Diagnosis not present

## 2019-09-13 DIAGNOSIS — M797 Fibromyalgia: Secondary | ICD-10-CM | POA: Diagnosis not present

## 2019-09-13 DIAGNOSIS — Z0000 Encounter for general adult medical examination without abnormal findings: Secondary | ICD-10-CM | POA: Diagnosis not present

## 2019-09-13 DIAGNOSIS — Z1389 Encounter for screening for other disorder: Secondary | ICD-10-CM | POA: Diagnosis not present

## 2019-09-16 DIAGNOSIS — Z4789 Encounter for other orthopedic aftercare: Secondary | ICD-10-CM | POA: Diagnosis not present

## 2019-09-16 DIAGNOSIS — Z9889 Other specified postprocedural states: Secondary | ICD-10-CM | POA: Diagnosis not present

## 2019-09-17 DIAGNOSIS — Z0000 Encounter for general adult medical examination without abnormal findings: Secondary | ICD-10-CM | POA: Diagnosis not present

## 2019-09-17 DIAGNOSIS — Z131 Encounter for screening for diabetes mellitus: Secondary | ICD-10-CM | POA: Diagnosis not present

## 2019-10-04 DIAGNOSIS — Z4789 Encounter for other orthopedic aftercare: Secondary | ICD-10-CM | POA: Diagnosis not present

## 2019-10-04 DIAGNOSIS — Z9889 Other specified postprocedural states: Secondary | ICD-10-CM | POA: Diagnosis not present

## 2019-10-18 DIAGNOSIS — M9901 Segmental and somatic dysfunction of cervical region: Secondary | ICD-10-CM | POA: Diagnosis not present

## 2019-10-18 DIAGNOSIS — S233XXA Sprain of ligaments of thoracic spine, initial encounter: Secondary | ICD-10-CM | POA: Diagnosis not present

## 2019-10-18 DIAGNOSIS — M47812 Spondylosis without myelopathy or radiculopathy, cervical region: Secondary | ICD-10-CM | POA: Diagnosis not present

## 2019-10-18 DIAGNOSIS — M9902 Segmental and somatic dysfunction of thoracic region: Secondary | ICD-10-CM | POA: Diagnosis not present

## 2019-10-18 DIAGNOSIS — M9903 Segmental and somatic dysfunction of lumbar region: Secondary | ICD-10-CM | POA: Diagnosis not present

## 2019-10-19 DIAGNOSIS — J449 Chronic obstructive pulmonary disease, unspecified: Secondary | ICD-10-CM | POA: Diagnosis not present

## 2019-10-19 DIAGNOSIS — M5442 Lumbago with sciatica, left side: Secondary | ICD-10-CM | POA: Diagnosis not present

## 2019-10-19 DIAGNOSIS — M797 Fibromyalgia: Secondary | ICD-10-CM | POA: Diagnosis not present

## 2019-10-19 DIAGNOSIS — E785 Hyperlipidemia, unspecified: Secondary | ICD-10-CM | POA: Diagnosis not present

## 2019-10-19 DIAGNOSIS — Z0000 Encounter for general adult medical examination without abnormal findings: Secondary | ICD-10-CM | POA: Diagnosis not present

## 2019-10-20 DIAGNOSIS — M419 Scoliosis, unspecified: Secondary | ICD-10-CM | POA: Diagnosis not present

## 2019-10-20 DIAGNOSIS — I700 Atherosclerosis of aorta: Secondary | ICD-10-CM | POA: Diagnosis not present

## 2019-10-20 DIAGNOSIS — M546 Pain in thoracic spine: Secondary | ICD-10-CM | POA: Diagnosis not present

## 2019-10-20 DIAGNOSIS — M4184 Other forms of scoliosis, thoracic region: Secondary | ICD-10-CM | POA: Diagnosis not present

## 2019-10-28 DIAGNOSIS — M24675 Ankylosis, left foot: Secondary | ICD-10-CM | POA: Diagnosis not present

## 2019-10-28 DIAGNOSIS — I96 Gangrene, not elsewhere classified: Secondary | ICD-10-CM | POA: Diagnosis not present

## 2019-10-28 DIAGNOSIS — Z9889 Other specified postprocedural states: Secondary | ICD-10-CM | POA: Diagnosis not present

## 2019-11-03 DIAGNOSIS — M546 Pain in thoracic spine: Secondary | ICD-10-CM | POA: Diagnosis not present

## 2019-11-03 DIAGNOSIS — M419 Scoliosis, unspecified: Secondary | ICD-10-CM | POA: Diagnosis not present

## 2019-11-08 ENCOUNTER — Other Ambulatory Visit: Payer: Self-pay | Admitting: Neurology

## 2019-11-09 DIAGNOSIS — R197 Diarrhea, unspecified: Secondary | ICD-10-CM | POA: Diagnosis not present

## 2019-11-09 DIAGNOSIS — Z882 Allergy status to sulfonamides status: Secondary | ICD-10-CM | POA: Diagnosis not present

## 2019-11-09 DIAGNOSIS — D649 Anemia, unspecified: Secondary | ICD-10-CM | POA: Diagnosis not present

## 2019-11-09 DIAGNOSIS — Z9221 Personal history of antineoplastic chemotherapy: Secondary | ICD-10-CM | POA: Diagnosis not present

## 2019-11-09 DIAGNOSIS — Z7902 Long term (current) use of antithrombotics/antiplatelets: Secondary | ICD-10-CM | POA: Diagnosis not present

## 2019-11-09 DIAGNOSIS — I10 Essential (primary) hypertension: Secondary | ICD-10-CM | POA: Diagnosis not present

## 2019-11-09 DIAGNOSIS — E861 Hypovolemia: Secondary | ICD-10-CM | POA: Diagnosis not present

## 2019-11-09 DIAGNOSIS — M797 Fibromyalgia: Secondary | ICD-10-CM | POA: Diagnosis not present

## 2019-11-09 DIAGNOSIS — I959 Hypotension, unspecified: Secondary | ICD-10-CM | POA: Diagnosis not present

## 2019-11-09 DIAGNOSIS — Z743 Need for continuous supervision: Secondary | ICD-10-CM | POA: Diagnosis not present

## 2019-11-09 DIAGNOSIS — R0602 Shortness of breath: Secondary | ICD-10-CM | POA: Diagnosis not present

## 2019-11-09 DIAGNOSIS — Z20822 Contact with and (suspected) exposure to covid-19: Secondary | ICD-10-CM | POA: Diagnosis not present

## 2019-11-09 DIAGNOSIS — Z87891 Personal history of nicotine dependence: Secondary | ICD-10-CM | POA: Diagnosis not present

## 2019-11-09 DIAGNOSIS — R58 Hemorrhage, not elsewhere classified: Secondary | ICD-10-CM | POA: Diagnosis not present

## 2019-11-09 DIAGNOSIS — K274 Chronic or unspecified peptic ulcer, site unspecified, with hemorrhage: Secondary | ICD-10-CM | POA: Diagnosis not present

## 2019-11-09 DIAGNOSIS — G8929 Other chronic pain: Secondary | ICD-10-CM | POA: Diagnosis not present

## 2019-11-09 DIAGNOSIS — K922 Gastrointestinal hemorrhage, unspecified: Secondary | ICD-10-CM | POA: Diagnosis not present

## 2019-11-09 DIAGNOSIS — J449 Chronic obstructive pulmonary disease, unspecified: Secondary | ICD-10-CM | POA: Diagnosis not present

## 2019-11-09 DIAGNOSIS — D62 Acute posthemorrhagic anemia: Secondary | ICD-10-CM | POA: Diagnosis not present

## 2019-11-09 DIAGNOSIS — Z7982 Long term (current) use of aspirin: Secondary | ICD-10-CM | POA: Diagnosis not present

## 2019-11-10 IMAGING — CT CT HEAD W/O CM
3 of 4 series · 13 of 47 positions shown, 15 images · IV contrast (iopamidol)
Comparison: Cerebral angiogram 07/09/2018. Brain MRI 09/18/2016.

CLINICAL DATA: New onset left lower extremity weakness status post
anterior communicating aneurysm coiling.

EXAM:
CT ANGIOGRAPHY HEAD AND NECK
TECHNIQUE: Multidetector CT imaging of the head and neck was performed using
the standard protocol during bolus administration of intravenous
contrast. Multiplanar CT image reconstructions and MIPs were
obtained to evaluate the vascular anatomy. Carotid stenosis
measurements (when applicable) are obtained utilizing NASCET
criteria, using the distal internal carotid diameter as the
denominator.
CONTRAST:  100mL G24SHA-UZC IOPAMIDOL (G24SHA-UZC) INJECTION 76%

[Series 2: head wo · axial · 0.40mm/px · z∈[-93,+27]mm · 7 of 32 slices shown, 9 images]
[im 4/32  brain]
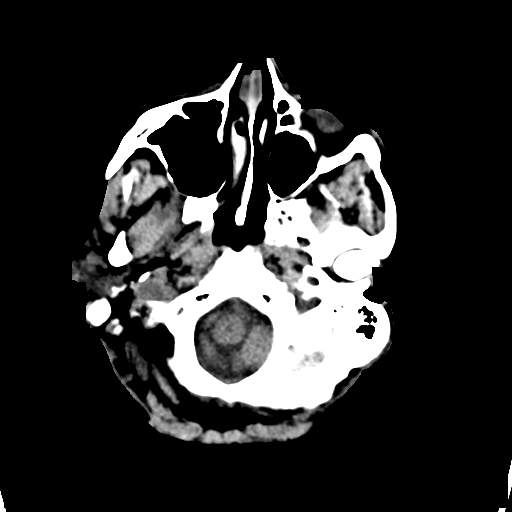
[im 4/32  bone]
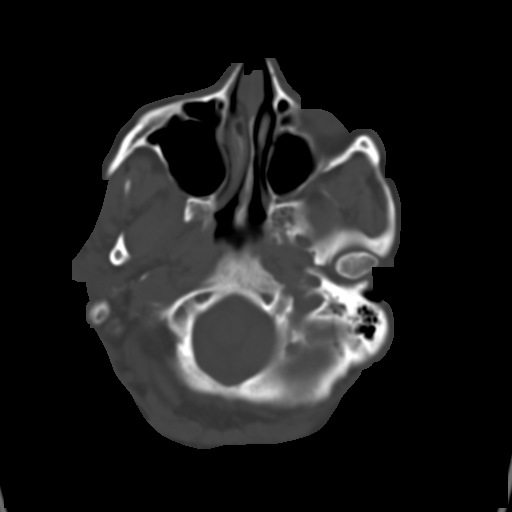
[im 8/32  brain]
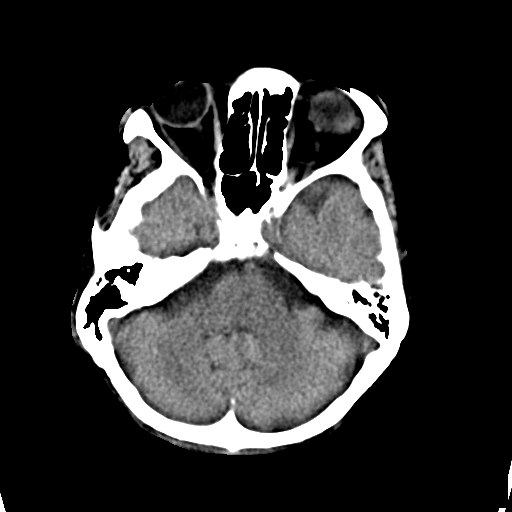
[im 12/32  brain]
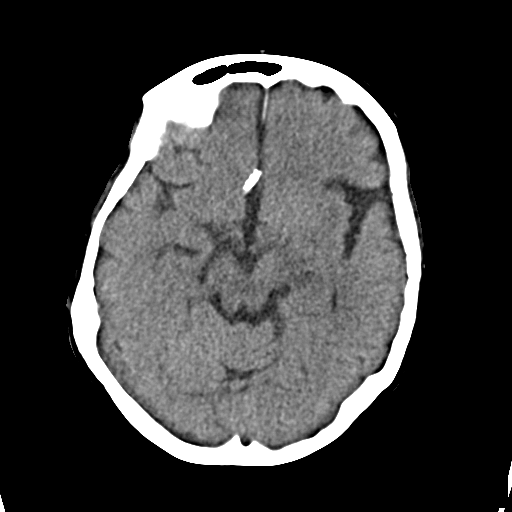
[im 16/32  brain]
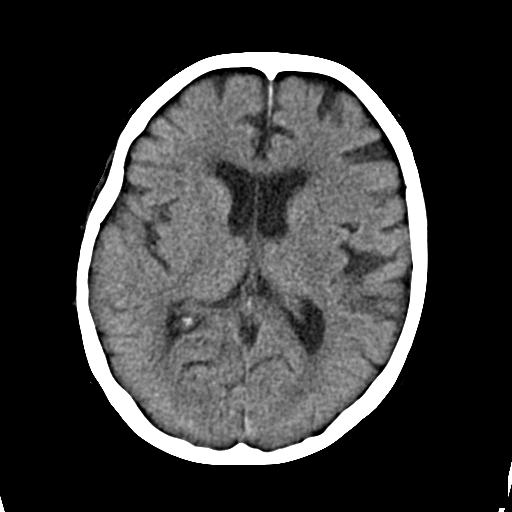
[im 20/32  brain]
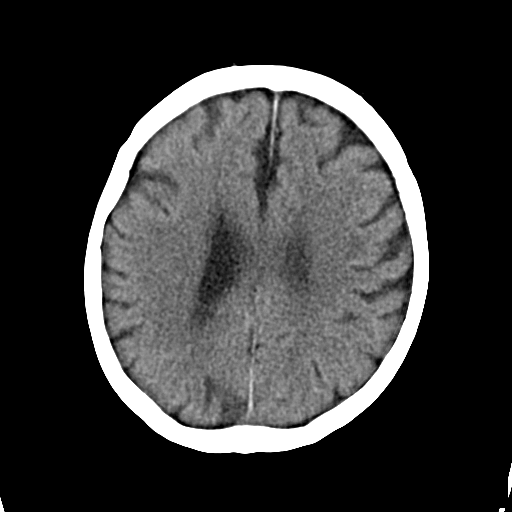
[im 20/32  bone]
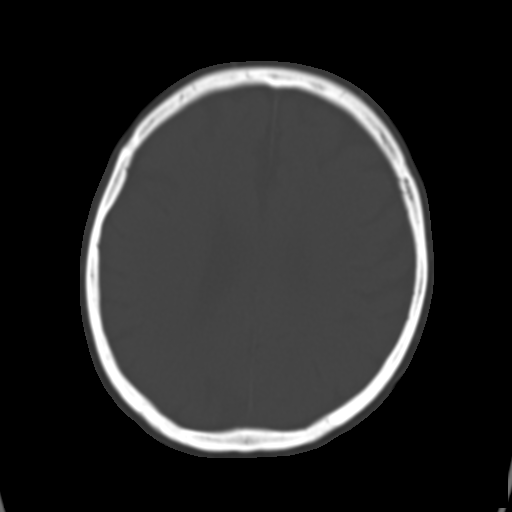
[im 24/32  brain]
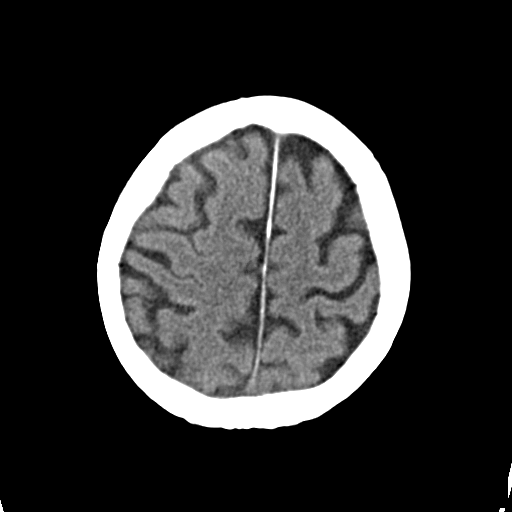
[im 28/32  brain]
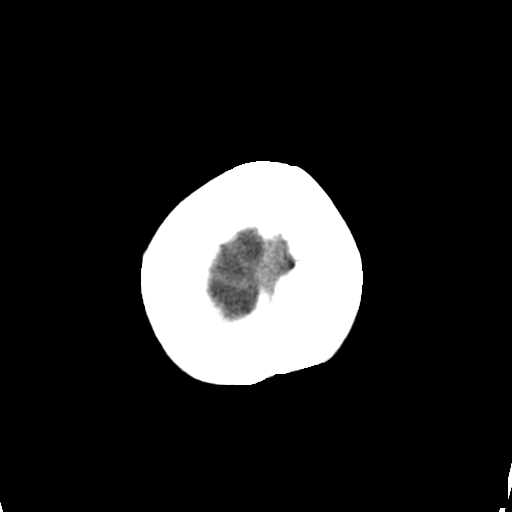

[Series 4: cor soft · coronal · 0.30mm/px · 3 of 67 slices shown]
[im 23/67  brain]
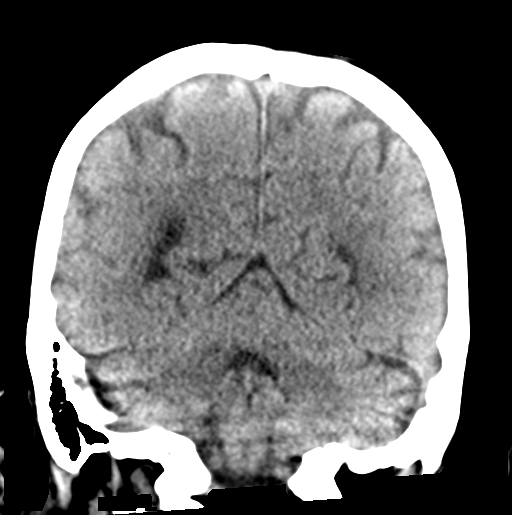
[im 30/67  brain]
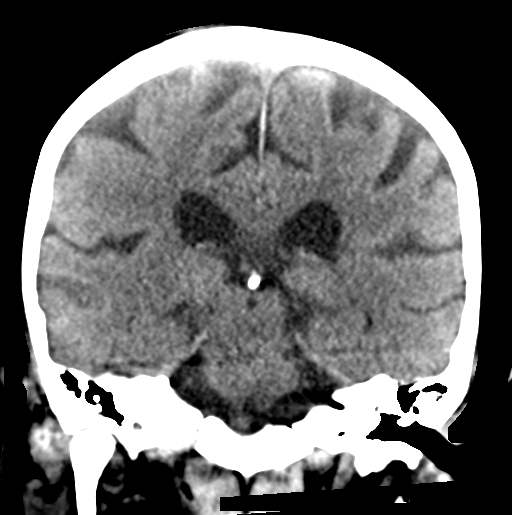
[im 37/67  brain]
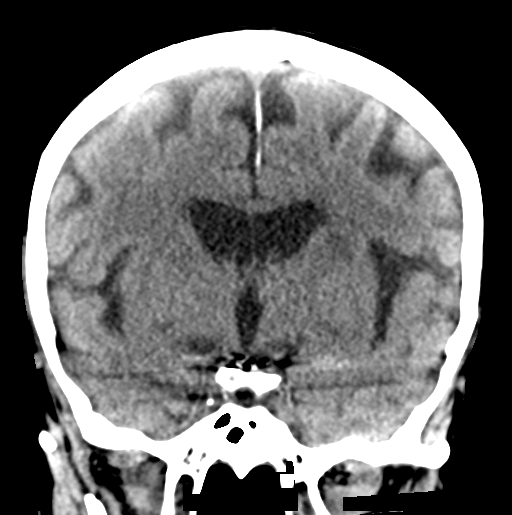

[Series 5: sag soft · sagittal · 0.30mm/px · 3 of 53 slices shown]
[im 18/53  brain]
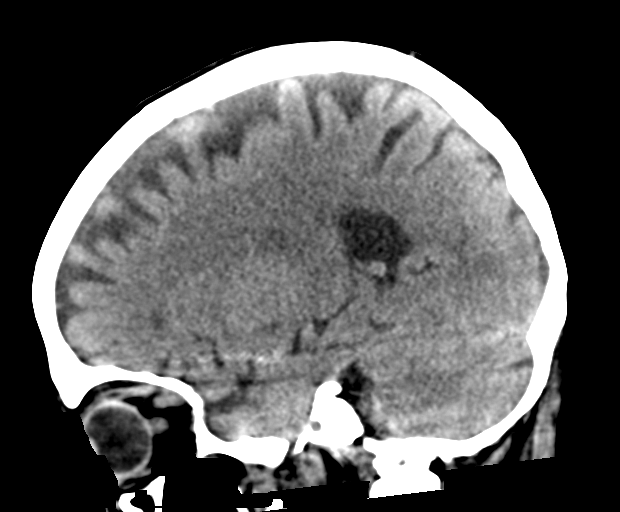
[im 27/53  brain]
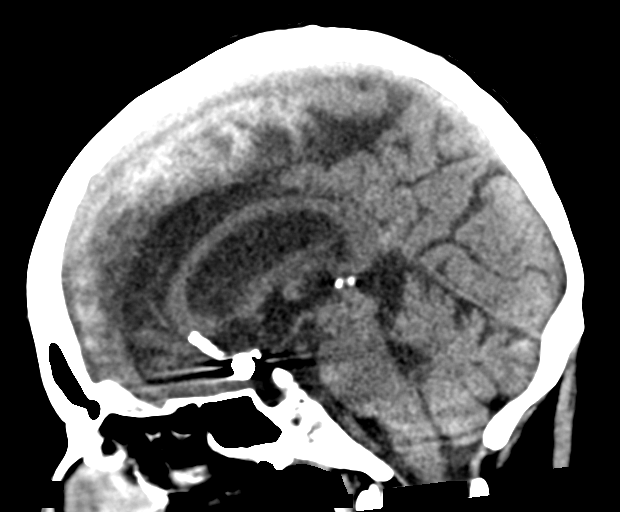
[im 35/53  brain]
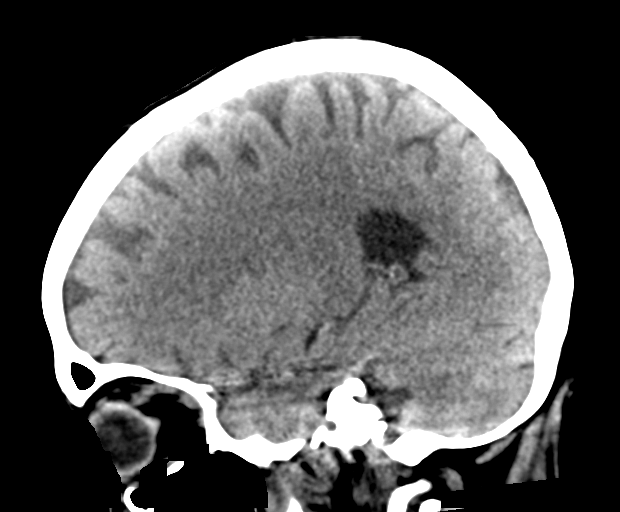

[13 of 47 positions shown; findings below may reference images not displayed]

FINDINGS: CT HEAD FINDINGS

Brain: There is no evidence of acute infarct, intracranial
hemorrhage, mass, midline shift, or extra-axial fluid collection.
There is mild cerebral atrophy. Cerebral white matter hypodensities
are nonspecific but compatible with mild chronic small vessel
ischemic disease.

Vascular: Evaluated below.

Skull: No fracture or focal osseous lesion.

Sinuses: Mild left maxillary sinus mucosal thickening. Clear mastoid
air cells.

Orbits: Unremarkable.

Review of the MIP images confirms the above findings

CTA NECK FINDINGS

Aortic arch: Standard 3 vessel aortic arch with mild atherosclerotic
plaque. Widely patent arch vessel origins.

Right carotid system: Patent with mild atherosclerotic plaque about
the carotid bifurcation without evidence of significant stenosis or
dissection.

Left carotid system: Patent with mild atherosclerotic plaque in the
mid common carotid artery and at the carotid bifurcation without
evidence of stenosis or dissection.

Vertebral arteries: Patent and codominant without evidence of
significant stenosis on the left. Mixed calcified and soft plaque at
the right vertebral artery origin results in moderate stenosis.

Skeleton: No acute osseous abnormality or suspicious osseous lesion.
Asymmetric right facet arthropathy at C3-4 with joint widening which
may indicate an underlying effusion and erosion.

Other neck: No evidence of acute abnormality or mass.

Upper chest: Moderate centrilobular emphysema.

Review of the MIP images confirms the above findings

CTA HEAD FINDINGS

Anterior circulation: The internal carotid arteries are patent from
skull base to carotid termini with minimal nonstenotic
atherosclerosis. ACAs and MCAs are patent without evidence of
proximal branch occlusion or significant proximal stenosis. There
has been recent coiling of an anterior communicating region aneurysm
with stent extending from the distal right A1 to A2 segments. The
stent appears patent. Within limitations of streak artifact from the
coil pack, no definite residual aneurysm opacification is
identified.

Posterior circulation: The intracranial vertebral arteries are
widely patent to the basilar. Patent right PICA, left AICA, and
bilateral SCA origins are identified. The basilar artery is widely
patent. Posterior communicating arteries are not clearly identified
and may be diminutive or absent. PCAs are patent without evidence of
significant stenosis. No aneurysm is identified.

Venous sinuses: Patent.

Anatomic variants: None.

Delayed phase: Small developmental venous anomaly in the right
cerebellum.

Review of the MIP images confirms the above findings
IMPRESSION: 1. No evidence of acute intracranial abnormality.
2. No large vessel occlusion.
3. Status post stent assisted coiling of anterior communicating
aneurysm. Patent stent.
4. Widely patent cervical carotid arteries and left vertebral
artery.
5. Moderate right vertebral artery origin stenosis.
6. Aortic Atherosclerosis (Z8RKP-461.1) and Emphysema (Z8RKP-Z7B.X).

## 2019-11-18 ENCOUNTER — Other Ambulatory Visit (HOSPITAL_COMMUNITY): Payer: Self-pay | Admitting: Interventional Radiology

## 2019-11-18 DIAGNOSIS — I671 Cerebral aneurysm, nonruptured: Secondary | ICD-10-CM

## 2019-11-23 DIAGNOSIS — K921 Melena: Secondary | ICD-10-CM | POA: Diagnosis not present

## 2019-11-23 DIAGNOSIS — M545 Low back pain: Secondary | ICD-10-CM | POA: Diagnosis not present

## 2019-11-25 DIAGNOSIS — M7918 Myalgia, other site: Secondary | ICD-10-CM | POA: Diagnosis not present

## 2019-11-25 DIAGNOSIS — G894 Chronic pain syndrome: Secondary | ICD-10-CM | POA: Diagnosis not present

## 2019-11-25 DIAGNOSIS — M797 Fibromyalgia: Secondary | ICD-10-CM | POA: Diagnosis not present

## 2019-11-25 DIAGNOSIS — M47814 Spondylosis without myelopathy or radiculopathy, thoracic region: Secondary | ICD-10-CM | POA: Diagnosis not present

## 2019-11-25 DIAGNOSIS — R0782 Intercostal pain: Secondary | ICD-10-CM | POA: Diagnosis not present

## 2019-11-29 DIAGNOSIS — R600 Localized edema: Secondary | ICD-10-CM | POA: Diagnosis not present

## 2019-11-29 DIAGNOSIS — M24675 Ankylosis, left foot: Secondary | ICD-10-CM | POA: Diagnosis not present

## 2019-11-29 DIAGNOSIS — M24573 Contracture, unspecified ankle: Secondary | ICD-10-CM | POA: Diagnosis not present

## 2019-11-30 DIAGNOSIS — M24675 Ankylosis, left foot: Secondary | ICD-10-CM | POA: Diagnosis not present

## 2019-11-30 DIAGNOSIS — R269 Unspecified abnormalities of gait and mobility: Secondary | ICD-10-CM | POA: Diagnosis not present

## 2019-11-30 DIAGNOSIS — M6281 Muscle weakness (generalized): Secondary | ICD-10-CM | POA: Diagnosis not present

## 2019-12-01 ENCOUNTER — Other Ambulatory Visit: Payer: Self-pay

## 2019-12-01 ENCOUNTER — Encounter: Payer: Self-pay | Admitting: Neurology

## 2019-12-01 ENCOUNTER — Ambulatory Visit: Payer: Medicare Other | Admitting: Neurology

## 2019-12-01 VITALS — BP 129/83 | HR 78 | Temp 97.4°F

## 2019-12-01 DIAGNOSIS — G40909 Epilepsy, unspecified, not intractable, without status epilepticus: Secondary | ICD-10-CM | POA: Diagnosis not present

## 2019-12-01 DIAGNOSIS — M797 Fibromyalgia: Secondary | ICD-10-CM | POA: Diagnosis not present

## 2019-12-01 DIAGNOSIS — R269 Unspecified abnormalities of gait and mobility: Secondary | ICD-10-CM | POA: Diagnosis not present

## 2019-12-01 DIAGNOSIS — R413 Other amnesia: Secondary | ICD-10-CM | POA: Diagnosis not present

## 2019-12-01 MED ORDER — LEVETIRACETAM 500 MG PO TABS: 500.0000 mg | ORAL_TABLET | Freq: Two times a day (BID) | ORAL | 3 refills | Status: DC

## 2019-12-01 MED ORDER — AMITRIPTYLINE HCL 10 MG PO TABS: ORAL_TABLET | ORAL | 3 refills | Status: DC

## 2019-12-01 NOTE — Progress Notes (Signed)
Reason for visit: Midthoracic pain, mild memory disturbance, fibromyalgia, history of seizures  Brandy Gutierrez is an 59 y.o. female  History of present illness:  [name] is a 59 year old right-handed white female with a history of a cerebral aneurysm in the past, she has had some long lasting memory issues since that time.  The patient has developed onset of pain just below the scapula on the left with sharp shooting pains that may occur.  The patient is had a least 2 MRI evaluations of the thoracic spine that do not show an etiology of her pain.  The patient will be seeing Dr. Francesco Runner for injections of the back in the near future.  Currently, she is on Lamictal taking 50 mg twice daily, Keppra 250 mg twice daily, and gabapentin 600 mg 4 times daily.  She takes mirtazapine 15 mg at night, she has not been eating well and is losing weight.  The patient also takes amitriptyline 10 mg at night.  The patient is still healing from a fracture of the left foot, she is bearing weight but is walking with a cane.  She has a chronic gait disorder as well.  She did have a recent fall 3 weeks ago when she tripped over a dog toy.  She did not sustain significant injury.  Past Medical History:  Diagnosis Date  . Abnormality of gait 09/10/2016  . Anemia   . Anterior cerebral aneurysm 08/06/2017  . Asthma due to seasonal allergies   . Cervical cancer (HCC)    dx at age 60  . Chronic insomnia   . COPD (chronic obstructive pulmonary disease) ([place])   . Fibromyalgia   . GERD (gastroesophageal reflux disease)   . Headache    "everyday" - for years   . History of kidney stones    passed  . Hypertension   . Memory disorder 04/08/2014  . Pneumonia   . Seizure ([place])   . Somnambulism   . Stomach ulcer   . TIA (transient ischemic attack)    x 6  . Tremor   . Vitamin B12 deficiency     Past Surgical History:  Procedure Laterality Date  . APPENDECTOMY     taken out with hysterectomy  .  CHOLECYSTECTOMY    . COLONOSCOPY    . IR ANGIO INTRA EXTRACRAN SEL COM CAROTID INNOMINATE UNI L MOD SED  07/09/2018  . IR ANGIO INTRA EXTRACRAN SEL INTERNAL CAROTID UNI R MOD SED  07/09/2018  . IR ANGIO VERTEBRAL SEL VERTEBRAL BILAT MOD SED  07/09/2018  . IR ANGIOGRAM FOLLOW UP STUDY  07/09/2018  . IR ANGIOGRAM FOLLOW UP STUDY  07/09/2018  . IR ANGIOGRAM FOLLOW UP STUDY  07/09/2018  . IR ANGIOGRAM FOLLOW UP STUDY  07/09/2018  . IR ANGIOGRAM FOLLOW UP STUDY  07/09/2018  . IR ANGIOGRAM FOLLOW UP STUDY  07/09/2018  . IR NEURO EACH ADD'L AFTER BASIC UNI RIGHT (MS)  07/09/2018  . IR RADIOLOGIST EVAL & MGMT  08/08/2017  . IR TRANSCATH/EMBOLIZ  07/09/2018  . RADIOLOGY WITH ANESTHESIA N/A 07/09/2018   Procedure: RADIOLOGY WITH ANESTHESIA  EMBOLIZATION;  Surgeon: Luanne Bras, MD;  Location: [facility];  Service: Radiology;  Laterality: N/A;  . SALPINGOOPHORECTOMY     age 29 for cervical cancer  . TOTAL ABDOMINAL HYSTERECTOMY     age 63 for cervical cancer    Family History  Problem Relation Age of Onset  . Cancer Mother 76       breast  .  Cancer Maternal Aunt 48       breast  . Cancer Maternal Uncle 62       colon  . Cancer Maternal Grandmother 10       breast  . Stroke Maternal Grandmother   . Cancer Maternal Aunt 40       unknown type of cancer  . Cancer Maternal Uncle 68       GI cancer (? pancreatic)  . Cancer Maternal Aunt 54       breast  . Cancer Father   . Hypertension Sister   . Hypertension Brother   . Cancer Maternal Grandfather 65       throat - smoker  . Cancer Cousin 22       breast  . Cancer Cousin 42       breast    Social history:  reports that she quit smoking about 5 years ago. Her smoking use included cigarettes. She has a 28.00 pack-year smoking history. She has never used smokeless tobacco. She reports previous alcohol use. She reports that she does not use drugs.    Allergies  Allergen Reactions  . Sulfa Antibiotics Hives and Shortness Of Breath    . Metronidazole Other (See Comments)    Unknown reaction type  . Carbamazepine     jittery  . Cymbalta [Duloxetine Hcl] Other (See Comments)    Confusion    Medications:  Prior to Admission medications   Medication Sig Start Date End Date Taking? Authorizing Provider  albuterol (PROAIR HFA) 108 (90 Base) MCG/ACT inhaler Inhale 2 puffs into the lungs every 6 (six) hours as needed for wheezing or shortness of breath.    [provider]  amitriptyline (ELAVIL) 10 MG tablet Take 10 mg by mouth at bedtime. 05/19/18   [provider]  amLODipine (NORVASC) 2.5 MG tablet Take 2.5 mg by mouth daily.  04/28/18   [provider]  aspirin EC 81 MG tablet Take 81 mg by mouth daily.    [provider]  Biotin 10000 MCG TABS Take 10,000 mcg by mouth 2 (two) times daily.    [provider]  Cholecalciferol (VITAMIN D-3) 1000 units CAPS Take 1,000 Units by mouth daily.     [provider]  clopidogrel (PLAVIX) 75 MG tablet Take 75 mg by mouth daily. 06/04/18   [provider]  colestipol (COLESTID) 1 g tablet Take 0.5 g by mouth daily.  05/06/18   [provider]  cyanocobalamin (,VITAMIN B-12,) 1000 MCG/ML injection Inject 1,000 mcg into the muscle every 30 (thirty) days.    [provider]  fexofenadine (ALLEGRA ALLERGY) 180 MG tablet Take 180 mg by mouth daily.    [provider]  folic acid (FOLVITE) 1 MG tablet Take 1 mg by mouth daily. 03/31/14   [provider]  gabapentin (NEURONTIN) 600 MG tablet Take 600 mg by mouth 4 (four) times daily.    [provider]  ipratropium-albuterol (DUONEB) 0.5-2.5 (3) MG/3ML SOLN Take 3 mLs by nebulization every 6 (six) hours as needed (for wheezing/shortness of breath.).    [provider]  lamoTRIgine (LAMICTAL) 25 MG tablet Take 50 mg by mouth 2 (two) times daily.     [provider]  levETIRAcetam (KEPPRA) 250 MG tablet TAKE 1 TABLET BY  MOUTH TWICE DAILY 11/08/19   Kathrynn Ducking, MD  midodrine (PROAMATINE) 2.5 MG tablet Take by mouth. 06/23/19   [provider]  mirtazapine (REMERON) 15 MG tablet Take 15  mg by mouth at bedtime.    [provider]  Multiple Vitamin (MULTIVITAMIN WITH MINERALS) TABS tablet Take 1 tablet by mouth daily at 8 pm.    [provider]  propranolol (INDERAL) 20 MG tablet Take 20 mg by mouth 2 (two) times daily. 06/03/18   [provider]  rosuvastatin (CRESTOR) 20 MG tablet Take 20 mg by mouth at bedtime. 05/01/18   [provider]  thiamine (VITAMIN B-1) 100 MG tablet Take 100 mg by mouth daily.    [provider]  traMADol (ULTRAM) 50 MG tablet TAKE 1 TABLET BY MOUTH EVERY 6 HOURS AS NEEDED 05/03/19   Kathrynn Ducking, MD    ROS:  Out of a complete 14 system review of symptoms, the patient complains only of the following symptoms, and all other reviewed systems are negative.  Mid back pain Insomnia Walking difficulty  Blood pressure 129/83, pulse 78, temperature (!) 97.4 F (36.3 C).  Physical Exam  General: The patient is alert and cooperative at the time of the examination.  The patient is thin.  Skin: No significant peripheral edema is noted.  The left foot is in a brace.   Neurologic Exam  Mental status: The patient is alert and oriented x 3 at the time of the examination. The patient has apparent normal recent and remote memory, with an apparently normal attention span and concentration ability.   Cranial nerves: Facial symmetry is present. Speech is normal, no aphasia or dysarthria is noted. Extraocular movements are full. Visual fields are full.  Motor: The patient has good strength in all 4 extremities.  Sensory examination: Soft touch sensation is symmetric on the face, arms, and legs.  Coordination: The patient has good finger-nose-finger and heel-to-shin bilaterally.  Gait and station: The patient is able to walk a  short distance with a cane, gait is wide-based.  Romberg is positive, the patient tends to fall backwards.  Reflexes: Deep tendon reflexes are symmetric.   Assessment/Plan:  1.  Left inferior scapular pain  2.  Gait disturbance  3.  Memory disturbance  4.  History of cerebral aneurysm  5.  History of seizures  6.  Fibromyalgia  The patient is not doing well with her pain control, she will be seen through a pain center in the near future.  We will increase the amitriptyline to 20 mg at night for 2 weeks and go to 30 mg at night.  The patient will increase the Keppra to 500 mg twice daily.  She will follow-up in 6 months, she will call for any dose adjustments.  Jill Alexanders MD 12/01/2019 11:07 AM  [facility] [address] [place], [place] [address]  Phone [phone] Fax [phone]

## 2019-12-01 NOTE — Patient Instructions (Signed)
We will go up on the dose of the amitriptyline to 20 mg at night for 2 weeks, then take 30 mg.  Increase the keppra to 500 mg twice a day.

## 2019-12-13 DIAGNOSIS — M810 Age-related osteoporosis without current pathological fracture: Secondary | ICD-10-CM | POA: Diagnosis not present

## 2019-12-13 DIAGNOSIS — M859 Disorder of bone density and structure, unspecified: Secondary | ICD-10-CM | POA: Diagnosis not present

## 2019-12-13 DIAGNOSIS — Z0389 Encounter for observation for other suspected diseases and conditions ruled out: Secondary | ICD-10-CM | POA: Diagnosis not present

## 2019-12-16 DIAGNOSIS — M7918 Myalgia, other site: Secondary | ICD-10-CM | POA: Diagnosis not present

## 2019-12-16 DIAGNOSIS — G8929 Other chronic pain: Secondary | ICD-10-CM | POA: Diagnosis not present

## 2019-12-20 ENCOUNTER — Ambulatory Visit (HOSPITAL_COMMUNITY): Payer: Medicare Other

## 2019-12-20 ENCOUNTER — Other Ambulatory Visit: Payer: Self-pay

## 2019-12-20 ENCOUNTER — Ambulatory Visit (HOSPITAL_COMMUNITY)
Admission: RE | Admit: 2019-12-20 | Discharge: 2019-12-20 | Disposition: A | Discharge status: Home / Self Care | Payer: Medicare Other | Source: Ambulatory Visit | Attending: Interventional Radiology | Admitting: Interventional Radiology

## 2019-12-20 ENCOUNTER — Encounter (HOSPITAL_COMMUNITY): Payer: Self-pay

## 2019-12-20 DIAGNOSIS — I671 Cerebral aneurysm, nonruptured: Secondary | ICD-10-CM

## 2019-12-20 DIAGNOSIS — I729 Aneurysm of unspecified site: Secondary | ICD-10-CM | POA: Diagnosis not present

## 2019-12-21 ENCOUNTER — Telehealth (HOSPITAL_COMMUNITY): Payer: Self-pay

## 2019-12-21 NOTE — Telephone Encounter (Signed)
Called pt regarding recent mri, no answer, left vm. AW 

## 2020-01-10 DIAGNOSIS — M47814 Spondylosis without myelopathy or radiculopathy, thoracic region: Secondary | ICD-10-CM | POA: Diagnosis not present

## 2020-01-10 DIAGNOSIS — M546 Pain in thoracic spine: Secondary | ICD-10-CM | POA: Diagnosis not present

## 2020-01-10 DIAGNOSIS — G8929 Other chronic pain: Secondary | ICD-10-CM | POA: Diagnosis not present

## 2020-01-11 DIAGNOSIS — Z0000 Encounter for general adult medical examination without abnormal findings: Secondary | ICD-10-CM | POA: Diagnosis not present

## 2020-01-11 DIAGNOSIS — K921 Melena: Secondary | ICD-10-CM | POA: Diagnosis not present

## 2020-01-11 DIAGNOSIS — M545 Low back pain: Secondary | ICD-10-CM | POA: Diagnosis not present

## 2020-01-11 DIAGNOSIS — J449 Chronic obstructive pulmonary disease, unspecified: Secondary | ICD-10-CM | POA: Diagnosis not present

## 2020-01-11 DIAGNOSIS — E785 Hyperlipidemia, unspecified: Secondary | ICD-10-CM | POA: Diagnosis not present

## 2020-02-03 DIAGNOSIS — G8929 Other chronic pain: Secondary | ICD-10-CM | POA: Diagnosis not present

## 2020-02-03 DIAGNOSIS — M47814 Spondylosis without myelopathy or radiculopathy, thoracic region: Secondary | ICD-10-CM | POA: Diagnosis not present

## 2020-02-03 DIAGNOSIS — M546 Pain in thoracic spine: Secondary | ICD-10-CM | POA: Diagnosis not present

## 2020-02-26 DIAGNOSIS — X58XXXA Exposure to other specified factors, initial encounter: Secondary | ICD-10-CM | POA: Diagnosis not present

## 2020-02-26 DIAGNOSIS — S92515A Nondisplaced fracture of proximal phalanx of left lesser toe(s), initial encounter for closed fracture: Secondary | ICD-10-CM | POA: Diagnosis not present

## 2020-02-26 DIAGNOSIS — S92919A Unspecified fracture of unspecified toe(s), initial encounter for closed fracture: Secondary | ICD-10-CM | POA: Diagnosis not present

## 2020-02-26 DIAGNOSIS — Y9289 Other specified places as the place of occurrence of the external cause: Secondary | ICD-10-CM | POA: Diagnosis not present

## 2020-03-01 DIAGNOSIS — L97522 Non-pressure chronic ulcer of other part of left foot with fat layer exposed: Secondary | ICD-10-CM | POA: Diagnosis not present

## 2020-03-01 DIAGNOSIS — S92515A Nondisplaced fracture of proximal phalanx of left lesser toe(s), initial encounter for closed fracture: Secondary | ICD-10-CM | POA: Diagnosis not present

## 2020-03-01 DIAGNOSIS — M79675 Pain in left toe(s): Secondary | ICD-10-CM | POA: Diagnosis not present

## 2020-03-06 DIAGNOSIS — M546 Pain in thoracic spine: Secondary | ICD-10-CM | POA: Diagnosis not present

## 2020-03-06 DIAGNOSIS — G8929 Other chronic pain: Secondary | ICD-10-CM | POA: Diagnosis not present

## 2020-03-06 DIAGNOSIS — M47814 Spondylosis without myelopathy or radiculopathy, thoracic region: Secondary | ICD-10-CM | POA: Diagnosis not present

## 2020-03-13 ENCOUNTER — Telehealth: Payer: Self-pay | Admitting: Neurology

## 2020-03-13 DIAGNOSIS — W19XXXD Unspecified fall, subsequent encounter: Secondary | ICD-10-CM

## 2020-03-13 DIAGNOSIS — R269 Unspecified abnormalities of gait and mobility: Secondary | ICD-10-CM

## 2020-03-13 NOTE — Telephone Encounter (Signed)
Called pt back to get further information. She reports that she has been falling a lot. Cannot hold onto anything. This started in April after her visit with Dr. Jannifer Franklin on 12/01/2019. Has not started any new medications. Denies any signs of infections. Denies any seizures since last seen.  I updated med list/allergies/pharmacy on file. Advised I will send a message to [facility], Dr. Felecia Shelling. Dr. Jannifer Franklin out this week. Once I receive recommendation back, I will call. She verbalized understanding.

## 2020-03-13 NOTE — Telephone Encounter (Signed)
It is unclear why she is falling more.  The MRI of the brain and thoracic/lumbar spine that she has had over the last year did not show anything that would cause these type of symptoms.    We can check an MRI of the cervical spine without contrast and then she can follow-up with Dr. Jannifer Franklin

## 2020-03-13 NOTE — Telephone Encounter (Signed)
Pt called in and Left a Voicemail at 9:47 am and then again at 11:01 am asking for a return call. No other information was left.

## 2020-03-14 NOTE — Telephone Encounter (Signed)
I called the patient.  The patient had a gradual onset of walking difficulty that began about 3 weeks ago.  The patient is following more frequently, she has a baseline significant gait disturbance anyway with a wide-based gait and positive Romberg.  She usually uses a cane for ambulation.  She denies any recent medication changes or any medications that look different when she picked it up from her pharmacy.  MRI of the cervical spine has been set up.  The patient will reduce her gabapentin dose to 600 mg 3 times daily.  The patient indicates that she has been losing weight when I saw her last time, this could have resulted in some increase in blood levels of the medication.  We will get her set up for physical therapy, the patient prefers to have therapy in [place], [place].

## 2020-03-14 NOTE — Addendum Note (Signed)
Addended by: Wyvonnia Lora on: 03/14/2020 08:23 AM   Modules accepted: Orders

## 2020-03-14 NOTE — Addendum Note (Signed)
Addended by: Kathrynn Ducking on: 03/14/2020 08:43 AM   Modules accepted: Orders

## 2020-03-14 NOTE — Telephone Encounter (Signed)
Called and spoke with pt/husband. Relayed Dr. Garth Bigness message. They are agreeable to this plan. I placed order for MRI cervical wo to be done at [facility] imaging. Pt aware this is where we are sending orders and they will call to scheduled once insurance auth is obtained. Aware I will forward to Dr. Jannifer Franklin so he is aware and we will follow up next week if he would like to do anything further beyond MRI. Advised we will call once results are available.

## 2020-03-27 ENCOUNTER — Other Ambulatory Visit: Payer: Self-pay | Admitting: Neurology

## 2020-04-01 ENCOUNTER — Ambulatory Visit
Admission: RE | Admit: 2020-04-01 | Discharge: 2020-04-01 | Disposition: A | Discharge status: Home / Self Care | Payer: Medicare Other | Source: Ambulatory Visit | Attending: Neurology | Admitting: Neurology

## 2020-04-01 DIAGNOSIS — W19XXXD Unspecified fall, subsequent encounter: Secondary | ICD-10-CM

## 2020-04-01 DIAGNOSIS — R269 Unspecified abnormalities of gait and mobility: Secondary | ICD-10-CM | POA: Diagnosis not present

## 2020-04-03 ENCOUNTER — Telehealth: Payer: Self-pay | Admitting: *Deleted

## 2020-04-03 NOTE — Telephone Encounter (Signed)
-----   Message from Britt Bottom, MD sent at 04/03/2020  2:55 PM EDT ----- Please let her know that the MRI of the cervical spine does show arthritis and disc degenerative changes.  There is no compression of the spinal cord so this would not affect gait.

## 2020-04-03 NOTE — Telephone Encounter (Signed)
Called and spoke with pt about MRI results per Dr. Felecia Shelling note. Pt verbalized understanding. She has already contacted PT to get set up for this. She will call back if she has any new or worsening symptoms.

## 2020-04-04 DIAGNOSIS — G8929 Other chronic pain: Secondary | ICD-10-CM | POA: Diagnosis not present

## 2020-04-04 DIAGNOSIS — M7918 Myalgia, other site: Secondary | ICD-10-CM | POA: Diagnosis not present

## 2020-04-04 DIAGNOSIS — M797 Fibromyalgia: Secondary | ICD-10-CM | POA: Diagnosis not present

## 2020-04-04 DIAGNOSIS — R0782 Intercostal pain: Secondary | ICD-10-CM | POA: Diagnosis not present

## 2020-04-04 DIAGNOSIS — G894 Chronic pain syndrome: Secondary | ICD-10-CM | POA: Diagnosis not present

## 2020-04-05 ENCOUNTER — Other Ambulatory Visit: Payer: Medicare Other

## 2020-04-10 DIAGNOSIS — L97522 Non-pressure chronic ulcer of other part of left foot with fat layer exposed: Secondary | ICD-10-CM | POA: Diagnosis not present

## 2020-04-12 DIAGNOSIS — M545 Low back pain: Secondary | ICD-10-CM | POA: Diagnosis not present

## 2020-04-12 DIAGNOSIS — E785 Hyperlipidemia, unspecified: Secondary | ICD-10-CM | POA: Diagnosis not present

## 2020-04-12 DIAGNOSIS — J449 Chronic obstructive pulmonary disease, unspecified: Secondary | ICD-10-CM | POA: Diagnosis not present

## 2020-04-12 DIAGNOSIS — M79675 Pain in left toe(s): Secondary | ICD-10-CM | POA: Diagnosis not present

## 2020-04-21 DIAGNOSIS — M546 Pain in thoracic spine: Secondary | ICD-10-CM | POA: Diagnosis not present

## 2020-04-21 DIAGNOSIS — G588 Other specified mononeuropathies: Secondary | ICD-10-CM | POA: Diagnosis not present

## 2020-04-26 DIAGNOSIS — G588 Other specified mononeuropathies: Secondary | ICD-10-CM | POA: Diagnosis not present

## 2020-04-26 DIAGNOSIS — G8929 Other chronic pain: Secondary | ICD-10-CM | POA: Diagnosis not present

## 2020-04-26 DIAGNOSIS — M797 Fibromyalgia: Secondary | ICD-10-CM | POA: Diagnosis not present

## 2020-04-26 DIAGNOSIS — M7918 Myalgia, other site: Secondary | ICD-10-CM | POA: Diagnosis not present

## 2020-04-26 DIAGNOSIS — M546 Pain in thoracic spine: Secondary | ICD-10-CM | POA: Diagnosis not present

## 2020-05-08 DIAGNOSIS — G8929 Other chronic pain: Secondary | ICD-10-CM | POA: Diagnosis not present

## 2020-05-08 DIAGNOSIS — M546 Pain in thoracic spine: Secondary | ICD-10-CM | POA: Diagnosis not present

## 2020-05-08 DIAGNOSIS — Z981 Arthrodesis status: Secondary | ICD-10-CM | POA: Diagnosis not present

## 2020-05-08 DIAGNOSIS — R0781 Pleurodynia: Secondary | ICD-10-CM | POA: Diagnosis not present

## 2020-05-08 DIAGNOSIS — Z9889 Other specified postprocedural states: Secondary | ICD-10-CM | POA: Diagnosis not present

## 2020-05-14 ENCOUNTER — Inpatient Hospital Stay (HOSPITAL_COMMUNITY)
Admission: EM | Admit: 2020-05-14 | Discharge: 2020-05-22 | DRG: 177 | Disposition: A | Discharge status: Skilled Nursing Facility | Payer: Medicare Other | Attending: Internal Medicine | Admitting: Internal Medicine

## 2020-05-14 ENCOUNTER — Other Ambulatory Visit: Payer: Self-pay

## 2020-05-14 ENCOUNTER — Emergency Department (HOSPITAL_COMMUNITY): Payer: Medicare Other

## 2020-05-14 ENCOUNTER — Encounter (HOSPITAL_COMMUNITY): Payer: Self-pay

## 2020-05-14 DIAGNOSIS — R197 Diarrhea, unspecified: Secondary | ICD-10-CM | POA: Diagnosis not present

## 2020-05-14 DIAGNOSIS — J440 Chronic obstructive pulmonary disease with acute lower respiratory infection: Secondary | ICD-10-CM | POA: Diagnosis not present

## 2020-05-14 DIAGNOSIS — Z823 Family history of stroke: Secondary | ICD-10-CM

## 2020-05-14 DIAGNOSIS — R413 Other amnesia: Secondary | ICD-10-CM | POA: Diagnosis not present

## 2020-05-14 DIAGNOSIS — J302 Other seasonal allergic rhinitis: Secondary | ICD-10-CM | POA: Diagnosis present

## 2020-05-14 DIAGNOSIS — Z8249 Family history of ischemic heart disease and other diseases of the circulatory system: Secondary | ICD-10-CM

## 2020-05-14 DIAGNOSIS — G9341 Metabolic encephalopathy: Secondary | ICD-10-CM | POA: Diagnosis not present

## 2020-05-14 DIAGNOSIS — J129 Viral pneumonia, unspecified: Secondary | ICD-10-CM

## 2020-05-14 DIAGNOSIS — Z681 Body mass index (BMI) 19 or less, adult: Secondary | ICD-10-CM

## 2020-05-14 DIAGNOSIS — Z8673 Personal history of transient ischemic attack (TIA), and cerebral infarction without residual deficits: Secondary | ICD-10-CM | POA: Diagnosis not present

## 2020-05-14 DIAGNOSIS — Z7902 Long term (current) use of antithrombotics/antiplatelets: Secondary | ICD-10-CM

## 2020-05-14 DIAGNOSIS — J9621 Acute and chronic respiratory failure with hypoxia: Secondary | ICD-10-CM | POA: Diagnosis not present

## 2020-05-14 DIAGNOSIS — R636 Underweight: Secondary | ICD-10-CM | POA: Diagnosis not present

## 2020-05-14 DIAGNOSIS — U071 COVID-19: Secondary | ICD-10-CM | POA: Diagnosis not present

## 2020-05-14 DIAGNOSIS — Z743 Need for continuous supervision: Secondary | ICD-10-CM | POA: Diagnosis not present

## 2020-05-14 DIAGNOSIS — J9601 Acute respiratory failure with hypoxia: Secondary | ICD-10-CM

## 2020-05-14 DIAGNOSIS — G40909 Epilepsy, unspecified, not intractable, without status epilepticus: Secondary | ICD-10-CM | POA: Diagnosis present

## 2020-05-14 DIAGNOSIS — M797 Fibromyalgia: Secondary | ICD-10-CM | POA: Diagnosis present

## 2020-05-14 DIAGNOSIS — Z87891 Personal history of nicotine dependence: Secondary | ICD-10-CM

## 2020-05-14 DIAGNOSIS — Z8541 Personal history of malignant neoplasm of cervix uteri: Secondary | ICD-10-CM

## 2020-05-14 DIAGNOSIS — G250 Essential tremor: Secondary | ICD-10-CM | POA: Diagnosis present

## 2020-05-14 DIAGNOSIS — Z7982 Long term (current) use of aspirin: Secondary | ICD-10-CM

## 2020-05-14 DIAGNOSIS — D649 Anemia, unspecified: Secondary | ICD-10-CM | POA: Diagnosis not present

## 2020-05-14 DIAGNOSIS — Z79899 Other long term (current) drug therapy: Secondary | ICD-10-CM

## 2020-05-14 DIAGNOSIS — J449 Chronic obstructive pulmonary disease, unspecified: Secondary | ICD-10-CM | POA: Diagnosis not present

## 2020-05-14 DIAGNOSIS — I10 Essential (primary) hypertension: Secondary | ICD-10-CM | POA: Diagnosis present

## 2020-05-14 DIAGNOSIS — I671 Cerebral aneurysm, nonruptured: Secondary | ICD-10-CM | POA: Diagnosis not present

## 2020-05-14 DIAGNOSIS — R05 Cough: Secondary | ICD-10-CM | POA: Diagnosis not present

## 2020-05-14 DIAGNOSIS — G894 Chronic pain syndrome: Secondary | ICD-10-CM | POA: Diagnosis present

## 2020-05-14 DIAGNOSIS — E876 Hypokalemia: Secondary | ICD-10-CM | POA: Diagnosis not present

## 2020-05-14 DIAGNOSIS — E538 Deficiency of other specified B group vitamins: Secondary | ICD-10-CM | POA: Diagnosis present

## 2020-05-14 DIAGNOSIS — K219 Gastro-esophageal reflux disease without esophagitis: Secondary | ICD-10-CM | POA: Diagnosis present

## 2020-05-14 DIAGNOSIS — Z882 Allergy status to sulfonamides status: Secondary | ICD-10-CM

## 2020-05-14 DIAGNOSIS — Z8711 Personal history of peptic ulcer disease: Secondary | ICD-10-CM | POA: Diagnosis not present

## 2020-05-14 DIAGNOSIS — J1282 Pneumonia due to coronavirus disease 2019: Secondary | ICD-10-CM | POA: Diagnosis present

## 2020-05-14 DIAGNOSIS — E7800 Pure hypercholesterolemia, unspecified: Secondary | ICD-10-CM | POA: Diagnosis not present

## 2020-05-14 DIAGNOSIS — J441 Chronic obstructive pulmonary disease with (acute) exacerbation: Secondary | ICD-10-CM | POA: Diagnosis present

## 2020-05-14 DIAGNOSIS — M6259 Muscle wasting and atrophy, not elsewhere classified, multiple sites: Secondary | ICD-10-CM | POA: Diagnosis not present

## 2020-05-14 DIAGNOSIS — E785 Hyperlipidemia, unspecified: Secondary | ICD-10-CM | POA: Diagnosis present

## 2020-05-14 DIAGNOSIS — R498 Other voice and resonance disorders: Secondary | ICD-10-CM | POA: Diagnosis not present

## 2020-05-14 DIAGNOSIS — R0602 Shortness of breath: Secondary | ICD-10-CM

## 2020-05-14 DIAGNOSIS — J9600 Acute respiratory failure, unspecified whether with hypoxia or hypercapnia: Secondary | ICD-10-CM | POA: Diagnosis not present

## 2020-05-14 DIAGNOSIS — M6281 Muscle weakness (generalized): Secondary | ICD-10-CM | POA: Diagnosis not present

## 2020-05-14 DIAGNOSIS — R279 Unspecified lack of coordination: Secondary | ICD-10-CM | POA: Diagnosis not present

## 2020-05-14 DIAGNOSIS — R410 Disorientation, unspecified: Secondary | ICD-10-CM | POA: Diagnosis not present

## 2020-05-14 LAB — COMPREHENSIVE METABOLIC PANEL
ALT: 30 U/L (ref 0–44)
AST: 56 U/L — ABNORMAL HIGH (ref 15–41)
Albumin: 3.4 g/dL — ABNORMAL LOW (ref 3.5–5.0)
Alkaline Phosphatase: 91 U/L (ref 38–126)
Anion gap: 10 (ref 5–15)
BUN: 10 mg/dL (ref 6–20)
CO2: 29 mmol/L (ref 22–32)
Calcium: 9.5 mg/dL (ref 8.9–10.3)
Chloride: 95 mmol/L — ABNORMAL LOW (ref 98–111)
Creatinine, Ser: 0.64 mg/dL (ref 0.44–1.00)
GFR calc Af Amer: >60 mL/min (ref >60)
GFR calc non Af Amer: >60 mL/min (ref >60)
Glucose, Bld: 102 mg/dL — ABNORMAL HIGH (ref 70–99)
Potassium: 4 mmol/L (ref 3.5–5.1)
Sodium: 134 mmol/L — ABNORMAL LOW (ref 135–145)
Total Bilirubin: 0.4 mg/dL (ref 0.3–1.2)
Total Protein: 7.4 g/dL (ref 6.5–8.1)

## 2020-05-14 LAB — CBC WITH DIFFERENTIAL/PLATELET
Abs Immature Granulocytes: 0.02 10*3/uL (ref 0.00–0.07)
Basophils Absolute: 0 10*3/uL (ref 0.0–0.1)
Basophils Relative: 0 %
Eosinophils Absolute: 0 10*3/uL (ref 0.0–0.5)
Eosinophils Relative: 0 %
HCT: 33.9 % — ABNORMAL LOW (ref 36.0–46.0)
Hemoglobin: 10.5 g/dL — ABNORMAL LOW (ref 12.0–15.0)
Immature Granulocytes: 0 %
Lymphocytes Relative: 9 %
Lymphs Abs: 0.6 10*3/uL — ABNORMAL LOW (ref 0.7–4.0)
MCH: 34.9 pg — ABNORMAL HIGH (ref 26.0–34.0)
MCHC: 31 g/dL (ref 30.0–36.0)
MCV: 112.6 fL — ABNORMAL HIGH (ref 80.0–100.0)
Monocytes Absolute: 0.9 10*3/uL (ref 0.1–1.0)
Monocytes Relative: 15 %
Neutro Abs: 4.9 10*3/uL (ref 1.7–7.7)
Neutrophils Relative %: 76 %
Platelets: 163 10*3/uL (ref 150–400)
RBC: 3.01 MIL/uL — ABNORMAL LOW (ref 3.87–5.11)
RDW: 14.7 % (ref 11.5–15.5)
WBC: 6.4 10*3/uL (ref 4.0–10.5)
nRBC: 0 % (ref 0.0–0.2)

## 2020-05-14 LAB — RESP PANEL BY RT PCR (RSV, FLU A&B, COVID)
Influenza A by PCR: NEGATIVE
Influenza B by PCR: NEGATIVE
Respiratory Syncytial Virus by PCR: NEGATIVE
SARS Coronavirus 2 by RT PCR: POSITIVE — AB

## 2020-05-14 LAB — URINALYSIS, ROUTINE W REFLEX MICROSCOPIC
Bilirubin Urine: NEGATIVE
Glucose, UA: NEGATIVE mg/dL
Hgb urine dipstick: NEGATIVE
Ketones, ur: 5 mg/dL — AB
Leukocytes,Ua: NEGATIVE
Nitrite: NEGATIVE
Protein, ur: 100 mg/dL — AB
Specific Gravity, Urine: 1.03 (ref 1.005–1.030)
pH: 6 (ref 5.0–8.0)

## 2020-05-14 LAB — LACTIC ACID, PLASMA: Lactic Acid, Venous: 0.9 mmol/L (ref 0.5–1.9)

## 2020-05-14 LAB — TRIGLYCERIDES: Triglycerides: 61 mg/dL (ref <150)

## 2020-05-14 LAB — C-REACTIVE PROTEIN: CRP: 11.6 mg/dL — ABNORMAL HIGH (ref <1.0)

## 2020-05-14 LAB — FERRITIN: Ferritin: 148 ng/mL (ref 11–307)

## 2020-05-14 LAB — PROCALCITONIN: Procalcitonin: 1.58 ng/mL

## 2020-05-14 LAB — LACTATE DEHYDROGENASE: LDH: 285 U/L — ABNORMAL HIGH (ref 98–192)

## 2020-05-14 LAB — FIBRINOGEN: Fibrinogen: 607 mg/dL — ABNORMAL HIGH (ref 210–475)

## 2020-05-14 LAB — D-DIMER, QUANTITATIVE: D-Dimer, Quant: 1.05 ug/mL-FEU — ABNORMAL HIGH (ref 0.00–0.50)

## 2020-05-14 MED ORDER — FOLIC ACID 1 MG PO TABS
1.0000 mg | ORAL_TABLET | Freq: Every day | ORAL | Status: DC
  Administered 2020-05-15 – 2020-05-22 (×7): 1 mg via ORAL
  Filled 2020-05-14 (×8): qty 1

## 2020-05-14 MED ORDER — LEVETIRACETAM 500 MG PO TABS
500.0000 mg | ORAL_TABLET | Freq: Two times a day (BID) | ORAL | Status: DC
  Administered 2020-05-14 – 2020-05-22 (×15): 500 mg via ORAL
  Filled 2020-05-14 (×16): qty 1

## 2020-05-14 MED ORDER — HYDROCOD POLST-CPM POLST ER 10-8 MG/5ML PO SUER: 5.0000 mL | Freq: Two times a day (BID) | ORAL | Status: DC | PRN

## 2020-05-14 MED ORDER — SODIUM CHLORIDE 0.9 % IV SOLN
100.0000 mg | Freq: Every day | INTRAVENOUS | Status: AC
  Administered 2020-05-15 – 2020-05-18 (×4): 100 mg via INTRAVENOUS
  Filled 2020-05-14 (×4): qty 20

## 2020-05-14 MED ORDER — ENOXAPARIN SODIUM 40 MG/0.4ML ~~LOC~~ SOLN
40.0000 mg | SUBCUTANEOUS | Status: DC
  Administered 2020-05-14 – 2020-05-21 (×8): 40 mg via SUBCUTANEOUS
  Filled 2020-05-14 (×9): qty 0.4

## 2020-05-14 MED ORDER — SODIUM CHLORIDE 0.9 % IV SOLN: 200.0000 mg | Freq: Once | INTRAVENOUS | Status: DC

## 2020-05-14 MED ORDER — ONDANSETRON HCL 4 MG PO TABS: 4.0000 mg | ORAL_TABLET | Freq: Four times a day (QID) | ORAL | Status: DC | PRN

## 2020-05-14 MED ORDER — ASPIRIN EC 81 MG PO TBEC
81.0000 mg | DELAYED_RELEASE_TABLET | Freq: Every day | ORAL | Status: DC
  Administered 2020-05-14 – 2020-05-21 (×8): 81 mg via ORAL
  Filled 2020-05-14 (×8): qty 1

## 2020-05-14 MED ORDER — DEXAMETHASONE SODIUM PHOSPHATE 10 MG/ML IJ SOLN
6.0000 mg | Freq: Once | INTRAMUSCULAR | Status: AC
  Administered 2020-05-14: 6 mg via INTRAVENOUS
  Filled 2020-05-14: qty 1

## 2020-05-14 MED ORDER — IPRATROPIUM-ALBUTEROL 20-100 MCG/ACT IN AERS
1.0000 | INHALATION_SPRAY | Freq: Four times a day (QID) | RESPIRATORY_TRACT | Status: DC
  Administered 2020-05-14 – 2020-05-16 (×7): 1 via RESPIRATORY_TRACT
  Filled 2020-05-14 (×2): qty 4

## 2020-05-14 MED ORDER — GUAIFENESIN-DM 100-10 MG/5ML PO SYRP
10.0000 mL | ORAL_SOLUTION | ORAL | Status: DC | PRN
  Administered 2020-05-17 – 2020-05-22 (×7): 10 mL via ORAL
  Filled 2020-05-14 (×8): qty 10

## 2020-05-14 MED ORDER — ZINC SULFATE 220 (50 ZN) MG PO CAPS
220.0000 mg | ORAL_CAPSULE | Freq: Every day | ORAL | Status: DC
  Administered 2020-05-14 – 2020-05-22 (×8): 220 mg via ORAL
  Filled 2020-05-14 (×9): qty 1

## 2020-05-14 MED ORDER — LAMOTRIGINE 25 MG PO TABS
50.0000 mg | ORAL_TABLET | Freq: Two times a day (BID) | ORAL | Status: DC
  Administered 2020-05-14 – 2020-05-22 (×15): 50 mg via ORAL
  Filled 2020-05-14 (×16): qty 2

## 2020-05-14 MED ORDER — DEXAMETHASONE SODIUM PHOSPHATE 10 MG/ML IJ SOLN
6.0000 mg | INTRAMUSCULAR | Status: DC
  Administered 2020-05-15 – 2020-05-16 (×2): 6 mg via INTRAVENOUS
  Filled 2020-05-14 (×2): qty 1

## 2020-05-14 MED ORDER — SODIUM CHLORIDE 0.9 % IV SOLN
200.0000 mg | Freq: Once | INTRAVENOUS | Status: AC
  Administered 2020-05-14: 200 mg via INTRAVENOUS
  Filled 2020-05-14: qty 200

## 2020-05-14 MED ORDER — ASCORBIC ACID 500 MG PO TABS
500.0000 mg | ORAL_TABLET | Freq: Every day | ORAL | Status: DC
  Administered 2020-05-14 – 2020-05-22 (×8): 500 mg via ORAL
  Filled 2020-05-14 (×9): qty 1

## 2020-05-14 MED ORDER — ROSUVASTATIN CALCIUM 20 MG PO TABS
20.0000 mg | ORAL_TABLET | Freq: Every day | ORAL | Status: DC
  Administered 2020-05-14 – 2020-05-21 (×8): 20 mg via ORAL
  Filled 2020-05-14 (×9): qty 1

## 2020-05-14 MED ORDER — SODIUM CHLORIDE 0.9 % IV SOLN: 100.0000 mg | Freq: Every day | INTRAVENOUS | Status: DC

## 2020-05-14 MED ORDER — MIDODRINE HCL 2.5 MG PO TABS
2.5000 mg | ORAL_TABLET | Freq: Two times a day (BID) | ORAL | Status: DC
  Administered 2020-05-14 – 2020-05-15 (×2): 2.5 mg via ORAL
  Filled 2020-05-14 (×2): qty 1

## 2020-05-14 MED ORDER — PANTOPRAZOLE SODIUM 40 MG PO TBEC
40.0000 mg | DELAYED_RELEASE_TABLET | Freq: Two times a day (BID) | ORAL | Status: DC
  Administered 2020-05-14 – 2020-05-22 (×15): 40 mg via ORAL
  Filled 2020-05-14 (×16): qty 1

## 2020-05-14 MED ORDER — ALBUTEROL SULFATE HFA 108 (90 BASE) MCG/ACT IN AERS: 2.0000 | INHALATION_SPRAY | Freq: Four times a day (QID) | RESPIRATORY_TRACT | Status: DC | PRN

## 2020-05-14 MED ORDER — CLOPIDOGREL BISULFATE 75 MG PO TABS
75.0000 mg | ORAL_TABLET | ORAL | Status: DC
  Administered 2020-05-15 – 2020-05-21 (×4): 75 mg via ORAL
  Filled 2020-05-14 (×5): qty 1

## 2020-05-14 MED ORDER — PROPRANOLOL HCL 20 MG PO TABS
20.0000 mg | ORAL_TABLET | Freq: Two times a day (BID) | ORAL | Status: DC
  Administered 2020-05-14 – 2020-05-22 (×15): 20 mg via ORAL
  Filled 2020-05-14 (×17): qty 1

## 2020-05-14 MED ORDER — MIRTAZAPINE 15 MG PO TABS
15.0000 mg | ORAL_TABLET | Freq: Every day | ORAL | Status: DC
  Administered 2020-05-14 – 2020-05-15 (×2): 15 mg via ORAL
  Filled 2020-05-14 (×2): qty 2

## 2020-05-14 MED ORDER — GABAPENTIN 300 MG PO CAPS
600.0000 mg | ORAL_CAPSULE | Freq: Three times a day (TID) | ORAL | Status: DC
  Administered 2020-05-14 – 2020-05-15 (×4): 600 mg via ORAL
  Filled 2020-05-14 (×5): qty 2

## 2020-05-14 MED ORDER — ONDANSETRON HCL 4 MG/2ML IJ SOLN: 4.0000 mg | Freq: Four times a day (QID) | INTRAMUSCULAR | Status: DC | PRN

## 2020-05-14 MED ORDER — ACETAMINOPHEN 325 MG PO TABS
650.0000 mg | ORAL_TABLET | Freq: Four times a day (QID) | ORAL | Status: DC | PRN
  Administered 2020-05-14 – 2020-05-21 (×18): 650 mg via ORAL
  Filled 2020-05-14 (×19): qty 2

## 2020-05-14 NOTE — H&P (Signed)
History and Physical    Brandy Gutierrez GNF:[id number] DOB: 1961-06-08 DOA: 05/14/2020  PCP: Neale Burly, MD  Patient coming from: Home  Chief Complaint: Dyspnea  HPI: Brandy Gutierrez is a 59 y.o. female with medical history significant of seizures. Presenting with 5 days of dyspnea. She tested positive for COVID with a home COVID kit. 4 days ago per her report. She states that she has only try tylenol for relief since the beginning of her symptoms. It has provided no relief. She became concerned when her home pulse oximeter began registering values in the 80's. So she came to the ED. She denies any other alleviating or aggravating factors.    ED Course: Found to be COVID positive. Found to be hypoxic on RA. Started on remdes and decadron. TRH called for admission.   Review of Systems:  She reports HA and sore throat. She denies chest pain, palpitations, fever. Review of systems is otherwise negative for all not mentioned in HPI.   Past Medical History:  Diagnosis Date  . Abnormality of gait 09/10/2016  . Anemia   . Anterior cerebral aneurysm 08/06/2017  . Asthma due to seasonal allergies   . Cervical cancer (HCC)    dx at age 4  . Chronic insomnia   . COPD (chronic obstructive pulmonary disease) ([place])   . Fibromyalgia   . GERD (gastroesophageal reflux disease)   . Headache    "everyday" - for years   . History of kidney stones    passed  . Hypertension   . Memory disorder 04/08/2014  . Pneumonia   . Seizure ([place])   . Somnambulism   . Stomach ulcer   . TIA (transient ischemic attack)    x 6  . Tremor   . Vitamin B12 deficiency     Past Surgical History:  Procedure Laterality Date  . APPENDECTOMY     taken out with hysterectomy  . CHOLECYSTECTOMY    . COLONOSCOPY    . IR ANGIO INTRA EXTRACRAN SEL COM CAROTID INNOMINATE UNI L MOD SED  07/09/2018  . IR ANGIO INTRA EXTRACRAN SEL INTERNAL CAROTID UNI R MOD SED  07/09/2018  . IR ANGIO VERTEBRAL SEL VERTEBRAL BILAT  MOD SED  07/09/2018  . IR ANGIOGRAM FOLLOW UP STUDY  07/09/2018  . IR ANGIOGRAM FOLLOW UP STUDY  07/09/2018  . IR ANGIOGRAM FOLLOW UP STUDY  07/09/2018  . IR ANGIOGRAM FOLLOW UP STUDY  07/09/2018  . IR ANGIOGRAM FOLLOW UP STUDY  07/09/2018  . IR ANGIOGRAM FOLLOW UP STUDY  07/09/2018  . IR NEURO EACH ADD'L AFTER BASIC UNI RIGHT (MS)  07/09/2018  . IR RADIOLOGIST EVAL & MGMT  08/08/2017  . IR TRANSCATH/EMBOLIZ  07/09/2018  . RADIOLOGY WITH ANESTHESIA N/A 07/09/2018   Procedure: RADIOLOGY WITH ANESTHESIA  EMBOLIZATION;  Surgeon: Luanne Bras, MD;  Location: [place] [facility];  Service: Radiology;  Laterality: N/A;  . SALPINGOOPHORECTOMY     age 62 for cervical cancer  . TOTAL ABDOMINAL HYSTERECTOMY     age 2 for cervical cancer     reports that she quit smoking about 5 years ago. Her smoking use included cigarettes. She has a 28.00 pack-year smoking history. She has never used smokeless tobacco. She reports previous alcohol use. She reports that she does not use drugs.  Allergies  Allergen Reactions  . Sulfa Antibiotics Hives and Shortness Of Breath  . Metronidazole Other (See Comments)    Unknown reaction type  . Carbamazepine     jittery  .  Cymbalta [Duloxetine Hcl] Other (See Comments)    Confusion    Family History  Problem Relation Age of Onset  . Cancer Mother 54       breast  . Cancer Maternal Aunt 53       breast  . Cancer Maternal Uncle 26       colon  . Cancer Maternal Grandmother 52       breast  . Stroke Maternal Grandmother   . Cancer Maternal Aunt 40       unknown type of cancer  . Cancer Maternal Uncle 68       GI cancer (? pancreatic)  . Cancer Maternal Aunt 46       breast  . Cancer Father   . Hypertension Sister   . Hypertension Brother   . Cancer Maternal Grandfather 65       throat - smoker  . Cancer Cousin 22       breast  . Cancer Cousin 43       breast    Prior to Admission medications   Medication Sig Start Date End Date Taking? Authorizing  Provider  albuterol (PROAIR HFA) 108 (90 Base) MCG/ACT inhaler Inhale 2 puffs into the lungs every 6 (six) hours as needed for wheezing or shortness of breath.    [provider]  amitriptyline (ELAVIL) 10 MG tablet Two tablets at night for 2 weeks, then take 3 tablets a night Patient taking differently: Take 10 mg by mouth at bedtime. Two tablets at night for 2 weeks, then take 3 tablets a night 12/01/19   Kathrynn Ducking, MD  aspirin EC 81 MG tablet Take 81 mg by mouth daily.    [provider]  Biotin 10000 MCG TABS Take 10,000 mcg by mouth 2 (two) times daily.    [provider]  Cholecalciferol (VITAMIN D-3) 1000 units CAPS Take 1,000 Units by mouth daily.     [provider]  clopidogrel (PLAVIX) 75 MG tablet Take 75 mg by mouth daily. 06/04/18   [provider]  colestipol (COLESTID) 1 g tablet Take 0.5 g by mouth daily.  05/06/18   [provider]  cyanocobalamin (,VITAMIN B-12,) 1000 MCG/ML injection Inject 1,000 mcg into the muscle every 30 (thirty) days.    [provider]  fexofenadine (ALLEGRA ALLERGY) 180 MG tablet Take 180 mg by mouth daily.    [provider]  folic acid (FOLVITE) 1 MG tablet Take 1 mg by mouth daily. 03/31/14   [provider]  gabapentin (NEURONTIN) 600 MG tablet Take 600 mg by mouth in the morning, at noon, and at bedtime.    [provider]  HYDROcodone-acetaminophen (NORCO/VICODIN) 5-325 MG tablet Take by mouth. 11/12/19   [provider]  ipratropium-albuterol (DUONEB) 0.5-2.5 (3) MG/3ML SOLN Take 3 mLs by nebulization every 6 (six) hours as needed (for wheezing/shortness of breath.).    [provider]  lamoTRIgine (LAMICTAL) 25 MG tablet Take 50 mg by mouth 2 (two) times daily.     [provider]  levETIRAcetam (KEPPRA) 500 MG tablet TAKE 1 TABLET BY MOUTH TWICE DAILY 03/27/20   Kathrynn Ducking, MD  midodrine (PROAMATINE) 2.5 MG tablet Take 2.5  mg by mouth 2 (two) times daily with a meal.  06/23/19   [provider]  mirtazapine (REMERON) 15 MG tablet Take 15 mg by mouth at bedtime.    [provider]  Multiple Vitamin (MULTIVITAMIN WITH MINERALS) TABS tablet Take 1 tablet  by mouth daily at 8 pm.    [provider]  propranolol (INDERAL) 20 MG tablet Take 20 mg by mouth 2 (two) times daily. 06/03/18   [provider]  rosuvastatin (CRESTOR) 20 MG tablet Take 20 mg by mouth at bedtime. 05/01/18   [provider]  thiamine (VITAMIN B-1) 100 MG tablet Take 100 mg by mouth daily.    [provider]  traMADol (ULTRAM) 50 MG tablet TAKE 1 TABLET BY MOUTH EVERY 6 HOURS AS NEEDED 05/03/19   Kathrynn Ducking, MD    Physical Exam: Vitals:   05/14/20 1445 05/14/20 1500 05/14/20 1515 05/14/20 1546  BP: 116/75 (!) 116/95 119/75 106/70  Pulse: (!) 122 88 92 93  Resp: 19 (!) '22 16 20  ' Temp:      TempSrc:      SpO2: 96% 100% 97% 96%  Weight:      Height:        General: 59 y.o. female resting in bed in NAD Eyes: PERRL, normal sclera ENMT: Nares patent w/o discharge, orophaynx clear, dentition normal, ears w/o discharge/lesions/ulcers Neck: Supple, trachea midline Cardiovascular: RRR, +S1, S2, no m/g/r, equal pulses throughout Respiratory: decreased at bases, no w/r/r, slightly increased WOB on 2L [place] GI: BS+, NDNT, no masses noted, no organomegaly noted MSK: No e/c/c Skin: No rashes, bruises, ulcerations noted Neuro: A&O x 3, no focal deficits Psyc: Appropriate interaction and affect, calm/cooperative  Labs on Admission: I have personally reviewed following labs and imaging studies  CBC: Recent Labs  Lab 05/14/20 1331  WBC 6.4  NEUTROABS 4.9  HGB 10.5*  HCT 33.9*  MCV 112.6*  PLT 242   Basic Metabolic Panel: Recent Labs  Lab 05/14/20 1331  NA 134*  K 4.0  CL 95*  CO2 29  GLUCOSE 102*  BUN 10  CREATININE 0.64  CALCIUM 9.5   GFR: Estimated Creatinine Clearance: 56  mL/min (by C-G formula based on SCr of 0.64 mg/dL). Liver Function Tests: Recent Labs  Lab 05/14/20 1331  AST 56*  ALT 30  ALKPHOS 91  BILITOT 0.4  PROT 7.4  ALBUMIN 3.4*   No results for input(s): LIPASE, AMYLASE in the last 168 hours. No results for input(s): AMMONIA in the last 168 hours. Coagulation Profile: No results for input(s): INR, PROTIME in the last 168 hours. Cardiac Enzymes: No results for input(s): CKTOTAL, CKMB, CKMBINDEX, TROPONINI in the last 168 hours. BNP (last 3 results) No results for input(s): PROBNP in the last 8760 hours. HbA1C: No results for input(s): HGBA1C in the last 72 hours. CBG: No results for input(s): GLUCAP in the last 168 hours. Lipid Profile: Recent Labs    05/14/20 1331  TRIG 61   Thyroid Function Tests: No results for input(s): TSH, T4TOTAL, FREET4, T3FREE, THYROIDAB in the last 72 hours. Anemia Panel: Recent Labs    05/14/20 1331  FERRITIN 148   Urine analysis: No results found for: COLORURINE, APPEARANCEUR, LABSPEC, PHURINE, GLUCOSEU, HGBUR, BILIRUBINUR, KETONESUR, PROTEINUR, UROBILINOGEN, NITRITE, LEUKOCYTESUR  Radiological Exams on Admission: DG Chest Port 1 View  Result Date: 05/14/2020 CLINICAL DATA:  Cough EXAM: PORTABLE CHEST 1 VIEW COMPARISON:  11/09/2019 FINDINGS: Artifact from EKG leads. Normal heart size and mediastinal contours. No acute infiltrate or edema. No effusion or pneumothorax. No acute osseous findings. IMPRESSION: No active disease. Electronically Signed   By: Monte Fantasia M.D.   On: 05/14/2020 13:47   Assessment/Plan COVID 19 Hypoxia     - admit to inpt, med-surg     -  remdes, decadron, albuterol, IS/Flutter     - O2 as needed  Seizure d/o     - continue home regimen (keppra, lamicta?)  HLD     - continue home regimen (crestor?)  DVT prophylaxis: lovenox  Code Status: FULL  Family Communication: None at bedside  Consults called: None  Admission status: Inpatient d/t higher than baseline  O2 requirements and IV medication intervention.   Status is: Inpatient  Remains inpatient appropriate because:Inpatient level of care appropriate due to severity of illness   Dispo: The patient is from: Home              Anticipated d/c is to: Home              Anticipated d/c date is: 3 days              Patient currently is not medically stable to d/c.  Jonnie Finner DO [facility]  If 7PM-7AM, please contact night-coverage [email or web address]  05/14/2020, 4:09 PM

## 2020-05-14 NOTE — ED Triage Notes (Signed)
Pt reports she started experiencing symptoms of COVID last Tuesday. Pt states she tested positive 4 days ago. Pts husband reports at times pt gets so [facility] that she cannot catch her breath. Pt initially 56% on RA. Placed on 2L and SpO2 is 100%. Hx of COPD.

## 2020-05-14 NOTE — ED Provider Notes (Signed)
[facility] DEPT Provider Note   CSN: [id number] Arrival date & time: 05/14/20  1115     History Chief Complaint  Patient presents with  . Covid Positive  . Shortness of Breath    Brandy Gutierrez is a 59 y.o. female.  HPI   Patient with significant medical history of COPD, active smoker, GERD, hypertension, TIA presents the emergency department with chief complaint of shortness of breath and feeling worse after testing positive for Covid.  Patient states she took a home Covid test on Thursday which revealed that she was Covid positive.  Since then she has had increasing headaches, sore throat, productive cough, and worsening shortness of breath.  Patient states she has a home pulse ox which read she had a O2 reading of 86% on room air.  She then came in the emergency department where she was placed on supplemental oxygen.  Patient denies nausea, vomiting, diarrhea, is tolerating p.o. without any difficulties.  Patient denies any alleviating or adding factors at this time.  Patient is not COVID vaccinated.  She denies chest pain, abdominal pain, nausea, vomiting or diarrhea, pedal edema.  Past Medical History:  Diagnosis Date  . Abnormality of gait 09/10/2016  . Anemia   . Anterior cerebral aneurysm 08/06/2017  . Asthma due to seasonal allergies   . Cervical cancer (HCC)    dx at age 37  . Chronic insomnia   . COPD (chronic obstructive pulmonary disease) ([place])   . Fibromyalgia   . GERD (gastroesophageal reflux disease)   . Headache    "everyday" - for years   . History of kidney stones    passed  . Hypertension   . Memory disorder 04/08/2014  . Pneumonia   . Seizure ([place])   . Somnambulism   . Stomach ulcer   . TIA (transient ischemic attack)    x 6  . Tremor   . Vitamin B12 deficiency     Patient Active Problem List   Diagnosis Date Noted  . COVID-19 05/14/2020  . Seizure disorder ([place]) 09/03/2018  . Brain aneurysm 07/09/2018  .  Anterior cerebral aneurysm 08/06/2017  . Abnormality of gait 09/10/2016  . Occasional tremors 12/07/2014  . Fibromyalgia 04/08/2014  . Memory disorder 04/08/2014  . Family history of malignant neoplasm of breast 01/14/2014  . Family history of malignant neoplasm of gastrointestinal tract 01/14/2014  . Cervical ca ([place]) 01/14/2014    Past Surgical History:  Procedure Laterality Date  . APPENDECTOMY     taken out with hysterectomy  . CHOLECYSTECTOMY    . COLONOSCOPY    . IR ANGIO INTRA EXTRACRAN SEL COM CAROTID INNOMINATE UNI L MOD SED  07/09/2018  . IR ANGIO INTRA EXTRACRAN SEL INTERNAL CAROTID UNI R MOD SED  07/09/2018  . IR ANGIO VERTEBRAL SEL VERTEBRAL BILAT MOD SED  07/09/2018  . IR ANGIOGRAM FOLLOW UP STUDY  07/09/2018  . IR ANGIOGRAM FOLLOW UP STUDY  07/09/2018  . IR ANGIOGRAM FOLLOW UP STUDY  07/09/2018  . IR ANGIOGRAM FOLLOW UP STUDY  07/09/2018  . IR ANGIOGRAM FOLLOW UP STUDY  07/09/2018  . IR ANGIOGRAM FOLLOW UP STUDY  07/09/2018  . IR NEURO EACH ADD'L AFTER BASIC UNI RIGHT (MS)  07/09/2018  . IR RADIOLOGIST EVAL & MGMT  08/08/2017  . IR TRANSCATH/EMBOLIZ  07/09/2018  . RADIOLOGY WITH ANESTHESIA N/A 07/09/2018   Procedure: RADIOLOGY WITH ANESTHESIA  EMBOLIZATION;  Surgeon: Luanne Bras, MD;  Location: [facility];  Service: Radiology;  Laterality:  N/A;  . SALPINGOOPHORECTOMY     age 24 for cervical cancer  . TOTAL ABDOMINAL HYSTERECTOMY     age 63 for cervical cancer     OB History   No obstetric history on file.     Family History  Problem Relation Age of Onset  . Cancer Mother 37       breast  . Cancer Maternal Aunt 87       breast  . Cancer Maternal Uncle 80       colon  . Cancer Maternal Grandmother 77       breast  . Stroke Maternal Grandmother   . Cancer Maternal Aunt 40       unknown type of cancer  . Cancer Maternal Uncle 68       GI cancer (? pancreatic)  . Cancer Maternal Aunt 53       breast  . Cancer Father   . Hypertension Sister   .  Hypertension Brother   . Cancer Maternal Grandfather 65       throat - smoker  . Cancer Cousin 22       breast  . Cancer Cousin 84       breast    Social History   Tobacco Use  . Smoking status: Former Smoker    Packs/day: 1.00    Years: 28.00    Pack years: 28.00    Types: Cigarettes    Quit date: 06/19/2014    Years since quitting: 5.9  . Smokeless tobacco: Never Used  Vaping Use  . Vaping Use: Every day  . Substances: Nicotine  Substance Use Topics  . Alcohol use: Not Currently    Alcohol/week: 0.0 standard drinks  . Drug use: No    Home Medications Prior to Admission medications   Medication Sig Start Date End Date Taking? Authorizing Provider  albuterol (PROAIR HFA) 108 (90 Base) MCG/ACT inhaler Inhale 2 puffs into the lungs every 6 (six) hours as needed for wheezing or shortness of breath.    [provider]  amitriptyline (ELAVIL) 10 MG tablet Two tablets at night for 2 weeks, then take 3 tablets a night Patient taking differently: Take 10 mg by mouth at bedtime. Two tablets at night for 2 weeks, then take 3 tablets a night 12/01/19   Kathrynn Ducking, MD  aspirin EC 81 MG tablet Take 81 mg by mouth daily.    [provider]  Biotin 10000 MCG TABS Take 10,000 mcg by mouth 2 (two) times daily.    [provider]  Cholecalciferol (VITAMIN D-3) 1000 units CAPS Take 1,000 Units by mouth daily.     [provider]  clopidogrel (PLAVIX) 75 MG tablet Take 75 mg by mouth daily. 06/04/18   [provider]  colestipol (COLESTID) 1 g tablet Take 0.5 g by mouth daily.  05/06/18   [provider]  cyanocobalamin (,VITAMIN B-12,) 1000 MCG/ML injection Inject 1,000 mcg into the muscle every 30 (thirty) days.    [provider]  fexofenadine (ALLEGRA ALLERGY) 180 MG tablet Take 180 mg by mouth daily.    [provider]  folic acid (FOLVITE) 1 MG tablet Take 1 mg by mouth daily. 03/31/14   [provider]    gabapentin (NEURONTIN) 600 MG tablet Take 600 mg by mouth in the morning, at noon, and at bedtime.    [provider]  HYDROcodone-acetaminophen (NORCO/VICODIN) 5-325 MG tablet Take by mouth. 11/12/19   [provider]  ipratropium-albuterol (DUONEB) 0.5-2.5 (3) MG/3ML SOLN Take 3 mLs by nebulization every 6 (six) hours as needed (for wheezing/shortness of breath.).    [provider]  lamoTRIgine (LAMICTAL) 25 MG tablet Take 50 mg by mouth 2 (two) times daily.     [provider]  levETIRAcetam (KEPPRA) 500 MG tablet TAKE 1 TABLET BY MOUTH TWICE DAILY 03/27/20   Kathrynn Ducking, MD  midodrine (PROAMATINE) 2.5 MG tablet Take 2.5 mg by mouth 2 (two) times daily with a meal.  06/23/19   [provider]  mirtazapine (REMERON) 15 MG tablet Take 15 mg by mouth at bedtime.    [provider]  Multiple Vitamin (MULTIVITAMIN WITH MINERALS) TABS tablet Take 1 tablet by mouth daily at 8 pm.    [provider]  propranolol (INDERAL) 20 MG tablet Take 20 mg by mouth 2 (two) times daily. 06/03/18   [provider]  rosuvastatin (CRESTOR) 20 MG tablet Take 20 mg by mouth at bedtime. 05/01/18   [provider]  thiamine (VITAMIN B-1) 100 MG tablet Take 100 mg by mouth daily.    [provider]  traMADol (ULTRAM) 50 MG tablet TAKE 1 TABLET BY MOUTH EVERY 6 HOURS AS NEEDED 05/03/19   Kathrynn Ducking, MD    Allergies    Sulfa antibiotics, Metronidazole, Carbamazepine, and Cymbalta [duloxetine hcl]  Review of Systems   Review of Systems  Constitutional: Positive for chills and fever.  HENT: Positive for congestion and sore throat. Negative for tinnitus and trouble swallowing.   Eyes: Negative for visual disturbance.  Respiratory: Positive for cough and shortness of breath.   Cardiovascular: Negative for chest pain.  Gastrointestinal: Negative for abdominal pain, diarrhea, nausea and vomiting.  Genitourinary: Negative for  dysuria, enuresis, flank pain and vaginal discharge.  Musculoskeletal: Negative for back pain.  Skin: Negative for rash.  Neurological: Negative for dizziness and headaches.  Hematological: Does not bruise/bleed easily.    Physical Exam Updated Vital Signs BP 121/89   Pulse 81   Temp 100.2 F (37.9 C) (Rectal)   Resp 19   Ht 5\' 5"  (1.651 m)   Wt 46.3 kg   SpO2 98%   BMI 16.97 kg/m   Physical Exam Vitals and nursing note reviewed.  Constitutional:      General: She is not in acute distress.    Appearance: She is ill-appearing.  HENT:     Head: Normocephalic and atraumatic.     Nose: Congestion present.     Mouth/Throat:     Mouth: Mucous membranes are moist.     Pharynx: Oropharynx is clear. No oropharyngeal exudate or posterior oropharyngeal erythema.  Eyes:     General: No scleral icterus. Cardiovascular:     Rate and Rhythm: Regular rhythm. Tachycardia present.     Pulses: Normal pulses.     Heart sounds: No murmur heard.  No friction rub. No gallop.   Pulmonary:     Effort: No respiratory distress.     Breath sounds: No wheezing, rhonchi or rales.     Comments: Patient on 2 L nasal cannula, no obvious signs of respiratory distress, lung sounds bilateral rales and rhonchi no wheezing or stridor heard. Abdominal:     General: There is no distension.     Palpations: Abdomen is soft.     Tenderness: There is no abdominal tenderness. There is no right CVA tenderness, left CVA tenderness or guarding.  Musculoskeletal:        General: No swelling  or tenderness.     Right lower leg: No edema.     Left lower leg: No edema.  Skin:    General: Skin is warm and dry.     Findings: No rash.  Neurological:     General: No focal deficit present.     Mental Status: She is alert.  Psychiatric:        Mood and Affect: Mood normal.     ED Results / Procedures / Treatments   Labs (all labs ordered are listed, but only abnormal results are displayed) Labs Reviewed  RESP  PANEL BY RT PCR (RSV, FLU A&B, COVID) - Abnormal; Notable for the following components:      Result Value   SARS Coronavirus 2 by RT PCR POSITIVE (*)    All other components within normal limits  CBC WITH DIFFERENTIAL/PLATELET - Abnormal; Notable for the following components:   RBC 3.01 (*)    Hemoglobin 10.5 (*)    HCT 33.9 (*)    MCV 112.6 (*)    MCH 34.9 (*)    Lymphs Abs 0.6 (*)    All other components within normal limits  COMPREHENSIVE METABOLIC PANEL - Abnormal; Notable for the following components:   Sodium 134 (*)    Chloride 95 (*)    Glucose, Bld 102 (*)    Albumin 3.4 (*)    AST 56 (*)    All other components within normal limits  D-DIMER, QUANTITATIVE (NOT AT [facility]) - Abnormal; Notable for the following components:   D-Dimer, Quant 1.05 (*)    All other components within normal limits  LACTATE DEHYDROGENASE - Abnormal; Notable for the following components:   LDH 285 (*)    All other components within normal limits  FIBRINOGEN - Abnormal; Notable for the following components:   Fibrinogen 607 (*)    All other components within normal limits  C-REACTIVE PROTEIN - Abnormal; Notable for the following components:   CRP 11.6 (*)    All other components within normal limits  CULTURE, BLOOD (ROUTINE X 2)  CULTURE, BLOOD (ROUTINE X 2)  LACTIC ACID, PLASMA  PROCALCITONIN  FERRITIN  TRIGLYCERIDES    EKG None  Radiology DG Chest Port 1 View  Result Date: 05/14/2020 CLINICAL DATA:  Cough EXAM: PORTABLE CHEST 1 VIEW COMPARISON:  11/09/2019 FINDINGS: Artifact from EKG leads. Normal heart size and mediastinal contours. No acute infiltrate or edema. No effusion or pneumothorax. No acute osseous findings. IMPRESSION: No active disease. Electronically Signed   By: Monte Fantasia M.D.   On: 05/14/2020 13:47    Procedures .Critical Care Performed by: Marcello Fennel, PA-C Authorized by: Marcello Fennel, PA-C   Critical care provider statement:    Critical care  time (minutes):  30   Critical care time was exclusive of:  Separately billable procedures and treating other patients   Critical care was necessary to treat or prevent imminent or life-threatening deterioration of the following conditions:  Respiratory failure   Critical care was time spent personally by me on the following activities:  Discussions with consultants, evaluation of patient's response to treatment, examination of patient, ordering and performing treatments and interventions, ordering and review of laboratory studies, ordering and review of radiographic studies, pulse oximetry, re-evaluation of patient's condition, obtaining history from patient or surrogate and review of old charts   I assumed direction of critical care for this patient from another provider in my specialty: no     (including critical care time)  Medications  Ordered in ED Medications  dexamethasone (DECADRON) injection 6 mg (6 mg Intravenous Given 05/14/20 1335)    ED Course  I have reviewed the triage vital signs and the nursing notes.  Pertinent labs & imaging results that were available during my care of the patient were reviewed by me and considered in my medical decision making (see chart for details).    MDM Rules/Calculators/A&P                          I have personally reviewed all imaging, labs and have interpreted them.  Patient presents with shortness of breath and Covid positive.  Patient did not appear to be acute distress, she was alert, on 2 L via nasal cannula, vital signs significant for tachycardia.  Will order pre-Covid labs, continue with nasal cannula and start patient on Decadron.  Initial labs reveal Covid positive, negative for influenza A/B, RSV, CMP showing hyponatremia of 134, hyperglycemia of 333, no metabolic acidosis, hypoalbumin of 3.4, elevated AST of 56, no AKI, no anion gap.  CBC revealing no leukocytosis, macrocytic anemia which appears to be at baseline for patient.  Chest  x-ray does not show any acute abnormalities.  EKG showing sinus rhythm without signs of ischemia, no ST elevation or depression noted.  Patient was reassessed after applying supplement O2 and providing Decadron, patient has responded well, tachycardia and tachypnea have resolved, patient satting at 96% on 2 L nasal cannula.  Due to new oxygen requirements and recent Covid positive will consult with hospitalist for further evaluation and management. Spoke with Dr Jiles Prows of the hospitalist team that the patient should be admitted for further evaluation and treatment.  He will come assess the patient.  I have low suspicion for systemic infection as there is no leukocytosis noted on CBC, chest x-ray does not reveal any acute abnormalities.  She does not meet sepsis criteria.  I suspect initial tachycardia and tachypnea are due to hypoxia caused by Covid.  This resolved after starting her on 2 L nonrebreather as well as providing her with steroids.  Low suspicion for PE as patient denies pleuritic chest pain, no leg swelling or leg pain on exam, she has little risk factors.  Low suspicion for ACS as patient denies chest pain, no signs of hypoperfusion or fluid overload on exam, EKG was sinus rhythm without signs of ischemia.  I suspect patient's symptoms are secondary to Covid infection.  Vital signs have remained stable does not appear in acute distress.  Patient will be transferred to hospitalist team for further management. Final Clinical Impression(s) / ED Diagnoses Final diagnoses:  COVID-19 virus infection  Acute respiratory failure with hypoxia [facility])    Rx / DC Orders ED Discharge Orders    None       Marcello Fennel, PA-C 05/14/20 1657    Tegeler, Gwenyth Allegra, MD 05/15/20 [phone]

## 2020-05-15 LAB — COMPREHENSIVE METABOLIC PANEL
ALT: 25 U/L (ref 0–44)
AST: 44 U/L — ABNORMAL HIGH (ref 15–41)
Albumin: 2.8 g/dL — ABNORMAL LOW (ref 3.5–5.0)
Alkaline Phosphatase: 73 U/L (ref 38–126)
Anion gap: 14 (ref 5–15)
BUN: 10 mg/dL (ref 6–20)
CO2: 28 mmol/L (ref 22–32)
Calcium: 8.7 mg/dL — ABNORMAL LOW (ref 8.9–10.3)
Chloride: 94 mmol/L — ABNORMAL LOW (ref 98–111)
Creatinine, Ser: 0.59 mg/dL (ref 0.44–1.00)
GFR calc Af Amer: >60 mL/min (ref >60)
GFR calc non Af Amer: >60 mL/min (ref >60)
Glucose, Bld: 90 mg/dL (ref 70–99)
Potassium: 3.8 mmol/L (ref 3.5–5.1)
Sodium: 136 mmol/L (ref 135–145)
Total Bilirubin: 0.4 mg/dL (ref 0.3–1.2)
Total Protein: 6.3 g/dL — ABNORMAL LOW (ref 6.5–8.1)

## 2020-05-15 LAB — CBC WITH DIFFERENTIAL/PLATELET
Abs Immature Granulocytes: 0.02 10*3/uL (ref 0.00–0.07)
Basophils Absolute: 0 10*3/uL (ref 0.0–0.1)
Basophils Relative: 0 %
Eosinophils Absolute: 0 10*3/uL (ref 0.0–0.5)
Eosinophils Relative: 0 %
HCT: 31.1 % — ABNORMAL LOW (ref 36.0–46.0)
Hemoglobin: 9.9 g/dL — ABNORMAL LOW (ref 12.0–15.0)
Immature Granulocytes: 0 %
Lymphocytes Relative: 12 %
Lymphs Abs: 0.6 10*3/uL — ABNORMAL LOW (ref 0.7–4.0)
MCH: 35.1 pg — ABNORMAL HIGH (ref 26.0–34.0)
MCHC: 31.8 g/dL (ref 30.0–36.0)
MCV: 110.3 fL — ABNORMAL HIGH (ref 80.0–100.0)
Monocytes Absolute: 0.9 10*3/uL (ref 0.1–1.0)
Monocytes Relative: 17 %
Neutro Abs: 3.6 10*3/uL (ref 1.7–7.7)
Neutrophils Relative %: 71 %
Platelets: 160 10*3/uL (ref 150–400)
RBC: 2.82 MIL/uL — ABNORMAL LOW (ref 3.87–5.11)
RDW: 14.5 % (ref 11.5–15.5)
WBC: 5.2 10*3/uL (ref 4.0–10.5)
nRBC: 0 % (ref 0.0–0.2)

## 2020-05-15 LAB — D-DIMER, QUANTITATIVE: D-Dimer, Quant: 0.7 ug/mL-FEU — ABNORMAL HIGH (ref 0.00–0.50)

## 2020-05-15 LAB — MAGNESIUM: Magnesium: 1.6 mg/dL — ABNORMAL LOW (ref 1.7–2.4)

## 2020-05-15 LAB — PHOSPHORUS: Phosphorus: 3 mg/dL (ref 2.5–4.6)

## 2020-05-15 MED ORDER — AMITRIPTYLINE HCL 10 MG PO TABS
10.0000 mg | ORAL_TABLET | Freq: Every day | ORAL | Status: DC
  Administered 2020-05-15: 10 mg via ORAL
  Filled 2020-05-15 (×2): qty 1

## 2020-05-15 MED ORDER — CYCLOBENZAPRINE HCL 10 MG PO TABS
5.0000 mg | ORAL_TABLET | Freq: Once | ORAL | Status: AC
  Administered 2020-05-15: 5 mg via ORAL
  Filled 2020-05-15: qty 1

## 2020-05-15 MED ORDER — HYDROCODONE-ACETAMINOPHEN 5-325 MG PO TABS
1.0000 | ORAL_TABLET | ORAL | Status: DC | PRN
  Administered 2020-05-15 (×2): 1 via ORAL
  Filled 2020-05-15 (×2): qty 1

## 2020-05-15 MED ORDER — MAGNESIUM SULFATE 2 GM/50ML IV SOLN
2.0000 g | Freq: Once | INTRAVENOUS | Status: AC
  Administered 2020-05-15: 2 g via INTRAVENOUS
  Filled 2020-05-15: qty 50

## 2020-05-15 MED ORDER — TRAMADOL HCL 50 MG PO TABS
50.0000 mg | ORAL_TABLET | Freq: Once | ORAL | Status: AC
  Administered 2020-05-15: 50 mg via ORAL
  Filled 2020-05-15: qty 1

## 2020-05-15 NOTE — Progress Notes (Signed)
[facility] Progress Note  Patient: Brandy Gutierrez    OIN:[id number]  DOA: 05/14/2020     Date of Service: the patient was seen and examined on 05/15/2020  Brief hospital course: Past medical history of COPD, menorrhagia, GERD, HTN, seizure disorder, recurrent TIAs, B12 deficiency, cervical cancer.  Presents with complaints of shortness of breath found to have COVID-19 pneumonia with acute hypoxic respiratory failure. Currently plan is continue treatment for COVID-19 pneumonia.  Assessment and Plan: 1. Acute COVID-19 Viral Pneumonia Acute hypoxic respiratory failure pretension acute exacerbation of COPD CXR: hazy bilateral peripheral opacities Oxygen requirement: On 2 LPM.  88% on room air. CRP: 11.6 Remdesivir: Started on 05/14/2020 Steroids: Decadron started on 05/14/2020 Baricitinib/Actemra(off-label use): Currently no indication for now The investigational nature of this medication was discussed with the patient/HCPOA and they choose to proceed as the potential benefits are felt to outweigh risks at this time.  Antibiotics: Currently not indicated Vitamin C and Zinc: Continue Continue inhalers for COPD exacerbation Prone positioning and incentive spirometer use recommended.  The treatment plan and use of medications and known side effects were discussed with patient/family. It was clearly explained that Complete risks and long-term side effects are unknown, however in the best clinical judgment they seem to be of some clinical benefit rather than medical risks. Patient/family agree with the treatment plan and want to receive these treatments as indicated.   2.  Fibromyalgia, depression. Continue amitriptyline, gabapentin, Norco, Remeron.  3.  History of seizure disorders Currently no active seizures. Still remains somewhat confused on examination although oriented when questions are asked. No focal deficit. Continue Lamictal 50 mg twice daily as well as Keppra 500 mg twice  daily.  4.  GERD Continue PPI.  5.  Essential tremors Continue Inderal.  6.  Hyperlipidemia Continue Crestor  7.  Underweight Body mass index is 16.97 kg/m.  Will consult dietary.  May help her to develop malnutrition.  Diet: Regular diet DVT Prophylaxis:   enoxaparin (LOVENOX) injection 40 mg Start: 05/14/20 2200    Advance goals of care discussion: Full code  Family Communication: no family was present at bedside, at the time of interview.   Disposition:  Status is: Inpatient  Remains inpatient appropriate because:Inpatient level of care appropriate due to severity of illness   Dispo: The patient is from: Home              Anticipated d/c is to: Home              Anticipated d/c date is: 3 days              Patient currently is not medically stable to d/c.  Subjective: Reports chronic back pain.  No nausea no vomiting.  No fever no chills.  Also has shortness of breath.    Physical Exam:  General: Appear in moderate distress, no Rash; Oral Mucosa Clear, moist. no Abnormal Neck Mass Or lumps, Conjunctiva normal  Cardiovascular: S1 and S2 Present, no Murmur, Respiratory: increased respiratory effort, Bilateral Air entry present and no Crackles, no wheezes Abdomen: Bowel Sound present, Soft and no tenderness Extremities: no Pedal edema Neurology: Appears confused but alert and oriented to time, place, and person affect flat in affect. no new focal deficit Gait not checked due to patient safety concerns  Vitals:   05/15/20 1700 05/15/20 1715 05/15/20 1800 05/15/20 1830  BP: 124/81  (!) 147/85 (!) 124/92  Pulse: 97  86 87  Resp: (!) 24   (!) 22  Temp:      TempSrc:      SpO2: (!) 88% 93% 97% 99%  Weight:      Height:        Intake/Output Summary (Last 24 hours) at 05/15/2020 1841 Last data filed at 05/15/2020 1208 Gross per 24 hour  Intake 350 ml  Output --  Net 350 ml   Filed Weights   05/14/20 1134  Weight: 46.3 kg    Data Reviewed: I have  personally reviewed and interpreted daily labs, tele strips, imagings as discussed above. I reviewed all nursing notes, pharmacy notes, vitals, pertinent old records I have discussed plan of care as described above with RN and patient/family.  CBC: Recent Labs  Lab 05/14/20 1331 05/15/20 0435  WBC 6.4 5.2  NEUTROABS 4.9 3.6  HGB 10.5* 9.9*  HCT 33.9* 31.1*  MCV 112.6* 110.3*  PLT 163 179   Basic Metabolic Panel: Recent Labs  Lab 05/14/20 1331 05/15/20 0435  NA 134* 136  K 4.0 3.8  CL 95* 94*  CO2 29 28  GLUCOSE 102* 90  BUN 10 10  CREATININE 0.64 0.59  CALCIUM 9.5 8.7*  MG  --  1.6*  PHOS  --  3.0    Studies: No results found.  Scheduled Meds: . amitriptyline  10 mg Oral QHS  . vitamin C  500 mg Oral Daily  . aspirin EC  81 mg Oral QHS  . clopidogrel  75 mg Oral QODAY  . dexamethasone (DECADRON) injection  6 mg Intravenous Q24H  . enoxaparin (LOVENOX) injection  40 mg Subcutaneous Q24H  . folic acid  1 mg Oral Daily  . gabapentin  600 mg Oral TID  . Ipratropium-Albuterol  1 puff Inhalation Q6H  . lamoTRIgine  50 mg Oral BID  . levETIRAcetam  500 mg Oral BID  . mirtazapine  15 mg Oral QHS  . pantoprazole  40 mg Oral BID  . propranolol  20 mg Oral BID  . rosuvastatin  20 mg Oral QHS  . zinc sulfate  220 mg Oral Daily   Continuous Infusions: . remdesivir 100 mg in NS 100 mL Stopped (05/15/20 1208)   PRN Meds: acetaminophen, albuterol, chlorpheniramine-HYDROcodone, guaiFENesin-dextromethorphan, HYDROcodone-acetaminophen, ondansetron **OR** ondansetron (ZOFRAN) IV  Time spent: 35 minutes  Author: Berle Mull, MD [facility] 05/15/2020 6:41 PM  To reach On-call, see care teams to locate the attending and reach out via [email or web address]. Between 7PM-7AM, please contact night-coverage If you still have difficulty reaching the attending provider, please page the [facility] (Director on Call) for [facility] on amion for assistance.

## 2020-05-15 NOTE — ED Notes (Signed)
Breakfast tray delivered

## 2020-05-15 NOTE — ED Notes (Signed)
Pt looked behind her and said "stop it" to someone that wasn't there

## 2020-05-15 NOTE — ED Notes (Signed)
Pt attempted to get out of bed even w/ bed alarm off. Pt stated that she "needed some underwear." She had removed her brief because "it was nasty". She also pulled her O2 off in the process. Placed pt back in bed with call light in reach, bed alarm set, and [facility] in place.

## 2020-05-15 NOTE — ED Notes (Signed)
Assumed care of patient at this time, nad noted, sr up x2, bed locked and low, call bell w/I reach.  Will continue to monitor. ° °

## 2020-05-16 ENCOUNTER — Inpatient Hospital Stay (HOSPITAL_COMMUNITY): Payer: Medicare Other

## 2020-05-16 LAB — COMPREHENSIVE METABOLIC PANEL
ALT: 22 U/L (ref 0–44)
AST: 45 U/L — ABNORMAL HIGH (ref 15–41)
Albumin: 3 g/dL — ABNORMAL LOW (ref 3.5–5.0)
Alkaline Phosphatase: 76 U/L (ref 38–126)
Anion gap: 15 (ref 5–15)
BUN: 12 mg/dL (ref 6–20)
CO2: 26 mmol/L (ref 22–32)
Calcium: 8.5 mg/dL — ABNORMAL LOW (ref 8.9–10.3)
Chloride: 96 mmol/L — ABNORMAL LOW (ref 98–111)
Creatinine, Ser: 0.61 mg/dL (ref 0.44–1.00)
GFR calc Af Amer: >60 mL/min (ref >60)
GFR calc non Af Amer: >60 mL/min (ref >60)
Glucose, Bld: 70 mg/dL (ref 70–99)
Potassium: 4.2 mmol/L (ref 3.5–5.1)
Sodium: 137 mmol/L (ref 135–145)
Total Bilirubin: 1 mg/dL (ref 0.3–1.2)
Total Protein: 6.6 g/dL (ref 6.5–8.1)

## 2020-05-16 LAB — CBC WITH DIFFERENTIAL/PLATELET
Abs Immature Granulocytes: 0.03 10*3/uL (ref 0.00–0.07)
Basophils Absolute: 0 10*3/uL (ref 0.0–0.1)
Basophils Relative: 0 %
Eosinophils Absolute: 0 10*3/uL (ref 0.0–0.5)
Eosinophils Relative: 0 %
HCT: 35.3 % — ABNORMAL LOW (ref 36.0–46.0)
Hemoglobin: 10.5 g/dL — ABNORMAL LOW (ref 12.0–15.0)
Immature Granulocytes: 1 %
Lymphocytes Relative: 19 %
Lymphs Abs: 1.1 10*3/uL (ref 0.7–4.0)
MCH: 35.7 pg — ABNORMAL HIGH (ref 26.0–34.0)
MCHC: 29.7 g/dL — ABNORMAL LOW (ref 30.0–36.0)
MCV: 120.1 fL — ABNORMAL HIGH (ref 80.0–100.0)
Monocytes Absolute: 0.9 10*3/uL (ref 0.1–1.0)
Monocytes Relative: 16 %
Neutro Abs: 3.8 10*3/uL (ref 1.7–7.7)
Neutrophils Relative %: 64 %
Platelets: 180 10*3/uL (ref 150–400)
RBC: 2.94 MIL/uL — ABNORMAL LOW (ref 3.87–5.11)
RDW: 14.6 % (ref 11.5–15.5)
WBC: 5.8 10*3/uL (ref 4.0–10.5)
nRBC: 0.3 % — ABNORMAL HIGH (ref 0.0–0.2)

## 2020-05-16 LAB — PHOSPHORUS: Phosphorus: 2.3 mg/dL — ABNORMAL LOW (ref 2.5–4.6)

## 2020-05-16 LAB — VITAMIN B12: Vitamin B-12: 1208 pg/mL — ABNORMAL HIGH (ref 180–914)

## 2020-05-16 LAB — D-DIMER, QUANTITATIVE: D-Dimer, Quant: 0.71 ug/mL-FEU — ABNORMAL HIGH (ref 0.00–0.50)

## 2020-05-16 LAB — C-REACTIVE PROTEIN: CRP: 9.4 mg/dL — ABNORMAL HIGH (ref <1.0)

## 2020-05-16 LAB — AMMONIA: Ammonia: 19 umol/L (ref 9–35)

## 2020-05-16 LAB — TSH: TSH: 0.885 u[IU]/mL (ref 0.350–4.500)

## 2020-05-16 LAB — MAGNESIUM: Magnesium: 2.4 mg/dL (ref 1.7–2.4)

## 2020-05-16 LAB — T4, FREE: Free T4: 1.52 ng/dL — ABNORMAL HIGH (ref 0.61–1.12)

## 2020-05-16 MED ORDER — HALOPERIDOL LACTATE 5 MG/ML IJ SOLN
2.0000 mg | Freq: Four times a day (QID) | INTRAMUSCULAR | Status: DC | PRN
  Administered 2020-05-16 – 2020-05-17 (×4): 2 mg via INTRAVENOUS
  Filled 2020-05-16 (×4): qty 1

## 2020-05-16 MED ORDER — GABAPENTIN 100 MG PO CAPS
100.0000 mg | ORAL_CAPSULE | Freq: Three times a day (TID) | ORAL | Status: DC
  Administered 2020-05-16 – 2020-05-22 (×17): 100 mg via ORAL
  Filled 2020-05-16 (×17): qty 1

## 2020-05-16 MED ORDER — QUETIAPINE FUMARATE 25 MG PO TABS
25.0000 mg | ORAL_TABLET | Freq: Every day | ORAL | Status: DC
  Administered 2020-05-16 – 2020-05-21 (×6): 25 mg via ORAL
  Filled 2020-05-16 (×6): qty 1

## 2020-05-16 NOTE — Plan of Care (Signed)
Plan of care initiated and discussed with the patient. 

## 2020-05-16 NOTE — Progress Notes (Signed)
[facility] Progress Note  Patient: Brandy Gutierrez    JSR:[id number]  DOA: 05/14/2020     Date of Service: the patient was seen and examined on 05/16/2020  Brief hospital course: Past medical history of COPD, menorrhagia, GERD, HTN, seizure disorder, recurrent TIAs, B12 deficiency, cervical cancer.  Presents with complaints of shortness of breath found to have COVID-19 pneumonia with acute hypoxic respiratory failure. Currently plan is continue further work-up for encephalopathy as well as treatment for COVID-19 pneumonia with COPD exacerbation..  Assessment and Plan: 1. Acute COVID-19 Viral Pneumonia Acute hypoxic respiratory failure pretension acute exacerbation of COPD CXR: hazy bilateral peripheral opacities Oxygen requirement: On 2 LPM.  88% on room air. CRP: 11.6 Remdesivir: Started on 05/14/2020 Steroids: Decadron started on 05/14/2020 Baricitinib/Actemra(off-label use): Currently no indication for now Antibiotics: Currently not indicated Vitamin C and Zinc: Continue Continue inhalers for COPD exacerbation Prone positioning and incentive spirometer use recommended.  The treatment plan and use of medications and known side effects were discussed with patient/family. It was clearly explained that Complete risks and long-term side effects are unknown, however in the best clinical judgment they seem to be of some clinical benefit rather than medical risks. Patient/family agree with the treatment plan and want to receive these treatments as indicated.   2.  Fibromyalgia, depression. Holding amitriptyline, gabapentin, Norco, Remeron.  Given her encephalopathy  3.  History of seizure disorders Currently no active seizures. Still remains somewhat confused on examination although oriented when questions are asked. No focal deficit. Continue Lamictal 50 mg twice daily as well as Keppra 500 mg twice daily. Reducing gabapentin dose from 600 mg 3 times a day 200 mg 3 times a  day.  4.  GERD Continue PPI.  5.  Essential tremors Continue Inderal.  6.  Hyperlipidemia Continue Crestor  7.  Underweight Body mass index is 16.97 kg/m.  Will consult dietary.  May help her to develop malnutrition.  8.  Agitation. Acute metabolic encephalopathy. Patient appears to be acutely confused right now. Discussed with husband at her baseline patient is clear.  This is also evident in her recent visit in outpatient clinic notes. We will check CT head and EEG. Metabolic work-up so far is unremarkable other than hypoxia. Also suspect a large role of polypharmacy here therefore we will be making some changes in her medications. As needed Haldol.  Scheduled Seroquel nightly. Currently based on evaluation on 05/16/2020, does not appear to have any insight in her medical condition based on her agitation and confusion and therefore cannot leave AMA.  Diet: Regular diet DVT Prophylaxis:   enoxaparin (LOVENOX) injection 40 mg Start: 05/14/20 2200    Advance goals of care discussion: Full code  Family Communication: no family was present at bedside, at the time of interview.  Discussed with husband on the phone. Opportunity was given to ask question and all questions were answered satisfactorily.   Disposition:  Status is: Inpatient  Remains inpatient appropriate because:Altered mental status patient cannot leave AMA as she does not have any insight in her medical condition as of today's evaluation on 05/16/2020.  Dispo: The patient is from: Home              Anticipated d/c is to: Home              Anticipated d/c date is: > 3 days              Patient currently is not medically stable to d/c.  Subjective:  Continues to have shortness of breath.  Dry cough.  No nausea no vomiting.  Minimal oral intake.  Refused home medication at this morning.  Remains confused throughout the day.  Hallucinating.  Physical Exam:  General: Appear in moderate distress, no Rash; Oral  Mucosa Clear, dry. no Abnormal Neck Mass Or lumps, Conjunctiva normal  Cardiovascular: S1 and S2 Present, no Murmur, Respiratory: increased respiratory effort, Bilateral Air entry present and bilateral  Crackles, no wheezes Abdomen: Bowel Sound present, Soft and no tenderness Extremities: no Pedal edema Neurology: Confused but alert and not oriented to time, place, and person affect inappropriate. no new focal deficit Gait not checked due to patient safety concerns  Vitals:   05/16/20 0951 05/16/20 1100 05/16/20 1322 05/16/20 2025  BP: (!) 131/103  135/79 (!) 147/88  Pulse: 93  87 90  Resp: 18  20 15   Temp: 98.6 F (37 C)  98.3 F (36.8 C) 99 F (37.2 C)  TempSrc: Oral  Oral   SpO2: (!) 74% 94% 95% 91%  Weight:      Height:        Intake/Output Summary (Last 24 hours) at 05/16/2020 2052 Last data filed at 05/16/2020 1845 Gross per 24 hour  Intake 240 ml  Output --  Net 240 ml   Filed Weights   05/14/20 1134  Weight: 46.3 kg    Data Reviewed: I have personally reviewed and interpreted daily labs, tele strips, imagings as discussed above. I reviewed all nursing notes, pharmacy notes, vitals, pertinent old records I have discussed plan of care as described above with RN and patient/family.  CBC: Recent Labs  Lab 05/14/20 1331 05/15/20 0435 05/16/20 0343  WBC 6.4 5.2 5.8  NEUTROABS 4.9 3.6 3.8  HGB 10.5* 9.9* 10.5*  HCT 33.9* 31.1* 35.3*  MCV 112.6* 110.3* 120.1*  PLT 163 160 664   Basic Metabolic Panel: Recent Labs  Lab 05/14/20 1331 05/15/20 0435 05/16/20 0343  NA 134* 136 137  K 4.0 3.8 4.2  CL 95* 94* 96*  CO2 29 28 26   GLUCOSE 102* 90 70  BUN 10 10 12   CREATININE 0.64 0.59 0.61  CALCIUM 9.5 8.7* 8.5*  MG  --  1.6* 2.4  PHOS  --  3.0 2.3*    Studies: DG CHEST PORT 1 VIEW  Result Date: 05/16/2020 CLINICAL DATA:  Confusion.  COVID positive. EXAM: PORTABLE CHEST 1 VIEW COMPARISON:  05/14/2020. FINDINGS: Mediastinum and hilar structures normal.  Heart size normal. No focal infiltrate. No pleural effusion or pneumothorax. IMPRESSION: No acute cardiopulmonary disease. Electronically Signed   By: Marcello Moores  Register   On: 05/16/2020 11:52    Scheduled Meds:  vitamin C  500 mg Oral Daily   aspirin EC  81 mg Oral QHS   clopidogrel  75 mg Oral QODAY   enoxaparin (LOVENOX) injection  40 mg Subcutaneous Q03K   folic acid  1 mg Oral Daily   gabapentin  100 mg Oral TID   Ipratropium-Albuterol  1 puff Inhalation Q6H   lamoTRIgine  50 mg Oral BID   levETIRAcetam  500 mg Oral BID   pantoprazole  40 mg Oral BID   propranolol  20 mg Oral BID   QUEtiapine  25 mg Oral QHS   rosuvastatin  20 mg Oral QHS   zinc sulfate  220 mg Oral Daily   Continuous Infusions:  remdesivir 100 mg in NS 100 mL 100 mg (05/16/20 1026)   PRN Meds: acetaminophen, albuterol, guaiFENesin-dextromethorphan, haloperidol lactate, ondansetron **OR**  ondansetron (ZOFRAN) IV  Time spent: 35 minutes  Author: Berle Mull, MD [facility] 05/16/2020 8:52 PM  To reach On-call, see care teams to locate the attending and reach out via [email or web address]. Between 7PM-7AM, please contact night-coverage If you still have difficulty reaching the attending provider, please page the [facility] (Director on Call) for [facility] on amion for assistance.

## 2020-05-16 NOTE — Progress Notes (Signed)
Patient noted to be very confused most of shift.  Discussed this concern with MD and husband.  Refused all morning medications, MD aware.  Patient noted to be very agitated around 1830, She stated that she was going home.   MD notified.  PRN ordered. Patient back in bed with oxygen on.  Oncoming nurse made aware.  Virginia Rochester, RN

## 2020-05-17 ENCOUNTER — Inpatient Hospital Stay (HOSPITAL_COMMUNITY)
Admit: 2020-05-17 | Discharge: 2020-05-17 | Disposition: A | Discharge status: Home / Self Care | Payer: Medicare Other | Attending: Internal Medicine | Admitting: Internal Medicine

## 2020-05-17 ENCOUNTER — Inpatient Hospital Stay (HOSPITAL_COMMUNITY): Payer: Medicare Other

## 2020-05-17 DIAGNOSIS — G40909 Epilepsy, unspecified, not intractable, without status epilepticus: Secondary | ICD-10-CM

## 2020-05-17 DIAGNOSIS — J449 Chronic obstructive pulmonary disease, unspecified: Secondary | ICD-10-CM | POA: Diagnosis present

## 2020-05-17 DIAGNOSIS — R0602 Shortness of breath: Secondary | ICD-10-CM

## 2020-05-17 DIAGNOSIS — J9601 Acute respiratory failure with hypoxia: Secondary | ICD-10-CM

## 2020-05-17 DIAGNOSIS — J1282 Pneumonia due to coronavirus disease 2019: Secondary | ICD-10-CM

## 2020-05-17 DIAGNOSIS — E876 Hypokalemia: Secondary | ICD-10-CM | POA: Diagnosis present

## 2020-05-17 DIAGNOSIS — E7800 Pure hypercholesterolemia, unspecified: Secondary | ICD-10-CM

## 2020-05-17 DIAGNOSIS — U071 COVID-19: Secondary | ICD-10-CM | POA: Diagnosis present

## 2020-05-17 DIAGNOSIS — M797 Fibromyalgia: Secondary | ICD-10-CM

## 2020-05-17 DIAGNOSIS — E785 Hyperlipidemia, unspecified: Secondary | ICD-10-CM | POA: Diagnosis present

## 2020-05-17 LAB — CBC WITH DIFFERENTIAL/PLATELET
Abs Immature Granulocytes: 0.02 10*3/uL (ref 0.00–0.07)
Basophils Absolute: 0 10*3/uL (ref 0.0–0.1)
Basophils Relative: 1 %
Eosinophils Absolute: 0 10*3/uL (ref 0.0–0.5)
Eosinophils Relative: 0 %
HCT: 34.6 % — ABNORMAL LOW (ref 36.0–46.0)
Hemoglobin: 10.7 g/dL — ABNORMAL LOW (ref 12.0–15.0)
Immature Granulocytes: 0 %
Lymphocytes Relative: 18 %
Lymphs Abs: 1.1 10*3/uL (ref 0.7–4.0)
MCH: 34.4 pg — ABNORMAL HIGH (ref 26.0–34.0)
MCHC: 30.9 g/dL (ref 30.0–36.0)
MCV: 111.3 fL — ABNORMAL HIGH (ref 80.0–100.0)
Monocytes Absolute: 1 10*3/uL (ref 0.1–1.0)
Monocytes Relative: 16 %
Neutro Abs: 4 10*3/uL (ref 1.7–7.7)
Neutrophils Relative %: 65 %
Platelets: 225 10*3/uL (ref 150–400)
RBC: 3.11 MIL/uL — ABNORMAL LOW (ref 3.87–5.11)
RDW: 14.3 % (ref 11.5–15.5)
WBC: 6.1 10*3/uL (ref 4.0–10.5)
nRBC: 0 % (ref 0.0–0.2)

## 2020-05-17 LAB — COMPREHENSIVE METABOLIC PANEL
ALT: 21 U/L (ref 0–44)
AST: 34 U/L (ref 15–41)
Albumin: 3.1 g/dL — ABNORMAL LOW (ref 3.5–5.0)
Alkaline Phosphatase: 85 U/L (ref 38–126)
Anion gap: 17 — ABNORMAL HIGH (ref 5–15)
BUN: 10 mg/dL (ref 6–20)
CO2: 28 mmol/L (ref 22–32)
Calcium: 8.9 mg/dL (ref 8.9–10.3)
Chloride: 92 mmol/L — ABNORMAL LOW (ref 98–111)
Creatinine, Ser: 0.57 mg/dL (ref 0.44–1.00)
GFR calc Af Amer: >60 mL/min (ref >60)
GFR calc non Af Amer: >60 mL/min (ref >60)
Glucose, Bld: 84 mg/dL (ref 70–99)
Potassium: 3.2 mmol/L — ABNORMAL LOW (ref 3.5–5.1)
Sodium: 137 mmol/L (ref 135–145)
Total Bilirubin: 0.8 mg/dL (ref 0.3–1.2)
Total Protein: 6.6 g/dL (ref 6.5–8.1)

## 2020-05-17 LAB — D-DIMER, QUANTITATIVE: D-Dimer, Quant: 0.62 ug/mL-FEU — ABNORMAL HIGH (ref 0.00–0.50)

## 2020-05-17 LAB — FERRITIN: Ferritin: 102 ng/mL (ref 11–307)

## 2020-05-17 LAB — MAGNESIUM: Magnesium: 1.9 mg/dL (ref 1.7–2.4)

## 2020-05-17 LAB — PHOSPHORUS: Phosphorus: 1.5 mg/dL — ABNORMAL LOW (ref 2.5–4.6)

## 2020-05-17 LAB — C-REACTIVE PROTEIN: CRP: 13.4 mg/dL — ABNORMAL HIGH (ref <1.0)

## 2020-05-17 LAB — LACTATE DEHYDROGENASE: LDH: 248 U/L — ABNORMAL HIGH (ref 98–192)

## 2020-05-17 MED ORDER — ALBUTEROL SULFATE HFA 108 (90 BASE) MCG/ACT IN AERS: 2.0000 | INHALATION_SPRAY | RESPIRATORY_TRACT | Status: DC | PRN

## 2020-05-17 MED ORDER — METHYLPREDNISOLONE SODIUM SUCC 40 MG IJ SOLR
40.0000 mg | Freq: Two times a day (BID) | INTRAMUSCULAR | Status: DC
  Administered 2020-05-17 – 2020-05-22 (×10): 40 mg via INTRAVENOUS
  Filled 2020-05-17 (×10): qty 1

## 2020-05-17 MED ORDER — POTASSIUM PHOSPHATES 15 MMOLE/5ML IV SOLN
40.0000 mmol | Freq: Once | INTRAVENOUS | Status: AC
  Administered 2020-05-17: 40 mmol via INTRAVENOUS
  Filled 2020-05-17 (×2): qty 13.33

## 2020-05-17 MED ORDER — IPRATROPIUM-ALBUTEROL 20-100 MCG/ACT IN AERS
1.0000 | INHALATION_SPRAY | Freq: Four times a day (QID) | RESPIRATORY_TRACT | Status: DC
  Administered 2020-05-17 – 2020-05-21 (×14): 1 via RESPIRATORY_TRACT
  Filled 2020-05-17: qty 4

## 2020-05-17 MED ORDER — IPRATROPIUM-ALBUTEROL 20-100 MCG/ACT IN AERS
1.0000 | INHALATION_SPRAY | Freq: Two times a day (BID) | RESPIRATORY_TRACT | Status: DC
  Administered 2020-05-17: 1 via RESPIRATORY_TRACT

## 2020-05-17 NOTE — Progress Notes (Signed)
PROGRESS NOTE    MONTSERRATH Gutierrez  QIH:[id number] DOB: March 21, 1961 DOA: 05/14/2020 PCP: Neale Burly, MD     Brief Narrative:  Brandy Gutierrez is a 59 y.o. WF PMHx Seizures, Memory DO, TIA x6, gait abnormality, anterior cerebral aneurysm, COPD, asthma due to seasonal allergies, fibromyalgia, HTN,.   Presenting with 5 days of dyspnea. She tested positive for COVID with a home COVID kit. 4 days ago per her report. She states that she has only try tylenol for relief since the beginning of her symptoms. It has provided no relief. She became concerned when her home pulse oximeter began registering values in the 80's. So she came to the ED. She denies any other alleviating or aggravating factors.    ED Course: Found to be COVID positive. Found to be hypoxic on RA. Started on remdes and decadron. TRH called for admission.   Review of Systems:  She reports HA and sore throat. She denies chest pain, palpitations, fever. Review of systems is otherwise negative for all not mentioned in HPI.     Subjective: Afebrile overnight   Assessment & Plan: Covid vaccination;   Active Problems:   Fibromyalgia   Memory disorder   Anterior cerebral aneurysm   Seizure disorder ([place])   COVID-19   COPD mixed type ([place])   Acute respiratory failure with hypoxia ([facility])   Pneumonia due to COVID-19 virus   HLD (hyperlipidemia)   Hypokalemia   Hypophosphatemia   Acute Respiratory Failure with Hypoxia/ COVID 19 Pneumonia  COVID-19 Labs  Recent Labs    05/15/20 0435 05/16/20 0343 05/17/20 0332 05/17/20 0900  DDIMER 0.70* 0.71* 0.62*  --   FERRITIN  --   --   --  102  LDH  --   --   --  248*  CRP  --  9.4* 13.4*  --     Lab Results  Component Value Date   SARSCOV2NAA POSITIVE (A) 05/14/2020  -Patient newly hypoxic, with elevated CRP> 7, on 3 L O2 (new). -Solu-Medrol 40 mg BID -Remdesivir x5 days per pharmacy protocol -Respimat QID -Vitamin C and zinc per Covid protocol -Flutter  valve -Incentive spirometry -Prone patient 8 hours per day; if patient cannot tolerate prone 2 to 3 hours per shift -Titrate O2 to maintain SPO2> 88%  Seizure DO -Lamictal 50 mg BID -Keppra 500 mg BID  HLD -Lipid panel pending  Hypokalemia --Potassium goal>4 --K-Phos 29mol  Hypophosphatemia --Phosphorous goal> 2.5 --See HypoKalemia   DVT prophylaxis: Lovenox Code Status: Full  family Communication:  Status is: Inpatient    Dispo: The patient is from: Home              Anticipated d/c is to: SNF?              Anticipated d/c date is: 10/5              Patient currently       Consultants:    Procedures/Significant Events:    I have personally reviewed and interpreted all radiology studies and my findings are as above.  VENTILATOR SETTINGS: Nasal Cannula 9/29 Flow; 3L/min SPO2; 92%   Cultures   Antimicrobials: Anti-infectives (From admission, onward)   Start     Ordered Stop   05/15/20 1000  remdesivir 100 mg in sodium chloride 0.9 % 100 mL IVPB  Status:  Discontinued       "Followed by" Linked Group Details   05/14/20 1811 05/14/20 1812   05/15/20 1000  remdesivir  100 mg in sodium chloride 0.9 % 100 mL IVPB       "Followed by" Linked Group Details   05/14/20 1734 05/19/20 0959   05/14/20 1811  remdesivir 200 mg in sodium chloride 0.9% 250 mL IVPB  Status:  Discontinued       "Followed by" Linked Group Details   05/14/20 1811 05/14/20 1812   05/14/20 1745  remdesivir 200 mg in sodium chloride 0.9% 250 mL IVPB       "Followed by" Linked Group Details   05/14/20 1734 05/14/20 2029       Devices    LINES / TUBES:      Continuous Infusions: . potassium PHOSPHATE IVPB (in mmol) 40 mmol (05/17/20 1633)  . remdesivir 100 mg in NS 100 mL 100 mg (05/17/20 1001)     Objective: Vitals:   05/16/20 1322 05/16/20 2025 05/17/20 0402 05/17/20 1343  BP: 135/79 (!) 147/88 (!) 146/92 107/78  Pulse: 87 90 97 82  Resp: '20 15 15 16  ' Temp: 98.3 F  (36.8 C) 99 F (37.2 C) 98.6 F (37 C) 98.1 F (36.7 C)  TempSrc: Oral Oral Oral Oral  SpO2: 95% 91% 92% 96%  Weight:      Height:        Intake/Output Summary (Last 24 hours) at 05/17/2020 1928 Last data filed at 05/17/2020 1901 Gross per 24 hour  Intake 433.79 ml  Output --  Net 433.79 ml   Filed Weights   05/14/20 1134  Weight: 46.3 kg    Examination:  General: A/O x4, positive acute respiratory distress, cachectic Eyes: negative scleral hemorrhage, negative anisocoria, negative icterus,  ENT: Negative Runny nose, negative gingival bleeding, Neck:  Negative scars, masses, torticollis, lymphadenopathy, JVD Lungs: decreased breath sounds bilaterally without wheezes, positive or crackles Cardiovascular: Regular rate and rhythm without murmur gallop or rub normal S1 and S2 Abdomen: negative abdominal pain, nondistended, positive soft, bowel sounds, no rebound, no ascites, no appreciable mass Extremities: No significant cyanosis, clubbing, or edema bilateral lower extremities Skin: Negative rashes, lesions, ulcers Psychiatric:  Negative depression, negative anxiety, negative fatigue, negative mania  Central nervous system:  Cranial nerves II through XII intact, tongue/uvula midline, all extremities muscle strength 5/5, sensation intact throughout, negative dysarthria, negative expressive aphasia, negative receptive aphasia.  .     Data Reviewed: Care during the described time interval was provided by me .  I have reviewed this patient's available data, including medical history, events of note, physical examination, and all test results as part of my evaluation.  CBC: Recent Labs  Lab 05/14/20 1331 05/15/20 0435 05/16/20 0343 05/17/20 0332  WBC 6.4 5.2 5.8 6.1  NEUTROABS 4.9 3.6 3.8 4.0  HGB 10.5* 9.9* 10.5* 10.7*  HCT 33.9* 31.1* 35.3* 34.6*  MCV 112.6* 110.3* 120.1* 111.3*  PLT 163 160 180 361   Basic Metabolic Panel: Recent Labs  Lab 05/14/20 1331  05/15/20 0435 05/16/20 0343 05/17/20 0332 05/17/20 0900  NA 134* 136 137 137  --   K 4.0 3.8 4.2 3.2*  --   CL 95* 94* 96* 92*  --   CO2 '29 28 26 28  ' --   GLUCOSE 102* 90 70 84  --   BUN '10 10 12 10  ' --   CREATININE 0.64 0.59 0.61 0.57  --   CALCIUM 9.5 8.7* 8.5* 8.9  --   MG  --  1.6* 2.4 1.9  --   PHOS  --  3.0 2.3*  --  1.5*  GFR: Estimated Creatinine Clearance: 56 mL/min (by C-G formula based on SCr of 0.57 mg/dL). Liver Function Tests: Recent Labs  Lab 05/14/20 1331 05/15/20 0435 05/16/20 0343 05/17/20 0332  AST 56* 44* 45* 34  ALT '30 25 22 21  ' ALKPHOS 91 73 76 85  BILITOT 0.4 0.4 1.0 0.8  PROT 7.4 6.3* 6.6 6.6  ALBUMIN 3.4* 2.8* 3.0* 3.1*   No results for input(s): LIPASE, AMYLASE in the last 168 hours. Recent Labs  Lab 05/16/20 1236  AMMONIA 19   Coagulation Profile: No results for input(s): INR, PROTIME in the last 168 hours. Cardiac Enzymes: No results for input(s): CKTOTAL, CKMB, CKMBINDEX, TROPONINI in the last 168 hours. BNP (last 3 results) No results for input(s): PROBNP in the last 8760 hours. HbA1C: No results for input(s): HGBA1C in the last 72 hours. CBG: No results for input(s): GLUCAP in the last 168 hours. Lipid Profile: No results for input(s): CHOL, HDL, LDLCALC, TRIG, CHOLHDL, LDLDIRECT in the last 72 hours. Thyroid Function Tests: Recent Labs    05/16/20 1236  TSH 0.885  FREET4 1.52*   Anemia Panel: Recent Labs    05/16/20 1236 05/17/20 0900  VITAMINB12 1,208*  --   FERRITIN  --  102   Sepsis Labs: Recent Labs  Lab 05/14/20 1331  PROCALCITON 1.58  LATICACIDVEN 0.9    Recent Results (from the past 240 hour(s))  Blood Culture (routine x 2)     Status: None (Preliminary result)   Collection Time: 05/14/20  1:29 PM   Specimen: Left Antecubital; Blood  Result Value Ref Range Status   Specimen Description   Final    LEFT ANTECUBITAL Performed at [facility], [place] [address] [place], [place]  [address]    Special Requests   Final    BOTTLES DRAWN AEROBIC AND ANAEROBIC Blood Culture adequate volume Performed at [place] [address] [place], [place] [address]    Culture   Final    NO GROWTH 3 DAYS Performed at [facility], [place] [address] [place], [place] [address]    Report Status PENDING  Incomplete  Blood Culture (routine x 2)     Status: None (Preliminary result)   Collection Time: 05/14/20  1:31 PM   Specimen: Right Antecubital; Blood  Result Value Ref Range Status   Specimen Description   Final    RIGHT ANTECUBITAL Performed at [place] [address] [place], [place] [address]    Special Requests   Final    BOTTLES DRAWN AEROBIC AND ANAEROBIC Blood Culture adequate volume Performed at [place] [address] [place], [place] [address]    Culture   Final    NO GROWTH 3 DAYS Performed at [facility], [place] [address] [place], [place] [address]    Report Status PENDING  Incomplete  Resp Panel by RT PCR (RSV, Flu A&B, Covid) - Nasopharyngeal Swab     Status: Abnormal   Collection Time: 05/14/20  1:31 PM   Specimen: Nasopharyngeal Swab  Result Value Ref Range Status   SARS Coronavirus 2 by RT PCR POSITIVE (A) NEGATIVE Final    Comment: RESULT CALLED TO, READ BACK BY AND VERIFIED WITH: WILLIAM,F RN '@[id number]'  ON 05/14/20 JACKSON,K (NOTE) SARS-CoV-2 target nucleic acids are DETECTED.  SARS-CoV-2 RNA is generally detectable in upper respiratory specimens  during the acute phase of infection. Positive results are indicative of the presence of the identified virus, but do not  rule out bacterial infection or co-infection with other pathogens not detected by the test. Clinical correlation with patient history and other diagnostic information is necessary to determine patient infection status. The expected result is Negative.  Fact Sheet for Patients:   PinkCheek.be  Fact Sheet for Healthcare Providers: GravelBags.it  This test is not yet approved or cleared by the [place] FDA and  has been authorized for detection and/or diagnosis of SARS-CoV-2 by FDA under an Emergency Use Authorization (EUA).  This EUA will remain in effect (meaning this test ca n be used) for the duration of  the COVID-19 declaration under Section 564(b)(1) of the Act, 21 U.S.C. section 360bbb-3(b)(1), unless the authorization is terminated or revoked sooner.      Influenza A by PCR NEGATIVE NEGATIVE Final   Influenza B by PCR NEGATIVE NEGATIVE Final    Comment: (NOTE) The Xpert Xpress SARS-CoV-2/FLU/RSV assay is intended as an aid in  the diagnosis of influenza from Nasopharyngeal swab specimens and  should not be used as a sole basis for treatment. Nasal washings and  aspirates are unacceptable for Xpert Xpress SARS-CoV-2/FLU/RSV  testing.  Fact Sheet for Patients: PinkCheek.be  Fact Sheet for Healthcare Providers: GravelBags.it  This test is not yet approved or cleared by the [place] FDA and  has been authorized for detection and/or diagnosis of SARS-CoV-2 by  FDA under an Emergency Use Authorization (EUA). This EUA will remain  in effect (meaning this test can be used) for the duration of the  Covid-19 declaration under Section 564(b)(1) of the Act, 21  U.S.C. section 360bbb-3(b)(1), unless the authorization is  terminated or revoked.    Respiratory Syncytial Virus by PCR NEGATIVE NEGATIVE Final    Comment: (NOTE) Fact Sheet for Patients: PinkCheek.be  Fact Sheet for Healthcare Providers: GravelBags.it  This test is not yet approved or cleared by the [place] FDA and  has been authorized for detection and/or diagnosis of SARS-CoV-2 by  FDA under an  Emergency Use Authorization (EUA). This EUA will remain  in effect (meaning this test can be used) for the duration of the  COVID-19 declaration under Section 564(b)(1) of the Act, 21 U.S.C.  section 360bbb-3(b)(1), unless the authorization is terminated or  revoked. Performed at [facility], [place] [address], [place], [place] [address]          Radiology Studies: CT HEAD WO CONTRAST  Result Date: 05/17/2020 CLINICAL DATA:  Covid + Delirium. Unable to get hx from pt. EXAM: CT HEAD WITHOUT CONTRAST TECHNIQUE: Contiguous axial images were obtained from the base of the skull through the vertex without intravenous contrast. COMPARISON:  MR head 12/20/2019, CT angio head 07/10/18 report without images, CT head 06/26/2019 FINDINGS: Brain: Cerebral ventricle sizes are concordant with the degree of cerebral volume loss. No evidence of large-territorial acute infarction. No parenchymal hemorrhage. No mass lesion. No extra-axial collection. No mass effect or midline shift. No hydrocephalus. Basilar cisterns are patent. Vascular: Anterior communicating artery aneurysmal clip noted. Skull: Negative for fracture or focal lesion. Sinuses/Orbits: Paranasal sinuses and mastoid air cells are clear. The orbits are unremarkable. Other: None. IMPRESSION: No acute intracranial abnormality. Electronically Signed   By: Iven Finn M.D.   On: 05/17/2020 17:26   DG CHEST PORT 1 VIEW  Result Date: 05/16/2020 CLINICAL DATA:  Confusion.  COVID positive. EXAM: PORTABLE CHEST 1 VIEW COMPARISON:  05/14/2020. FINDINGS: Mediastinum and hilar structures normal. Heart size normal. No focal infiltrate. No pleural effusion or pneumothorax. IMPRESSION: No  acute cardiopulmonary disease. Electronically Signed   By: Marcello Moores  Register   On: 05/16/2020 11:52   EEG adult  Result Date: 05/17/2020 Lora Havens, MD     05/17/2020  9:32 AM Patient Name: JILLANN CHARETTE MRN: [id number] Epilepsy Attending: Lora Havens Referring Physician/Provider: Dr. Berle Mull Date: 05/18/2019 Duration: 22.26 minutes Patient history: 59 year old female with history of epilepsy was admitted for COVID-19 pneumonia.  EEG to evaluate for seizures. Level of alertness: Awake AEDs during EEG study: Gabapentin, lamotrigine, Keppra Technical aspects: This EEG study was done with scalp electrodes positioned according to the 10-20 International system of electrode placement. Electrical activity was acquired at a sampling rate of '500Hz'  and reviewed with a high frequency filter of '70Hz'  and a low frequency filter of '1Hz' . EEG data were recorded continuously and digitally stored. Description:   No posterior dominant rhythm was seen.  EEG showed continuous generalized 3 to 6 Hz theta-delta slowing.  Hyperventilation and photic stimulation were not performed.   Off note, study was technically difficult due to significant electrode and myogenic artifact. ABNORMALITY -Continuous slow, generalized IMPRESSION: This technically difficult study is suggestive of moderate diffuse encephalopathy, nonspecific to etiology. No seizures or epileptiform discharges were seen throughout the recording. Priyanka Barbra Sarks        Scheduled Meds: . vitamin C  500 mg Oral Daily  . aspirin EC  81 mg Oral QHS  . clopidogrel  75 mg Oral QODAY  . enoxaparin (LOVENOX) injection  40 mg Subcutaneous Q24H  . folic acid  1 mg Oral Daily  . gabapentin  100 mg Oral TID  . Ipratropium-Albuterol  1 puff Inhalation Q6H  . lamoTRIgine  50 mg Oral BID  . levETIRAcetam  500 mg Oral BID  . methylPREDNISolone (SOLU-MEDROL) injection  40 mg Intravenous Q12H  . pantoprazole  40 mg Oral BID  . propranolol  20 mg Oral BID  . QUEtiapine  25 mg Oral QHS  . rosuvastatin  20 mg Oral QHS  . zinc sulfate  220 mg Oral Daily   Continuous Infusions: . potassium PHOSPHATE IVPB (in mmol) 40 mmol (05/17/20 1633)  . remdesivir 100 mg in NS 100 mL 100 mg (05/17/20 1001)     LOS: 3  days    Time spent:40 min    Chiffon Kittleson, Geraldo Docker, MD [facility] Pager [phone]  If 7PM-7AM, please contact night-coverage [email or web address] Password [facility] 05/17/2020, 7:28 PM

## 2020-05-17 NOTE — Progress Notes (Signed)
EEG complete - results pending 

## 2020-05-17 NOTE — Procedures (Signed)
Patient Name: Brandy Gutierrez  MRN: [id number]  Epilepsy Attending: Lora Havens  Referring Physician/Provider: Dr. Berle Mull Date: 05/18/2019 Duration: 22.26 minutes  Patient history: 59 year old female with history of epilepsy was admitted for COVID-19 pneumonia.  EEG to evaluate for seizures.  Level of alertness: Awake  AEDs during EEG study: Gabapentin, lamotrigine, Keppra  Technical aspects: This EEG study was done with scalp electrodes positioned according to the 10-20 International system of electrode placement. Electrical activity was acquired at a sampling rate of 500Hz  and reviewed with a high frequency filter of 70Hz  and a low frequency filter of 1Hz . EEG data were recorded continuously and digitally stored.   Description:   No posterior dominant rhythm was seen.  EEG showed continuous generalized 3 to 6 Hz theta-delta slowing.  Hyperventilation and photic stimulation were not performed.     Off note, study was technically difficult due to significant electrode and myogenic artifact.  ABNORMALITY -Continuous slow, generalized   IMPRESSION: This technically difficult study is suggestive of moderate diffuse encephalopathy, nonspecific to etiology. No seizures or epileptiform discharges were seen throughout the recording.  Kenslee Achorn Barbra Sarks

## 2020-05-18 LAB — COMPREHENSIVE METABOLIC PANEL
ALT: 20 U/L (ref 0–44)
AST: 32 U/L (ref 15–41)
Albumin: 2.9 g/dL — ABNORMAL LOW (ref 3.5–5.0)
Alkaline Phosphatase: 82 U/L (ref 38–126)
Anion gap: 14 (ref 5–15)
BUN: 9 mg/dL (ref 6–20)
CO2: 31 mmol/L (ref 22–32)
Calcium: 8.5 mg/dL — ABNORMAL LOW (ref 8.9–10.3)
Chloride: 91 mmol/L — ABNORMAL LOW (ref 98–111)
Creatinine, Ser: 0.58 mg/dL (ref 0.44–1.00)
GFR calc Af Amer: >60 mL/min (ref >60)
GFR calc non Af Amer: >60 mL/min (ref >60)
Glucose, Bld: 136 mg/dL — ABNORMAL HIGH (ref 70–99)
Potassium: 3.1 mmol/L — ABNORMAL LOW (ref 3.5–5.1)
Sodium: 136 mmol/L (ref 135–145)
Total Bilirubin: 0.8 mg/dL (ref 0.3–1.2)
Total Protein: 6.7 g/dL (ref 6.5–8.1)

## 2020-05-18 LAB — LACTATE DEHYDROGENASE: LDH: 236 U/L — ABNORMAL HIGH (ref 98–192)

## 2020-05-18 LAB — CBC WITH DIFFERENTIAL/PLATELET
Abs Immature Granulocytes: 0.01 10*3/uL (ref 0.00–0.07)
Basophils Absolute: 0 10*3/uL (ref 0.0–0.1)
Basophils Relative: 0 %
Eosinophils Absolute: 0 10*3/uL (ref 0.0–0.5)
Eosinophils Relative: 0 %
HCT: 32.3 % — ABNORMAL LOW (ref 36.0–46.0)
Hemoglobin: 10 g/dL — ABNORMAL LOW (ref 12.0–15.0)
Immature Granulocytes: 0 %
Lymphocytes Relative: 10 %
Lymphs Abs: 0.4 10*3/uL — ABNORMAL LOW (ref 0.7–4.0)
MCH: 34.4 pg — ABNORMAL HIGH (ref 26.0–34.0)
MCHC: 31 g/dL (ref 30.0–36.0)
MCV: 111 fL — ABNORMAL HIGH (ref 80.0–100.0)
Monocytes Absolute: 0.2 10*3/uL (ref 0.1–1.0)
Monocytes Relative: 5 %
Neutro Abs: 3.2 10*3/uL (ref 1.7–7.7)
Neutrophils Relative %: 85 %
Platelets: 233 10*3/uL (ref 150–400)
RBC: 2.91 MIL/uL — ABNORMAL LOW (ref 3.87–5.11)
RDW: 14.4 % (ref 11.5–15.5)
WBC: 3.8 10*3/uL — ABNORMAL LOW (ref 4.0–10.5)
nRBC: 0 % (ref 0.0–0.2)

## 2020-05-18 LAB — LIPID PANEL
Cholesterol: 125 mg/dL (ref 0–200)
HDL: 52 mg/dL (ref >40)
LDL Cholesterol: 62 mg/dL (ref 0–99)
Total CHOL/HDL Ratio: 2.4 RATIO
Triglycerides: 53 mg/dL (ref <150)
VLDL: 11 mg/dL (ref 0–40)

## 2020-05-18 LAB — FERRITIN: Ferritin: 102 ng/mL (ref 11–307)

## 2020-05-18 LAB — MAGNESIUM: Magnesium: 1.9 mg/dL (ref 1.7–2.4)

## 2020-05-18 LAB — C-REACTIVE PROTEIN: CRP: 13.1 mg/dL — ABNORMAL HIGH (ref <1.0)

## 2020-05-18 LAB — PHOSPHORUS: Phosphorus: 4.7 mg/dL — ABNORMAL HIGH (ref 2.5–4.6)

## 2020-05-18 LAB — D-DIMER, QUANTITATIVE: D-Dimer, Quant: 0.54 ug/mL-FEU — ABNORMAL HIGH (ref 0.00–0.50)

## 2020-05-18 MED ORDER — POTASSIUM CHLORIDE CRYS ER 20 MEQ PO TBCR
30.0000 meq | EXTENDED_RELEASE_TABLET | Freq: Two times a day (BID) | ORAL | Status: AC
  Administered 2020-05-18 (×2): 30 meq via ORAL
  Filled 2020-05-18 (×2): qty 1

## 2020-05-18 NOTE — Evaluation (Signed)
Physical Therapy Evaluation Patient Details Name: Brandy Gutierrez MRN: [id number] DOB: 06-Oct-1960 Today's Date: 05/18/2020   History of Present Illness  Patient is a 59 year old female PMHx Seizures, Memory DO, TIA x6, gait abnormality, anterior cerebral aneurysm, COPD, asthma due to seasonal allergies, fibromyalgia, HTN + COVID with home testing kit per patient on 9/22. Patient with increased dysnpea at home, admitted 9/26   Clinical Impression  The patient is very weak and unsteady when standing.Patient's SPO2 on 4 L remained in mid 90's, HR in 90's during  Mobility. Patient reports falls at home, reports neuropathy. Patient endorses having spouse and other caregivers at home. Patient currently requires assistance for all aspects of care/ADL's. Pt admitted with above diagnosis.   Pt currently with functional limitations due to the deficits listed below (see PT Problem List). Pt will benefit from skilled PT to increase their independence and safety with mobility to allow discharge to the venue listed below.    .    Follow Up Recommendations Home health PT;Supervision/Assistance - 24 hour    Equipment Recommendations  None recommended by PT    Recommendations for Other Services       Precautions / Restrictions Precautions Precautions: Fall Precaution Comments: monitor vitals Restrictions Weight Bearing Restrictions: No      Mobility  Bed Mobility Overal bed mobility: Needs Assistance Bed Mobility: Rolling;Sidelying to Sit Rolling: Min assist Sidelying to sit: Mod assist       General bed mobility comments: cues to sequence rolling in bed, requires increased time to initiate. mod A to elevate trunk to sitting  Transfers Overall transfer level: Needs assistance Equipment used: Rolling walker (2 wheeled) Transfers: Sit to/from [facility] Sit to Stand: Mod assist;+2 safety/equipment Stand pivot transfers: Mod assist;+2 safety/equipment       General  transfer comment: requires mod A x1-2 to power up to standing and stabilize, pivot quickly to [facility] for having loose BM.When standing from commode patients LE slide foward with poor balance, stability. +2 for safety  Ambulation/Gait                Stairs            Wheelchair Mobility    Modified Rankin (Stroke Patients Only)       Balance Overall balance assessment: History of Falls;Needs assistance Sitting-balance support: Feet supported;Bilateral upper extremity supported Sitting balance-Leahy Scale: Fair     Standing balance support: Bilateral upper extremity supported;During functional activity Standing balance-Leahy Scale: Poor Standing balance comment: reliant on B UE support                             Pertinent Vitals/Pain Pain Assessment: Faces Faces Pain Scale: Hurts little more Pain Location: back (chronic) Pain Descriptors / Indicators: Sore Pain Intervention(s): Monitored during session    Home Living Family/patient expects to be discharged to:: Private residence Living Arrangements: Spouse/significant other Available Help at Discharge: Family;Available 24 hours/day;Personal care attendant (spouse works from home) Type of Home: House Home Access: Stairs to enter   [employer] of Steps: [address] [place]: One level Home Equipment: Environmental consultant - 2 wheels;Walker - 4 wheels;Shower seat;Grab bars - tub/shower;Bedside commode      Prior Function Level of Independence: Needs assistance   Gait / Transfers Assistance Needed: uses walker "sometimes"  ADL's / Homemaking Assistance Needed: has caregiver everyday for ~8 hrs to assist "with whatever I need" tries to do ADLs herself per patient  Comments: sometimes walks without walker     Hand Dominance   Dominant Hand: Right    Extremity/Trunk Assessment   Upper Extremity Assessment Upper Extremity Assessment: Defer to OT evaluation RUE Deficits / Details: reports B hand  neuropathy    Lower Extremity Assessment Lower Extremity Assessment: Generalized weakness;LLE deficits/detail;RLE deficits/detail RLE Sensation: history of peripheral neuropathy LLE Sensation: history of peripheral neuropathy    Cervical / Trunk Assessment Cervical / Trunk Assessment: Kyphotic  Communication   Communication: No difficulties  Cognition Arousal/Alertness: Awake/alert Behavior During Therapy: WFL for tasks assessed/performed Overall Cognitive Status: No family/caregiver present to determine baseline cognitive functioning                                 General Comments: patient states hospital name "[facility]," increased time for processing/task initiation      General Comments General comments (skin integrity, edema, etc.): patient O2 maintain mid 90s on 4L O2, HR in 90s    Exercises     Assessment/Plan    PT Assessment Patient needs continued PT services  PT Problem List Decreased strength;Decreased knowledge of use of DME;Decreased activity tolerance;Decreased safety awareness;Decreased balance;Decreased knowledge of precautions;Decreased mobility;Cardiopulmonary status limiting activity       PT Treatment Interventions DME instruction;Gait training;Functional mobility training;Patient/family education;Therapeutic activities;Therapeutic exercise    PT Goals (Current goals can be found in the Care Plan section)  Acute Rehab PT Goals Patient Stated Goal: agreeable to get up to chair PT Goal Formulation: With patient Time For Goal Achievement: 06/01/20 Potential to Achieve Goals: Fair    Frequency Min 3X/week   Barriers to discharge        Co-evaluation PT/OT/SLP Co-Evaluation/Treatment: Yes Reason for Co-Treatment: For patient/therapist safety PT goals addressed during session: Mobility/safety with mobility OT goals addressed during session: ADL's and self-care       AM-PAC PT "6 Clicks" Mobility  Outcome Measure Help needed  turning from your back to your side while in a flat bed without using bedrails?: A Little Help needed moving from lying on your back to sitting on the side of a flat bed without using bedrails?: A Little Help needed moving to and from a bed to a chair (including a wheelchair)?: A Lot Help needed standing up from a chair using your arms (e.g., wheelchair or bedside chair)?: A Lot Help needed to walk in hospital room?: A Lot Help needed climbing 3-5 steps with a railing? : Total 6 Click Score: 13    End of Session   Activity Tolerance: Patient limited by fatigue Patient left: in chair;with call bell/phone within reach;with chair alarm set Nurse Communication: Mobility status PT Visit Diagnosis: Difficulty in walking, not elsewhere classified (R26.2)    Time: 8421-0312 PT Time Calculation (min) (ACUTE ONLY): 28 min   Charges:   PT Evaluation $PT Eval Low Complexity: [place] Pager [phone] Office [phone]   Claretha Cooper 05/18/2020, 4:44 PM

## 2020-05-18 NOTE — Progress Notes (Signed)
Patient refused tele monitor entire shift

## 2020-05-18 NOTE — Progress Notes (Addendum)
Occupational Therapy Evaluation   Patient with functional deficits listed below impacting safety and independence with self care. Patient requires increased time for processing/task initiation and decreased safety awareness. Unsure of patient's baseline however chart states hx of cerebral aneurysm/memory deficits. Patient also reports caregiver assist at home 8hrs daily to assist "with whatever I tell her to do" but endorses she tries to do as much self care herself. Patient also reports multiple falls at home and using walker "sometimes." Currently patient is mod A x1-2 for safety and stability with transfer to bedside commode, then recliner. Patient supervision for perianal care seated and max A for clothing management. Recommend continued acute OT services to facilitate D/C for [facility] if patient has 24/7 A.     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 Patient with increased dysnpea at home, admitted 9/26   Precautions  Precautions Fall  Precaution Comments monitor vitals  Restrictions  Weight Bearing Restrictions No  Home Living  Family/patient expects to be discharged to: Private residence  Living Arrangements Spouse/significant other  Available Help at Discharge Family;Available 24 hours/day;Personal care attendant (spouse works from home)  Type of [place] to enter  [employer] of Steps 2  [place] One level   Bathroom Shower/Tub Tub/shower unit  Central - 2 wheels;Walker - 4 wheels;Shower seat;Grab bars - tub/shower;BSC  Prior Function  Level of Independence Needs assistance  Gait / Transfers Assistance Needed uses walker "sometimes"  ADL's / Homemaking Assistance Needed has caregiver everyday for ~8 hrs to assist "with whatever I need" tries to do ADLs herself per patient  Communication  Communication No difficulties  Pain Assessment  Pain Assessment Faces  Faces Pain Scale 4  Pain Location back (chronic)  Pain Descriptors / Indicators Sore  Pain Intervention(s) Monitored during session  Cognition  Arousal/Alertness Awake/alert  Behavior During Therapy [facility] for tasks assessed/performed  Overall Cognitive Status No family/caregiver present to determine baseline cognitive functioning  General Comments patient states hospital name "[facility]," increased time for processing/task initiation  Upper Extremity Assessment  Upper Extremity Assessment Generalized weakness;LUE deficits/detail;RUE deficits/detail  RUE Deficits / Details reports B hand neuropathy  Lower Extremity Assessment  Lower Extremity Assessment Defer to PT evaluation  ADL  Overall ADL's  Needs assistance/impaired  Grooming Wash/dry hands;Set up;Sitting  Upper Body Bathing Supervision/ safety;Sitting  Lower Body Bathing Maximal assistance;Sitting/lateral leans;Sit to/from stand  Upper Body Dressing  Supervision/safety;Sitting  Lower Body Dressing Maximal assistance;Sitting/lateral leans;Sit to/from stand  Lower Body Dressing Details (indicate cue type and reason) patient attempts to pull up underwear in standing however decreased balance, fall risk  Toilet Transfer Moderate assistance;+2 for safety/equipment;Stand-pivot;Cueing for safety;Cueing for sequencing;RW;BSC  Toilet Transfer Details (indicate cue type and reason) decreased balance, safety awareness, activity  tolerance, pt tremulous  Toileting- Clothing Manipulation and Hygiene Supervision/safety;Sitting/lateral lean; maximal assistance  Toileting - Clothing Manipulation Details (indicate cue type and reason) pt able to perform perianal care seated on commode, max assist for clothing management  Functional mobility during ADLs Moderate assistance;+2 for safety/equipment;Cueing for safety;Cueing for sequencing;Rolling walker  General ADL Comments patient requiring assistance with self care due to decreased balance, activity tolerance, safety awareness, coordination  Bed Mobility  Overal bed mobility Needs Assistance  Bed Mobility Rolling;Sidelying to Sit  Rolling Min assist  Sidelying to sit Mod assist  General bed mobility comments cues to sequence rolling in bed, requires increased time to initiate. mod A to elevate trunk to sitting  Transfers  Overall transfer level Needs assistance  Equipment used Rolling walker (2 wheeled)  Transfers Sit to/from [employer] Transfers  Sit to Stand Mod assist;+2 safety/equipment  Stand pivot transfers Mod assist;+2 safety/equipment  General transfer comment requires mod A x1-2 to power up to standing and stabilize, when standing from commode patients LE slide foward with poor balance, stability. +2 for safety  Balance  Overall balance assessment Needs assistance  Sitting-balance support Feet supported  Sitting balance-Leahy Scale Fair  Standing balance support Bilateral upper extremity supported  Standing balance-Leahy Scale Poor  Standing balance comment reliant on B UE support  General Comments  General comments (skin integrity, edema, etc.) patient O2 maintain mid 90s on 4L O2, HR in 90s  OT - End of Session  Equipment Utilized During Treatment Rolling walker;Oxygen  Activity Tolerance Patient tolerated treatment well  Patient left in chair;with call bell/phone within reach;with chair alarm set  Nurse Communication Mobility status  OT  Assessment  OT Recommendation/Assessment Patient needs continued OT Services  OT Visit Diagnosis Unsteadiness on feet (R26.81);Other abnormalities of gait and mobility (R26.89);Muscle weakness (generalized) (M62.81);Other symptoms and signs involving cognitive function  OT Problem List Decreased strength;Decreased activity tolerance;Impaired balance (sitting and/or standing);Decreased cognition;Decreased safety awareness  OT Plan  OT Frequency (ACUTE ONLY) Min 2X/week  OT Treatment/Interventions (ACUTE ONLY) Self-care/ADL training;Therapeutic exercise;Therapeutic activities;Patient/family education;Balance training;Cognitive remediation/compensation;DME and/or AE instruction  AM-PAC OT "6 Clicks" Daily Activity Outcome Measure (Version 2)  Help from another person eating meals? 4  Help from another person taking care of personal grooming? 3  Help from another person toileting, which includes using toliet, bedpan, or urinal? 2  Help from another person bathing (including washing, rinsing, drying)? 2  Help from another person to put on and taking off regular upper body clothing? 3  Help from another person to put on and taking off regular lower body clothing? 2  6 Click Score 16  OT Recommendation  Follow Up Recommendations Home health OT;Supervision/Assistance - 24 hour;Other (comment) ([place] if 24/7 available, if not SNF)  OT Equipment None recommended by OT  Individuals Consulted  Consulted and Agree with Results and Recommendations Patient  Acute Rehab OT Goals  Patient Stated Goal agreeable to get up to chair  OT Goal Formulation With patient  Time For Goal Achievement 06/01/20  Potential to Achieve Goals Good  OT Time Calculation  OT Start Time (ACUTE ONLY) 1328  OT Stop Time (ACUTE ONLY) 1352  OT Time Calculation (min) 24 min  OT General Charges  $OT Visit 1 Visit  OT Evaluation  $OT Eval Moderate Complexity 1 Mod  Written Expression  Dominant Hand Right   Delbert Phenix OT OT  pager: [phone]

## 2020-05-18 NOTE — Progress Notes (Signed)
PROGRESS NOTE    [name]  ATF:[id number] DOB: 1961-07-17 DOA: 05/14/2020 PCP: Neale Burly, MD     Brief Narrative:  Brandy Gutierrez is a 59 y.o. WF PMHx Seizures, Memory DO, TIA x6, gait abnormality, anterior cerebral aneurysm, COPD, asthma due to seasonal allergies, fibromyalgia, HTN,.   Presenting with 5 days of dyspnea. She tested positive for COVID with a home COVID kit. 4 days ago per her report. She states that she has only try tylenol for relief since the beginning of her symptoms. It has provided no relief. She became concerned when her home pulse oximeter began registering values in the 80's. So she came to the ED. She denies any other alleviating or aggravating factors.    ED Course: Found to be COVID positive. Found to be hypoxic on RA. Started on remdes and decadron. TRH called for admission.   Review of Systems:  She reports HA and sore throat. She denies chest pain, palpitations, fever. Review of systems is otherwise negative for all not mentioned in HPI.     Subjective: 9/30  Afebrile overnight.  A/O x1 (does not know where, when, why) believes she is in [place].  Follows commands.  According to charge RN earlier in the day patient cognition was better reported as A/O x4.  Appears to be some waxing and waning cognition.   Assessment & Plan: Covid vaccination;   Active Problems:   Fibromyalgia   Memory disorder   Anterior cerebral aneurysm   Seizure disorder ([place])   COVID-19   COPD mixed type ([place])   Acute respiratory failure with hypoxia ([facility])   Pneumonia due to COVID-19 virus   HLD (hyperlipidemia)   Hypokalemia   Hypophosphatemia   Acute Respiratory Failure with Hypoxia/ COVID 19 Pneumonia  COVID-19 Labs  Recent Labs    05/16/20 0343 05/17/20 0332 05/17/20 0900 05/18/20 0425  DDIMER 0.71* 0.62*  --  0.54*  FERRITIN  --   --  102 102  LDH  --   --  248* 236*  CRP 9.4* 13.4*  --  13.1*    Lab Results  Component Value  Date   SARSCOV2NAA POSITIVE (A) 05/14/2020  -Patient newly hypoxic, with elevated CRP> 7, on 3 L O2 (new). -Solu-Medrol 40 mg BID -Remdesivir x5 days per pharmacy protocol -Respimat QID -Vitamin C and zinc per Covid protocol -Flutter valve -Incentive spirometry -Prone patient 8 hours per day; if patient cannot tolerate prone 2 to 3 hours per shift -Titrate O2 to maintain SPO2> 88% -Out of bed to chair every shift -PT; work with patient daily on strengthening mobility -10/1 PCXR pending  Seizure DO -Lamictal 50 mg BID -Keppra 500 mg BID  Acute metabolic encephalopathy -2/70 EEG shows no seizure activity but shows diffuse encephalopathy see results below. -This could be multifactorial to include Covid, medication, electrolyte imbalance, withdrawal from multiple home medication. -We will endeavor to correct underlying imbalances.  Anemia unspecified -Anemia panel pending  HLD -9/30 LDL = 62   Hypokalemia --Potassium goal>4 -9/30Klor-Con 30 meq x 2 dose  Hypophosphatemia --Phosphorous goal> 2.5   Goals of care -9/30 physical therapy; evaluate for SNF vs home health   DVT prophylaxis: Lovenox Code Status: Full  family Communication: 9/30 spoke with Dominica Severin (husband) counseled on plan of care answered all questions Status is: Inpatient    Dispo: The patient is from: Home              Anticipated d/c is to: SNF?  Anticipated d/c date is: 10/5              Patient currently       Consultants:    Procedures/Significant Events:  9/29 EEG;study is suggestive of moderate diffuse encephalopathy, nonspecific to etiology. No seizures or epileptiform discharges were seen throughout the recording.   I have personally reviewed and interpreted all radiology studies and my findings are as above.  VENTILATOR SETTINGS: Nasal Cannula 9/30 Flow; 3L/min SPO2; 100%   Cultures   Antimicrobials: Anti-infectives (From admission, onward)   Start     Ordered  Stop   05/15/20 1000  remdesivir 100 mg in sodium chloride 0.9 % 100 mL IVPB  Status:  Discontinued       "Followed by" Linked Group Details   05/14/20 1811 05/14/20 1812   05/15/20 1000  remdesivir 100 mg in sodium chloride 0.9 % 100 mL IVPB       "Followed by" Linked Group Details   05/14/20 1734 05/19/20 0959   05/14/20 1811  remdesivir 200 mg in sodium chloride 0.9% 250 mL IVPB  Status:  Discontinued       "Followed by" Linked Group Details   05/14/20 1811 05/14/20 1812   05/14/20 1745  remdesivir 200 mg in sodium chloride 0.9% 250 mL IVPB       "Followed by" Linked Group Details   05/14/20 1734 05/14/20 2029       Devices    LINES / TUBES:      Continuous Infusions:    Objective: Vitals:   05/17/20 0402 05/17/20 1343 05/17/20 1959 05/18/20 0347  BP: (!) 146/92 107/78 135/82 135/89  Pulse: 97 82 85 76  Resp: '15 16 15 15  ' Temp: 98.6 F (37 C) 98.1 F (36.7 C) 97.9 F (36.6 C) 97.6 F (36.4 C)  TempSrc: Oral Oral Oral Axillary  SpO2: 92% 96% 91% 100%  Weight:      Height:        Intake/Output Summary (Last 24 hours) at 05/18/2020 1309 Last data filed at 05/18/2020 4492 Gross per 24 hour  Intake 433.79 ml  Output --  Net 433.79 ml   Filed Weights   05/14/20 1134  Weight: 46.3 kg   Physical Exam:  General: A/O x1 (does not know where, when, why) No acute respiratory distress, cachectic Eyes: negative scleral hemorrhage, negative anisocoria, negative icterus ENT: Negative Runny nose, negative gingival bleeding, Neck:  Negative scars, masses, torticollis, lymphadenopathy, JVD Lungs: diffuse rhonchi bilaterally without wheezes or crackles Cardiovascular: Regular rate and rhythm without murmur gallop or rub normal S1 and S2 Abdomen: negative abdominal pain, nondistended, positive soft, bowel sounds, no rebound, no ascites, no appreciable mass Extremities: No significant cyanosis, clubbing, or edema bilateral lower extremities Skin: Negative rashes,  lesions, ulcers Psychiatric: Unable to evaluate secondary to encephalopathy Central nervous system:  Cranial nerves II through XII intact, tongue/uvula midline, all extremities muscle strength 5/5, sensation intact throughout, negative dysarthria, negative expressive aphasia, negative receptive aphasia.  .     Data Reviewed: Care during the described time interval was provided by me .  I have reviewed this patient's available data, including medical history, events of note, physical examination, and all test results as part of my evaluation.  CBC: Recent Labs  Lab 05/14/20 1331 05/15/20 0435 05/16/20 0343 05/17/20 0332 05/18/20 0425  WBC 6.4 5.2 5.8 6.1 3.8*  NEUTROABS 4.9 3.6 3.8 4.0 3.2  HGB 10.5* 9.9* 10.5* 10.7* 10.0*  HCT 33.9* 31.1* 35.3* 34.6* 32.3*  MCV  112.6* 110.3* 120.1* 111.3* 111.0*  PLT 163 160 180 225 914   Basic Metabolic Panel: Recent Labs  Lab 05/14/20 1331 05/15/20 0435 05/16/20 0343 05/17/20 0332 05/17/20 0900 05/18/20 0425  NA 134* 136 137 137  --  136  K 4.0 3.8 4.2 3.2*  --  3.1*  CL 95* 94* 96* 92*  --  91*  CO2 '29 28 26 28  ' --  31  GLUCOSE 102* 90 70 84  --  136*  BUN '10 10 12 10  ' --  9  CREATININE 0.64 0.59 0.61 0.57  --  0.58  CALCIUM 9.5 8.7* 8.5* 8.9  --  8.5*  MG  --  1.6* 2.4 1.9  --  1.9  PHOS  --  3.0 2.3*  --  1.5* 4.7*   GFR: Estimated Creatinine Clearance: 56 mL/min (by C-G formula based on SCr of 0.58 mg/dL). Liver Function Tests: Recent Labs  Lab 05/14/20 1331 05/15/20 0435 05/16/20 0343 05/17/20 0332 05/18/20 0425  AST 56* 44* 45* 34 32  ALT '30 25 22 21 20  ' ALKPHOS 91 73 76 85 82  BILITOT 0.4 0.4 1.0 0.8 0.8  PROT 7.4 6.3* 6.6 6.6 6.7  ALBUMIN 3.4* 2.8* 3.0* 3.1* 2.9*   No results for input(s): LIPASE, AMYLASE in the last 168 hours. Recent Labs  Lab 05/16/20 1236  AMMONIA 19   Coagulation Profile: No results for input(s): INR, PROTIME in the last 168 hours. Cardiac Enzymes: No results for input(s): CKTOTAL,  CKMB, CKMBINDEX, TROPONINI in the last 168 hours. BNP (last 3 results) No results for input(s): PROBNP in the last 8760 hours. HbA1C: No results for input(s): HGBA1C in the last 72 hours. CBG: No results for input(s): GLUCAP in the last 168 hours. Lipid Profile: Recent Labs    05/18/20 0425  CHOL 125  HDL 52  LDLCALC 62  TRIG 53  CHOLHDL 2.4   Thyroid Function Tests: Recent Labs    05/16/20 1236  TSH 0.885  FREET4 1.52*   Anemia Panel: Recent Labs    05/16/20 1236 05/17/20 0900 05/18/20 0425  VITAMINB12 1,208*  --   --   FERRITIN  --  102 102   Sepsis Labs: Recent Labs  Lab 05/14/20 1331  PROCALCITON 1.58  LATICACIDVEN 0.9    Recent Results (from the past 240 hour(s))  Blood Culture (routine x 2)     Status: None (Preliminary result)   Collection Time: 05/14/20  1:29 PM   Specimen: Left Antecubital; Blood  Result Value Ref Range Status   Specimen Description   Final    LEFT ANTECUBITAL Performed at [facility], [place] [address], [place], [place] [address]    Special Requests   Final    BOTTLES DRAWN AEROBIC AND ANAEROBIC Blood Culture adequate volume Performed at [place] [address] [place], [place] [address]    Culture   Final    NO GROWTH 4 DAYS Performed at [facility], [place] [address] [place], [place] [address]    Report Status PENDING  Incomplete  Blood Culture (routine x 2)     Status: None (Preliminary result)   Collection Time: 05/14/20  1:31 PM   Specimen: Right Antecubital; Blood  Result Value Ref Range Status   Specimen Description   Final    RIGHT ANTECUBITAL Performed at [place] [address], [place], [place] [address]    Special Requests   Final    BOTTLES DRAWN  AEROBIC AND ANAEROBIC Blood Culture adequate volume Performed at [facility] [address], [place], [place] [address]    Culture   Final    NO GROWTH 4 DAYS Performed at  [facility], [place] [address] [place], [place] [address]    Report Status PENDING  Incomplete  Resp Panel by RT PCR (RSV, Flu A&B, Covid) - Nasopharyngeal Swab     Status: Abnormal   Collection Time: 05/14/20  1:31 PM   Specimen: Nasopharyngeal Swab  Result Value Ref Range Status   SARS Coronavirus 2 by RT PCR POSITIVE (A) NEGATIVE Final    Comment: RESULT CALLED TO, READ BACK BY AND VERIFIED WITH: WILLIAM,F RN '@[id number]'  ON 05/14/20 JACKSON,K (NOTE) SARS-CoV-2 target nucleic acids are DETECTED.  SARS-CoV-2 RNA is generally detectable in upper respiratory specimens  during the acute phase of infection. Positive results are indicative of the presence of the identified virus, but do not rule out bacterial infection or co-infection with other pathogens not detected by the test. Clinical correlation with patient history and other diagnostic information is necessary to determine patient infection status. The expected result is Negative.  Fact Sheet for Patients:  PinkCheek.be  Fact Sheet for Healthcare Providers: GravelBags.it  This test is not yet approved or cleared by the [place] FDA and  has been authorized for detection and/or diagnosis of SARS-CoV-2 by FDA under an Emergency Use Authorization (EUA).  This EUA will remain in effect (meaning this test ca n be used) for the duration of  the COVID-19 declaration under Section 564(b)(1) of the Act, 21 U.S.C. section 360bbb-3(b)(1), unless the authorization is terminated or revoked sooner.      Influenza A by PCR NEGATIVE NEGATIVE Final   Influenza B by PCR NEGATIVE NEGATIVE Final    Comment: (NOTE) The Xpert Xpress SARS-CoV-2/FLU/RSV assay is intended as an aid in  the diagnosis of influenza from Nasopharyngeal swab specimens and  should not be used as a sole basis for treatment. Nasal washings and  aspirates are unacceptable for Xpert Xpress SARS-CoV-2/FLU/RSV   testing.  Fact Sheet for Patients: PinkCheek.be  Fact Sheet for Healthcare Providers: GravelBags.it  This test is not yet approved or cleared by the [place] FDA and  has been authorized for detection and/or diagnosis of SARS-CoV-2 by  FDA under an Emergency Use Authorization (EUA). This EUA will remain  in effect (meaning this test can be used) for the duration of the  Covid-19 declaration under Section 564(b)(1) of the Act, 21  U.S.C. section 360bbb-3(b)(1), unless the authorization is  terminated or revoked.    Respiratory Syncytial Virus by PCR NEGATIVE NEGATIVE Final    Comment: (NOTE) Fact Sheet for Patients: PinkCheek.be  Fact Sheet for Healthcare Providers: GravelBags.it  This test is not yet approved or cleared by the [place] FDA and  has been authorized for detection and/or diagnosis of SARS-CoV-2 by  FDA under an Emergency Use Authorization (EUA). This EUA will remain  in effect (meaning this test can be used) for the duration of the  COVID-19 declaration under Section 564(b)(1) of the Act, 21 U.S.C.  section 360bbb-3(b)(1), unless the authorization is terminated or  revoked. Performed at [facility], [place] [address], [place], [place] [address]          Radiology Studies: CT HEAD WO CONTRAST  Result Date: 05/17/2020 CLINICAL DATA:  Covid + Delirium. Unable to get hx from pt. EXAM: CT HEAD WITHOUT CONTRAST TECHNIQUE: Contiguous axial images were  obtained from the base of the skull through the vertex without intravenous contrast. COMPARISON:  MR head 12/20/2019, CT angio head 07/10/18 report without images, CT head 06/26/2019 FINDINGS: Brain: Cerebral ventricle sizes are concordant with the degree of cerebral volume loss. No evidence of large-territorial acute infarction. No parenchymal hemorrhage. No mass lesion. No  extra-axial collection. No mass effect or midline shift. No hydrocephalus. Basilar cisterns are patent. Vascular: Anterior communicating artery aneurysmal clip noted. Skull: Negative for fracture or focal lesion. Sinuses/Orbits: Paranasal sinuses and mastoid air cells are clear. The orbits are unremarkable. Other: None. IMPRESSION: No acute intracranial abnormality. Electronically Signed   By: Iven Finn M.D.   On: 05/17/2020 17:26   EEG adult  Result Date: 05/17/2020 Lora Havens, MD     05/17/2020  9:32 AM Patient Name: Brandy Gutierrez MRN: [id number] Epilepsy Attending: Lora Havens Referring Physician/Provider: Dr. Berle Mull Date: 05/18/2019 Duration: 22.26 minutes Patient history: 59 year old female with history of epilepsy was admitted for COVID-19 pneumonia.  EEG to evaluate for seizures. Level of alertness: Awake AEDs during EEG study: Gabapentin, lamotrigine, Keppra Technical aspects: This EEG study was done with scalp electrodes positioned according to the 10-20 International system of electrode placement. Electrical activity was acquired at a sampling rate of '500Hz'  and reviewed with a high frequency filter of '70Hz'  and a low frequency filter of '1Hz' . EEG data were recorded continuously and digitally stored. Description:   No posterior dominant rhythm was seen.  EEG showed continuous generalized 3 to 6 Hz theta-delta slowing.  Hyperventilation and photic stimulation were not performed.   Off note, study was technically difficult due to significant electrode and myogenic artifact. ABNORMALITY -Continuous slow, generalized IMPRESSION: This technically difficult study is suggestive of moderate diffuse encephalopathy, nonspecific to etiology. No seizures or epileptiform discharges were seen throughout the recording. Priyanka Barbra Sarks        Scheduled Meds: . vitamin C  500 mg Oral Daily  . aspirin EC  81 mg Oral QHS  . clopidogrel  75 mg Oral QODAY  . enoxaparin (LOVENOX) injection   40 mg Subcutaneous Q24H  . folic acid  1 mg Oral Daily  . gabapentin  100 mg Oral TID  . Ipratropium-Albuterol  1 puff Inhalation Q6H  . lamoTRIgine  50 mg Oral BID  . levETIRAcetam  500 mg Oral BID  . methylPREDNISolone (SOLU-MEDROL) injection  40 mg Intravenous Q12H  . pantoprazole  40 mg Oral BID  . potassium chloride  30 mEq Oral BID  . propranolol  20 mg Oral BID  . QUEtiapine  25 mg Oral QHS  . rosuvastatin  20 mg Oral QHS  . zinc sulfate  220 mg Oral Daily   Continuous Infusions:    LOS: 4 days    Time spent:40 min    Miranda Frese, Geraldo Docker, MD [facility] Pager [phone]  If 7PM-7AM, please contact night-coverage [email or web address] Password [email or web address] 05/18/2020, 1:09 PM

## 2020-05-19 ENCOUNTER — Inpatient Hospital Stay (HOSPITAL_COMMUNITY): Payer: Medicare Other

## 2020-05-19 LAB — COMPREHENSIVE METABOLIC PANEL
ALT: 16 U/L (ref 0–44)
AST: 28 U/L (ref 15–41)
Albumin: 2.5 g/dL — ABNORMAL LOW (ref 3.5–5.0)
Alkaline Phosphatase: 68 U/L (ref 38–126)
Anion gap: 12 (ref 5–15)
BUN: 13 mg/dL (ref 6–20)
CO2: 27 mmol/L (ref 22–32)
Calcium: 8.2 mg/dL — ABNORMAL LOW (ref 8.9–10.3)
Chloride: 100 mmol/L (ref 98–111)
Creatinine, Ser: 0.58 mg/dL (ref 0.44–1.00)
GFR calc Af Amer: >60 mL/min (ref >60)
GFR calc non Af Amer: >60 mL/min (ref >60)
Glucose, Bld: 131 mg/dL — ABNORMAL HIGH (ref 70–99)
Potassium: 4.1 mmol/L (ref 3.5–5.1)
Sodium: 139 mmol/L (ref 135–145)
Total Bilirubin: 0.9 mg/dL (ref 0.3–1.2)
Total Protein: 5.9 g/dL — ABNORMAL LOW (ref 6.5–8.1)

## 2020-05-19 LAB — CBC WITH DIFFERENTIAL/PLATELET
Abs Immature Granulocytes: 0.01 10*3/uL (ref 0.00–0.07)
Basophils Absolute: 0 10*3/uL (ref 0.0–0.1)
Basophils Relative: 0 %
Eosinophils Absolute: 0 10*3/uL (ref 0.0–0.5)
Eosinophils Relative: 0 %
HCT: 29.7 % — ABNORMAL LOW (ref 36.0–46.0)
Hemoglobin: 9.3 g/dL — ABNORMAL LOW (ref 12.0–15.0)
Immature Granulocytes: 0 %
Lymphocytes Relative: 12 %
Lymphs Abs: 0.4 10*3/uL — ABNORMAL LOW (ref 0.7–4.0)
MCH: 34.2 pg — ABNORMAL HIGH (ref 26.0–34.0)
MCHC: 31.3 g/dL (ref 30.0–36.0)
MCV: 109.2 fL — ABNORMAL HIGH (ref 80.0–100.0)
Monocytes Absolute: 0.4 10*3/uL (ref 0.1–1.0)
Monocytes Relative: 12 %
Neutro Abs: 2.5 10*3/uL (ref 1.7–7.7)
Neutrophils Relative %: 76 %
Platelets: 246 10*3/uL (ref 150–400)
RBC: 2.72 MIL/uL — ABNORMAL LOW (ref 3.87–5.11)
RDW: 14.7 % (ref 11.5–15.5)
WBC: 3.2 10*3/uL — ABNORMAL LOW (ref 4.0–10.5)
nRBC: 0 % (ref 0.0–0.2)

## 2020-05-19 LAB — C-REACTIVE PROTEIN: CRP: 6.6 mg/dL — ABNORMAL HIGH (ref <1.0)

## 2020-05-19 LAB — RETICULOCYTES
Immature Retic Fract: 16.5 % — ABNORMAL HIGH (ref 2.3–15.9)
RBC.: 2.71 MIL/uL — ABNORMAL LOW (ref 3.87–5.11)
Retic Count, Absolute: 47.2 10*3/uL (ref 19.0–186.0)
Retic Ct Pct: 1.7 % (ref 0.4–3.1)

## 2020-05-19 LAB — IRON AND TIBC
Iron: 39 ug/dL (ref 28–170)
Saturation Ratios: 16 % (ref 10.4–31.8)
TIBC: 250 ug/dL (ref 250–450)
UIBC: 211 ug/dL

## 2020-05-19 LAB — VITAMIN B12: Vitamin B-12: 1186 pg/mL — ABNORMAL HIGH (ref 180–914)

## 2020-05-19 LAB — MAGNESIUM: Magnesium: 2 mg/dL (ref 1.7–2.4)

## 2020-05-19 LAB — CULTURE, BLOOD (ROUTINE X 2)
Culture: NO GROWTH
Culture: NO GROWTH
Special Requests: ADEQUATE
Special Requests: ADEQUATE

## 2020-05-19 LAB — FERRITIN
Ferritin: 64 ng/mL (ref 11–307)
Ferritin: 69 ng/mL (ref 11–307)

## 2020-05-19 LAB — VITAMIN B1: Vitamin B1 (Thiamine): 400.7 nmol/L — ABNORMAL HIGH (ref 66.5–200.0)

## 2020-05-19 LAB — FOLATE: Folate: 21.8 ng/mL (ref >5.9)

## 2020-05-19 LAB — D-DIMER, QUANTITATIVE: D-Dimer, Quant: 0.57 ug/mL-FEU — ABNORMAL HIGH (ref 0.00–0.50)

## 2020-05-19 LAB — PHOSPHORUS: Phosphorus: 2.2 mg/dL — ABNORMAL LOW (ref 2.5–4.6)

## 2020-05-19 LAB — LACTATE DEHYDROGENASE: LDH: 184 U/L (ref 98–192)

## 2020-05-19 MED ORDER — SODIUM PHOSPHATES 45 MMOLE/15ML IV SOLN
20.0000 mmol | Freq: Once | INTRAVENOUS | Status: AC
  Administered 2020-05-19: 20 mmol via INTRAVENOUS
  Filled 2020-05-19: qty 6.67

## 2020-05-19 NOTE — Progress Notes (Signed)
Physical Therapy Treatment Patient Details Name: Brandy Gutierrez MRN: [id number] DOB: September 08, 1960 Today's Date: 05/19/2020    History of Present Illness Patient is a 59 year old female PMHx Seizures, Memory DO, TIA x6, gait abnormality, anterior cerebral aneurysm, COPD, asthma due to seasonal allergies, fibromyalgia, HTN + COVID with home testing kit per patient on 9/22. Patient with increased dysnpea at home, admitted 9/26     PT Comments    The patient is very alert and conversive. Patient ambulated in room x 60' using RW and min assistance, cues for turning, reaching back. Patient with BM urgency again today. Patient remains high fall risk. SPO2 on 4 L while ambulating 87%. At rest on 4 L, SPO2 95%. Patient encouraged to use IS and flutter. Provided word search puzzle book and stated:" I can work my brain". Continue PT. Patient will benefit from short term rehab.  Follow Up Recommendations  SNF;Supervision/Assistance - 24 hour     Equipment Recommendations  None recommended by PT    Recommendations for Other Services       Precautions / Restrictions Precautions Precautions: Fall Precaution Comments: monitor vitals    Mobility  Bed Mobility   Bed Mobility: Rolling;Sidelying to Sit Rolling: Supervision         General bed mobility comments: extra time , did complete sitting upright position without assistance  Transfers Overall transfer level: Needs assistance Equipment used: Rolling walker (2 wheeled) Transfers: Sit to/from [facility] Sit to Stand: Min assist Stand pivot transfers: Min assist       General transfer comment: miin assist to trnasfer to [facility] Able to stand and perform pericare with 1 UE  Ambulation/Gait Ambulation/Gait assistance: Min assist Distance 60' Assistive device: Rolling walker (2 wheeled) Gait Pattern/deviations: Decreased stride length;Decreased step length - right;Decreased step length - left;Drifts right/left Gait  velocity: decr   General Gait Details: steady assist for turning, guiding RW.   Stairs             Wheelchair Mobility    Modified Rankin (Stroke Patients Only)       Balance   Sitting-balance support: No upper extremity supported;Feet supported Sitting balance-Leahy Scale: Fair     Standing balance support: Single extremity supported;During functional activity Standing balance-Leahy Scale: Poor Standing balance comment: able  to stand and support self post void and perform pericare with min assistance                            Cognition Arousal/Alertness: Awake/alert Behavior During Therapy: WFL for tasks assessed/performed Overall Cognitive Status: No family/caregiver present to determine baseline cognitive functioning                                 General Comments: patient uis very alert and  conversive today, much improved      Exercises General Exercises - Lower Extremity Ankle Circles/Pumps: AROM;Seated;Both;10 reps Long Arc Quad: AROM;Seated;Both;10 reps Other Exercises Other Exercises: beige TB UE exercises x 10 Other Exercises: IS and Flutter x 10 each. Pull 1000    General Comments        Pertinent Vitals/Pain Pain Location: rectum Pain Descriptors / Indicators: Sore Pain Intervention(s):  (cream applided)    Home Living                      Prior Function  PT Goals (current goals can now be found in the care plan section) Progress towards PT goals: Progressing toward goals    Frequency    Min 2X/week      PT Plan Discharge plan needs to be updated;Frequency needs to be updated    Co-evaluation              AM-PAC PT "6 Clicks" Mobility   Outcome Measure  Help needed turning from your back to your side while in a flat bed without using bedrails?: A Little Help needed moving from lying on your back to sitting on the side of a flat bed without using bedrails?: A Little Help  needed moving to and from a bed to a chair (including a wheelchair)?: A Little Help needed standing up from a chair using your arms (e.g., wheelchair or bedside chair)?: A Little Help needed to walk in hospital room?: A Lot Help needed climbing 3-5 steps with a railing? : Total 6 Click Score: 15    End of Session Equipment Utilized During Treatment: Gait belt;Oxygen Activity Tolerance: Patient tolerated treatment well Patient left: in chair;with call bell/phone within reach;with chair alarm set Nurse Communication: Mobility status PT Visit Diagnosis: Difficulty in walking, not elsewhere classified (R26.2)     Time: 1230-1300 PT Time Calculation (min) (ACUTE ONLY): 30 min  Charges:  $Gait Training: 8-22 mins $Self Care/Home Management: [place] Pager [phone] Office [phone]    Claretha Cooper 05/19/2020, 2:48 PM

## 2020-05-19 NOTE — NC FL2 (Signed)
[place] LEVEL OF CARE SCREENING TOOL     IDENTIFICATION  Patient Name: Brandy Gutierrez Birthdate: [birth year] Sex: female Admission Date (Current Location): 05/14/2020  [facility] and [place] Number:  Herbalist and Address:  [facility],  [place], [place]      Provider Number: [id number]  Attending Physician Name and Address:  Allie Bossier, MD  Relative Name and Phone Number:       Current Level of Care: Hospital Recommended Level of Care: [place] Prior Approval Number:    Date Approved/Denied:   PASRR Number: [id number]  Discharge Plan: SNF    Current Diagnoses: Patient Active Problem List   Diagnosis Date Noted  . COPD mixed type ([place]) 05/17/2020  . Acute respiratory failure with hypoxia ([place]) 05/17/2020  . Pneumonia due to COVID-19 virus 05/17/2020  . HLD (hyperlipidemia) 05/17/2020  . Hypokalemia 05/17/2020  . Hypophosphatemia 05/17/2020  . COVID-19 05/14/2020  . Seizure disorder ([place]) 09/03/2018  . Brain aneurysm 07/09/2018  . Anterior cerebral aneurysm 08/06/2017  . Abnormality of gait 09/10/2016  . Occasional tremors 12/07/2014  . Fibromyalgia 04/08/2014  . Memory disorder 04/08/2014  . Family history of malignant neoplasm of breast 01/14/2014  . Family history of malignant neoplasm of gastrointestinal tract 01/14/2014  . Cervical ca ([place]) 01/14/2014    Orientation RESPIRATION BLADDER Height & Weight     Self, Place, Situation  Normal Incontinent Weight: 46.3 kg Height:  5\' 5"  (165.1 cm)  BEHAVIORAL SYMPTOMS/MOOD NEUROLOGICAL BOWEL NUTRITION STATUS      Incontinent Diet  AMBULATORY STATUS COMMUNICATION OF NEEDS Skin   Extensive Assist Verbally Normal                       Personal Care Assistance Level of Assistance  Bathing, Dressing, Total care Bathing Assistance: Maximum assistance   Dressing Assistance: Maximum assistance Total Care Assistance: Maximum  assistance   Functional Limitations Info             SPECIAL CARE FACTORS FREQUENCY  PT (By licensed PT), OT (By licensed OT)     PT Frequency: 5x weekly OT Frequency: 5x weekly            Contractures Contractures Info: Not present    Additional Factors Info  Code Status, Allergies Code Status Info: Full Allergies Info: Sulfa, metronidazole, carbamezapine, cymbalta           Current Medications (05/19/2020):  This is the current hospital active medication list Current Facility-Administered Medications  Medication Dose Route Frequency Provider Last Rate Last Admin  . acetaminophen (TYLENOL) tablet 650 mg  650 mg Oral Q6H PRN Marylyn Ishihara, Tyrone A, DO   650 mg at 05/19/20 1120  . albuterol (VENTOLIN HFA) 108 (90 Base) MCG/ACT inhaler 2 puff  2 puff Inhalation Q4H PRN Lavina Hamman, MD      . ascorbic acid (VITAMIN C) tablet 500 mg  500 mg Oral Daily Kyle, Tyrone A, DO   500 mg at 05/18/20 1054  . aspirin EC tablet 81 mg  81 mg Oral QHS Kyle, Tyrone A, DO   81 mg at 05/18/20 2055  . clopidogrel (PLAVIX) tablet 75 mg  75 mg Oral QODAY Kyle, Tyrone A, DO   75 mg at 05/19/20 1120  . enoxaparin (LOVENOX) injection 40 mg  40 mg Subcutaneous Q24H Kyle, Tyrone A, DO   40 mg at 05/18/20 2056  . folic acid (FOLVITE) tablet 1 mg  1 mg Oral Daily Kyle, Tyrone A, DO   1 mg at 05/19/20 1120  . gabapentin (NEURONTIN) capsule 100 mg  100 mg Oral TID Lavina Hamman, MD   100 mg at 05/19/20 1120  . guaiFENesin-dextromethorphan (ROBITUSSIN DM) 100-10 MG/5ML syrup 10 mL  10 mL Oral Q4H PRN Marylyn Ishihara, Tyrone A, DO   10 mL at 05/19/20 1129  . haloperidol lactate (HALDOL) injection 2 mg  2 mg Intravenous Q6H PRN Lavina Hamman, MD   2 mg at 05/17/20 2121  . Ipratropium-Albuterol (COMBIVENT) respimat 1 puff  1 puff Inhalation Q6H Allie Bossier, MD   1 puff at 05/19/20 1121  . lamoTRIgine (LAMICTAL) tablet 50 mg  50 mg Oral BID Kyle, Tyrone A, DO   50 mg at 05/19/20 1120  . levETIRAcetam (KEPPRA) tablet  500 mg  500 mg Oral BID Marylyn Ishihara, Tyrone A, DO   500 mg at 05/19/20 1120  . methylPREDNISolone sodium succinate (SOLU-MEDROL) 40 mg/mL injection 40 mg  40 mg Intravenous Q12H Allie Bossier, MD   40 mg at 05/19/20 1121  . ondansetron (ZOFRAN) tablet 4 mg  4 mg Oral Q6H PRN Marylyn Ishihara, Tyrone A, DO       Or  . ondansetron (ZOFRAN) injection 4 mg  4 mg Intravenous Q6H PRN Marylyn Ishihara, Tyrone A, DO      . pantoprazole (PROTONIX) EC tablet 40 mg  40 mg Oral BID Kyle, Tyrone A, DO   40 mg at 05/19/20 1120  . propranolol (INDERAL) tablet 20 mg  20 mg Oral BID Marylyn Ishihara, Tyrone A, DO   20 mg at 05/19/20 1120  . QUEtiapine (SEROQUEL) tablet 25 mg  25 mg Oral QHS Lavina Hamman, MD   25 mg at 05/18/20 2055  . rosuvastatin (CRESTOR) tablet 20 mg  20 mg Oral QHS Kyle, Tyrone A, DO   20 mg at 05/18/20 2055  . zinc sulfate capsule 220 mg  220 mg Oral Daily Kyle, Tyrone A, DO   220 mg at 05/19/20 1120     Discharge Medications: Please see discharge summary for a list of discharge medications.  Relevant Imaging Results:  Relevant Lab Results:   Additional Information SSN [id number]  Joaquin Courts, RN

## 2020-05-19 NOTE — TOC Progression Note (Signed)
Transition of Care [facility]) - Progression Note    Patient Details  Name: Brandy Gutierrez MRN: [id number] Date of Birth: [birth year]  Transition of Care [facility]) CM/SW Contact  Joaquin Courts, RN Phone Number: 05/19/2020, 2:05 PM  Clinical Narrative:  CM received notification from MD stating he spoke with spouse who reports that he does not feel he can adequately care for patient at home.  PT recommendation is for SNF for short term rehab.  CM attempted to reach out to spouse to discuss facility search process, VM left.  FL2 faxed out to area facilities that are accepting Covid + patients.         Expected Discharge Plan: [place] Barriers to Discharge: Continued Medical Work up  Expected Discharge Plan and Services Expected Discharge Plan: [place]   Discharge Planning Services: CM Consult Post Acute Care Choice: [place] Living arrangements for the past 2 months: Single Family Home                 DME Arranged: N/A DME Agency: NA       HH Arranged: NA           Social Determinants of Health (SDOH) Interventions    Readmission Risk Interventions No flowsheet data found.

## 2020-05-19 NOTE — Progress Notes (Signed)
PROGRESS NOTE    Brandy Gutierrez  [name] DOB: 1960/12/09 DOA: 05/14/2020 PCP: Neale Burly, MD     Brief Narrative:  Brandy Gutierrez is a 59 y.o. WF PMHx Seizures, Memory DO, TIA x6, gait abnormality, anterior cerebral aneurysm, COPD, asthma due to seasonal allergies, fibromyalgia, HTN,.   Presenting with 5 days of dyspnea. She tested positive for COVID with a home COVID kit. 4 days ago per her report. She states that she has only try tylenol for relief since the beginning of her symptoms. It has provided no relief. She became concerned when her home pulse oximeter began registering values in the 80's. So she came to the ED. She denies any other alleviating or aggravating factors.    ED Course: Found to be COVID positive. Found to be hypoxic on RA. Started on remdes and decadron. TRH called for admission.   Review of Systems:  She reports HA and sore throat. She denies chest pain, palpitations, fever. Review of systems is otherwise negative for all not mentioned in HPI.     Subjective: 10/1 afebrile overnight.  A/O x4, sitting in the chair comfortably.  States feels much more alert today.  Follows all commands.  Ambulated some with PT today.  Still having diarrhea.   Assessment & Plan: Covid vaccination;   Active Problems:   Fibromyalgia   Memory disorder   Anterior cerebral aneurysm   Seizure disorder ([name])   COVID-19   COPD mixed type ([name])   Acute respiratory failure with hypoxia ([facility])   Pneumonia due to COVID-19 virus   HLD (hyperlipidemia)   Hypokalemia   Hypophosphatemia   Acute Respiratory Failure with Hypoxia/ COVID 19 Pneumonia  COVID-19 Labs  Recent Labs    05/17/20 0332 05/17/20 0900 05/18/20 0425 05/19/20 0325  DDIMER 0.62*  --  0.54* 0.57*  FERRITIN  --  102 102 64  69  LDH  --  248* 236* 184  CRP 13.4*  --  13.1* 6.6*    Lab Results  Component Value Date   SARSCOV2NAA POSITIVE (A) 05/14/2020  -Patient newly hypoxic, with  elevated CRP> 7, on 3 L O2 (new). -Solu-Medrol 40 mg BID -Remdesivir x5 days per pharmacy protocol -Respimat QID -Vitamin C and zinc per Covid protocol -Flutter valve -Incentive spirometry -Prone patient 8 hours per day; if patient cannot tolerate prone 2 to 3 hours per shift -Titrate O2 to maintain SPO2> 88% -Out of bed to chair every shift -PT; work with patient daily on strengthening mobility -10/1 PCXR; nondiagnostic see results below  Seizure DO -Lamictal 50 mg BID -Keppra 500 mg BID  Acute metabolic encephalopathy -4/94 EEG shows no seizure activity but shows diffuse encephalopathy see results below. -This could be multifactorial to include Covid, medication, electrolyte imbalance, withdrawal from multiple home medication. -We will endeavor to correct underlying imbalances. -10/1 appears to have cleared  Normocytic Anemia  -Anemia panel; most consistent with normocytic anemia   HLD -9/30 LDL = 62   Hypokalemia --Potassium goal>4 -9/30 Klor-Con 30 meq x 2 dose  Hypophosphatemia --Phosphorous goal> 2.5 -10/1 sodium phosphate 10 mmol   Goals of care -9/30 Physical Therapy; awaiting SNF placement   DVT prophylaxis: Lovenox Code Status: Full  family Communication: 9/30 spoke with Dominica Severin (husband) counseled on plan of care answered all questions Status is: Inpatient    Dispo: The patient is from: Home              Anticipated d/c is to: SNF?  Anticipated d/c date is: 10/5              Patient currently       Consultants:    Procedures/Significant Events:  9/29 EEG;study is suggestive of moderate diffuse encephalopathy, nonspecific to etiology. No seizures or epileptiform discharges were seen throughout the recording. 10/1 PCXR; no active disease.   I have personally reviewed and interpreted all radiology studies and my findings are as above.  VENTILATOR SETTINGS: Nasal Cannula 10/1 Flow; 2 L/min SPO2;  94%   Cultures   Antimicrobials: Anti-infectives (From admission, onward)   Start     Ordered Stop   05/15/20 1000  remdesivir 100 mg in sodium chloride 0.9 % 100 mL IVPB  Status:  Discontinued       "Followed by" Linked Group Details   05/14/20 1811 05/14/20 1812   05/15/20 1000  remdesivir 100 mg in sodium chloride 0.9 % 100 mL IVPB       "Followed by" Linked Group Details   05/14/20 1734 05/19/20 0959   05/14/20 1811  remdesivir 200 mg in sodium chloride 0.9% 250 mL IVPB  Status:  Discontinued       "Followed by" Linked Group Details   05/14/20 1811 05/14/20 1812   05/14/20 1745  remdesivir 200 mg in sodium chloride 0.9% 250 mL IVPB       "Followed by" Linked Group Details   05/14/20 1734 05/14/20 2029       Devices    LINES / TUBES:      Continuous Infusions:    Objective: Vitals:   05/18/20 1521 05/18/20 2028 05/19/20 0532 05/19/20 1315  BP: 133/79 122/84 117/78 124/78  Pulse: (!) 108 83 83 85  Resp: _0 Temp: 97.9 F (36.6 C) 98.8 F (37.1 C) 97.7 F (36.5 C) 97.9 F (36.6 C)  TempSrc: Oral Oral Oral   SpO2: 97% 100% 97% 94%  Weight:      Height:        Intake/Output Summary (Last 24 hours) at 05/19/2020 1340 Last data filed at 05/19/2020 1235 Gross per 24 hour  Intake 743 ml  Output 0 ml  Net 743 ml   Filed Weights   05/14/20 1134  Weight: 46.3 kg   Physical Exam:  General: A/O x4, positive acute respiratory distress, but improved, cachectic Eyes: negative scleral hemorrhage, negative anisocoria, negative icterus ENT: Negative Runny nose, negative gingival bleeding, Neck:  Negative scars, masses, torticollis, lymphadenopathy, JVD Lungs: rhonchi bilaterally without wheezes or crackles, productive cough (yellow) Cardiovascular: Regular rate and rhythm without murmur gallop or rub normal S1 and S2 Abdomen: negative abdominal pain, nondistended, positive soft, bowel sounds, no rebound, no ascites, no appreciable mass Extremities: No  significant cyanosis, clubbing, or edema bilateral lower extremities Skin: Negative rashes, lesions, ulcers Psychiatric:  Negative depression, negative anxiety, negative fatigue, negative mania  Central nervous system:  Cranial nerves II through XII intact, tongue/uvula midline, all extremities muscle strength 5/5, sensation intact throughout,  negative dysarthria, negative expressive aphasia, negative receptive aphasia.  .     Data Reviewed: Care during the described time interval was provided by me .  I have reviewed this patient's available data, including medical history, events of note, physical examination, and all test results as part of my evaluation.  CBC: Recent Labs  Lab 05/15/20 0435 05/16/20 0343 05/17/20 0332 05/18/20 0425 05/19/20 0325  WBC 5.2 5.8 6.1 3.8* 3.2*  NEUTROABS 3.6 3.8 4.0 3.2 2.5  HGB 9.9* 10.5* 10.7* 10.0*  9.3*  HCT 31.1* 35.3* 34.6* 32.3* 29.7*  MCV 110.3* 120.1* 111.3* 111.0* 109.2*  PLT 160 180 225 233 937   Basic Metabolic Panel: Recent Labs  Lab 05/15/20 0435 05/16/20 0343 05/17/20 0332 05/17/20 0900 05/18/20 0425 05/19/20 0325  NA 136 137 137  --  136 139  K 3.8 4.2 3.2*  --  3.1* 4.1  CL 94* 96* 92*  --  91* 100  CO2 _0 --  31 27  GLUCOSE 90 70 84  --  136* 131*  BUN _1 --  9 13  CREATININE 0.59 0.61 0.57  --  0.58 0.58  CALCIUM 8.7* 8.5* 8.9  --  8.5* 8.2*  MG 1.6* 2.4 1.9  --  1.9 2.0  PHOS 3.0 2.3*  --  1.5* 4.7* 2.2*   GFR: Estimated Creatinine Clearance: 56 mL/min (by C-G formula based on SCr of 0.58 mg/dL). Liver Function Tests: Recent Labs  Lab 05/15/20 0435 05/16/20 0343 05/17/20 0332 05/18/20 0425 05/19/20 0325  AST 44* 45* 34 32 28  ALT _2 ALKPHOS 73 76 85 82 68  BILITOT 0.4 1.0 0.8 0.8 0.9  PROT 6.3* 6.6 6.6 6.7 5.9*  ALBUMIN 2.8* 3.0* 3.1* 2.9* 2.5*   No results for input(s): LIPASE, AMYLASE in the last 168 hours. Recent Labs  Lab 05/16/20 1236  AMMONIA 19   Coagulation  Profile: No results for input(s): INR, PROTIME in the last 168 hours. Cardiac Enzymes: No results for input(s): CKTOTAL, CKMB, CKMBINDEX, TROPONINI in the last 168 hours. BNP (last 3 results) No results for input(s): PROBNP in the last 8760 hours. HbA1C: No results for input(s): HGBA1C in the last 72 hours. CBG: No results for input(s): GLUCAP in the last 168 hours. Lipid Profile: Recent Labs    05/18/20 0425  CHOL 125  HDL 52  LDLCALC 62  TRIG 53  CHOLHDL 2.4   Thyroid Function Tests: No results for input(s): TSH, T4TOTAL, FREET4, T3FREE, THYROIDAB in the last 72 hours. Anemia Panel: Recent Labs    05/18/20 0425 05/19/20 0325  VITAMINB12  --  1,186*  FOLATE  --  21.8  FERRITIN 102 64  69  TIBC  --  250  IRON  --  39  RETICCTPCT  --  1.7   Sepsis Labs: Recent Labs  Lab 05/14/20 1331  PROCALCITON 1.58  LATICACIDVEN 0.9    Recent Results (from the past 240 hour(s))  Blood Culture (routine x 2)     Status: None   Collection Time: 05/14/20  1:29 PM   Specimen: Left Antecubital; Blood  Result Value Ref Range Status   Specimen Description   Final    LEFT ANTECUBITAL Performed at [facility], [place] [address] [place], [place] [address]    Special Requests   Final    BOTTLES DRAWN AEROBIC AND ANAEROBIC Blood Culture adequate volume Performed at [place] [address], [place], [place] [address]    Culture   Final    NO GROWTH 5 DAYS Performed at [facility], [place] [address] [place], [place] [address]    Report Status 05/19/2020 FINAL  Final  Blood Culture (routine x 2)     Status: None   Collection Time: 05/14/20  1:31 PM   Specimen: Right Antecubital; Blood  Result Value Ref Range Status   Specimen Description   Final    RIGHT ANTECUBITAL Performed at [facility],  [place] [address], [place], [place] [address]    Special Requests   Final    BOTTLES DRAWN AEROBIC AND ANAEROBIC Blood  Culture adequate volume Performed at [place] [address], [place], [place] [address]    Culture   Final    NO GROWTH 5 DAYS Performed at [facility], [place] [address], [place], [place] [address]    Report Status 05/19/2020 FINAL  Final  Resp Panel by RT PCR (RSV, Flu A&B, Covid) - Nasopharyngeal Swab     Status: Abnormal   Collection Time: 05/14/20  1:31 PM   Specimen: Nasopharyngeal Swab  Result Value Ref Range Status   SARS Coronavirus 2 by RT PCR POSITIVE (A) NEGATIVE Final    Comment: RESULT CALLED TO, READ BACK BY AND VERIFIED WITH: WILLIAM,F RN _0  ON 05/14/20 JACKSON,K (NOTE) SARS-CoV-2 target nucleic acids are DETECTED.  SARS-CoV-2 RNA is generally detectable in upper respiratory specimens  during the acute phase of infection. Positive results are indicative of the presence of the identified virus, but do not rule out bacterial infection or co-infection with other pathogens not detected by the test. Clinical correlation with patient history and other diagnostic information is necessary to determine patient infection status. The expected result is Negative.  Fact Sheet for Patients:  PinkCheek.be  Fact Sheet for Healthcare Providers: GravelBags.it  This test is not yet approved or cleared by the [place] FDA and  has been authorized for detection and/or diagnosis of SARS-CoV-2 by FDA under an Emergency Use Authorization (EUA).  This EUA will remain in effect (meaning this test ca n be used) for the duration of  the COVID-19 declaration under Section 564(b)(1) of the Act, 21 U.S.C. section 360bbb-3(b)(1), unless the authorization is terminated or revoked sooner.      Influenza A by PCR NEGATIVE NEGATIVE Final   Influenza B by PCR NEGATIVE NEGATIVE Final    Comment: (NOTE) The Xpert Xpress SARS-CoV-2/FLU/RSV assay is intended as an aid in  the diagnosis of influenza from  Nasopharyngeal swab specimens and  should not be used as a sole basis for treatment. Nasal washings and  aspirates are unacceptable for Xpert Xpress SARS-CoV-2/FLU/RSV  testing.  Fact Sheet for Patients: PinkCheek.be  Fact Sheet for Healthcare Providers: GravelBags.it  This test is not yet approved or cleared by the [place] FDA and  has been authorized for detection and/or diagnosis of SARS-CoV-2 by  FDA under an Emergency Use Authorization (EUA). This EUA will remain  in effect (meaning this test can be used) for the duration of the  Covid-19 declaration under Section 564(b)(1) of the Act, 21  U.S.C. section 360bbb-3(b)(1), unless the authorization is  terminated or revoked.    Respiratory Syncytial Virus by PCR NEGATIVE NEGATIVE Final    Comment: (NOTE) Fact Sheet for Patients: PinkCheek.be  Fact Sheet for Healthcare Providers: GravelBags.it  This test is not yet approved or cleared by the [place] FDA and  has been authorized for detection and/or diagnosis of SARS-CoV-2 by  FDA under an Emergency Use Authorization (EUA). This EUA will remain  in effect (meaning this test can be used) for the duration of the  COVID-19 declaration under Section 564(b)(1) of the Act, 21 U.S.C.  section 360bbb-3(b)(1), unless the authorization is terminated or  revoked. Performed at [facility], [place] [address], [place], [place] [address]          Radiology Studies: CT HEAD WO CONTRAST  Result Date: 05/17/2020 CLINICAL  DATA:  Covid + Delirium. Unable to get hx from pt. EXAM: CT HEAD WITHOUT CONTRAST TECHNIQUE: Contiguous axial images were obtained from the base of the skull through the vertex without intravenous contrast. COMPARISON:  MR head 12/20/2019, CT angio head 07/10/18 report without images, CT head 06/26/2019 FINDINGS: Brain: Cerebral  ventricle sizes are concordant with the degree of cerebral volume loss. No evidence of large-territorial acute infarction. No parenchymal hemorrhage. No mass lesion. No extra-axial collection. No mass effect or midline shift. No hydrocephalus. Basilar cisterns are patent. Vascular: Anterior communicating artery aneurysmal clip noted. Skull: Negative for fracture or focal lesion. Sinuses/Orbits: Paranasal sinuses and mastoid air cells are clear. The orbits are unremarkable. Other: None. IMPRESSION: No acute intracranial abnormality. Electronically Signed   By: Iven Finn M.D.   On: 05/17/2020 17:26   DG CHEST PORT 1 VIEW  Result Date: 05/19/2020 CLINICAL DATA:  Pneumonia EXAM: PORTABLE CHEST 1 VIEW COMPARISON:  05/16/2020 FINDINGS: Normal heart size and mediastinal contours. There is prominence of the left hilum attributed to rotation given better aligned radiograph 05/14/2020. There is no edema, consolidation, effusion, or pneumothorax. IMPRESSION: No evidence of active disease. Electronically Signed   By: Monte Fantasia M.D.   On: 05/19/2020 05:50        Scheduled Meds: . vitamin C  500 mg Oral Daily  . aspirin EC  81 mg Oral QHS  . clopidogrel  75 mg Oral QODAY  . enoxaparin (LOVENOX) injection  40 mg Subcutaneous Q24H  . folic acid  1 mg Oral Daily  . gabapentin  100 mg Oral TID  . Ipratropium-Albuterol  1 puff Inhalation Q6H  . lamoTRIgine  50 mg Oral BID  . levETIRAcetam  500 mg Oral BID  . methylPREDNISolone (SOLU-MEDROL) injection  40 mg Intravenous Q12H  . pantoprazole  40 mg Oral BID  . propranolol  20 mg Oral BID  . QUEtiapine  25 mg Oral QHS  . rosuvastatin  20 mg Oral QHS  . zinc sulfate  220 mg Oral Daily   Continuous Infusions:    LOS: 5 days    Time spent:40 min    Damaso Laday, Geraldo Docker, MD [facility] Pager [phone]  If 7PM-7AM, please contact night-coverage [email or web address] Password [email or web address] 05/19/2020, 1:40 PM

## 2020-05-20 LAB — CBC WITH DIFFERENTIAL/PLATELET
Abs Immature Granulocytes: 0.01 10*3/uL (ref 0.00–0.07)
Basophils Absolute: 0 10*3/uL (ref 0.0–0.1)
Basophils Relative: 0 %
Eosinophils Absolute: 0 10*3/uL (ref 0.0–0.5)
Eosinophils Relative: 0 %
HCT: 29.1 % — ABNORMAL LOW (ref 36.0–46.0)
Hemoglobin: 9.2 g/dL — ABNORMAL LOW (ref 12.0–15.0)
Immature Granulocytes: 0 %
Lymphocytes Relative: 12 %
Lymphs Abs: 0.5 10*3/uL — ABNORMAL LOW (ref 0.7–4.0)
MCH: 34.2 pg — ABNORMAL HIGH (ref 26.0–34.0)
MCHC: 31.6 g/dL (ref 30.0–36.0)
MCV: 108.2 fL — ABNORMAL HIGH (ref 80.0–100.0)
Monocytes Absolute: 0.3 10*3/uL (ref 0.1–1.0)
Monocytes Relative: 8 %
Neutro Abs: 3 10*3/uL (ref 1.7–7.7)
Neutrophils Relative %: 80 %
Platelets: 301 10*3/uL (ref 150–400)
RBC: 2.69 MIL/uL — ABNORMAL LOW (ref 3.87–5.11)
RDW: 14.6 % (ref 11.5–15.5)
WBC: 3.8 10*3/uL — ABNORMAL LOW (ref 4.0–10.5)
nRBC: 0 % (ref 0.0–0.2)

## 2020-05-20 LAB — LACTATE DEHYDROGENASE: LDH: 174 U/L (ref 98–192)

## 2020-05-20 LAB — GASTROINTESTINAL PANEL BY PCR, STOOL (REPLACES STOOL CULTURE)

## 2020-05-20 LAB — FERRITIN: Ferritin: 61 ng/mL (ref 11–307)

## 2020-05-20 LAB — COMPREHENSIVE METABOLIC PANEL
ALT: 14 U/L (ref 0–44)
AST: 25 U/L (ref 15–41)
Albumin: 2.6 g/dL — ABNORMAL LOW (ref 3.5–5.0)
Alkaline Phosphatase: 70 U/L (ref 38–126)
Anion gap: 10 (ref 5–15)
BUN: 15 mg/dL (ref 6–20)
CO2: 29 mmol/L (ref 22–32)
Calcium: 8.5 mg/dL — ABNORMAL LOW (ref 8.9–10.3)
Chloride: 101 mmol/L (ref 98–111)
Creatinine, Ser: 0.57 mg/dL (ref 0.44–1.00)
GFR calc Af Amer: >60 mL/min (ref >60)
GFR calc non Af Amer: >60 mL/min (ref >60)
Glucose, Bld: 150 mg/dL — ABNORMAL HIGH (ref 70–99)
Potassium: 3 mmol/L — ABNORMAL LOW (ref 3.5–5.1)
Sodium: 140 mmol/L (ref 135–145)
Total Bilirubin: 0.7 mg/dL (ref 0.3–1.2)
Total Protein: 6 g/dL — ABNORMAL LOW (ref 6.5–8.1)

## 2020-05-20 LAB — MAGNESIUM: Magnesium: 2 mg/dL (ref 1.7–2.4)

## 2020-05-20 LAB — PHOSPHORUS: Phosphorus: 3.5 mg/dL (ref 2.5–4.6)

## 2020-05-20 LAB — LACTOFERRIN, FECAL, QUALITATIVE: Lactoferrin, Fecal, Qual: POSITIVE — AB

## 2020-05-20 LAB — C-REACTIVE PROTEIN: CRP: 1.9 mg/dL — ABNORMAL HIGH (ref <1.0)

## 2020-05-20 LAB — D-DIMER, QUANTITATIVE: D-Dimer, Quant: 0.42 ug/mL-FEU (ref 0.00–0.50)

## 2020-05-20 MED ORDER — POTASSIUM CHLORIDE CRYS ER 20 MEQ PO TBCR
50.0000 meq | EXTENDED_RELEASE_TABLET | Freq: Once | ORAL | Status: AC
  Administered 2020-05-20: 50 meq via ORAL
  Filled 2020-05-20: qty 2

## 2020-05-20 MED ORDER — POTASSIUM CHLORIDE 10 MEQ/100ML IV SOLN
10.0000 meq | INTRAVENOUS | Status: AC
  Administered 2020-05-20 (×2): 10 meq via INTRAVENOUS
  Filled 2020-05-20 (×2): qty 100

## 2020-05-20 MED ORDER — POTASSIUM CHLORIDE 10 MEQ/100ML IV SOLN
INTRAVENOUS | Status: AC
  Administered 2020-05-20: 10 meq via INTRAVENOUS
  Filled 2020-05-20: qty 100

## 2020-05-20 MED ORDER — LOPERAMIDE HCL 2 MG PO CAPS
4.0000 mg | ORAL_CAPSULE | ORAL | Status: DC | PRN
  Administered 2020-05-21: 4 mg via ORAL
  Filled 2020-05-20: qty 2

## 2020-05-20 MED ORDER — PSYLLIUM 95 % PO PACK
1.0000 | PACK | Freq: Every day | ORAL | Status: DC
  Administered 2020-05-22: 1 via ORAL
  Filled 2020-05-20 (×2): qty 1

## 2020-05-20 NOTE — Progress Notes (Signed)
PROGRESS NOTE    Brandy Gutierrez  OIN:[id number] DOB: January 24, 1961 DOA: 05/14/2020 PCP: Neale Burly, MD     Brief Narrative:  Brandy Gutierrez is a 59 y.o. WF PMHx Seizures, Memory DO, TIA x6, gait abnormality, anterior cerebral aneurysm, COPD, asthma due to seasonal allergies, fibromyalgia, HTN,.   Presenting with 5 days of dyspnea. She tested positive for COVID with a home COVID kit. 4 days ago per her report. She states that she has only try tylenol for relief since the beginning of her symptoms. It has provided no relief. She became concerned when her home pulse oximeter began registering values in the 80's. So she came to the ED. She denies any other alleviating or aggravating factors.    ED Course: Found to be COVID positive. Found to be hypoxic on RA. Started on remdes and decadron. TRH called for admission.   Review of Systems:  She reports HA and sore throat. She denies chest pain, palpitations, fever. Review of systems is otherwise negative for all not mentioned in HPI.     Subjective: 10/2 afebrile overnight A/O x4, explained the importance of maintaining telemetry, patient agreed to keep telemetry on her body.  Positive diarrhea, positive S OB    Assessment & Plan: Covid vaccination;   Active Problems:   Fibromyalgia   Memory disorder   Anterior cerebral aneurysm   Seizure disorder ([place])   COVID-19   COPD mixed type ([facility])   Acute respiratory failure with hypoxia ([facility])   Pneumonia due to COVID-19 virus   HLD (hyperlipidemia)   Hypokalemia   Hypophosphatemia   Acute Respiratory Failure with Hypoxia/ COVID 19 Pneumonia  COVID-19 Labs  Recent Labs    05/18/20 0425 05/19/20 0325 05/20/20 0525  DDIMER 0.54* 0.57* 0.42  FERRITIN 102 64   69 61  LDH 236* 184 174  CRP 13.1* 6.6* 1.9*    Lab Results  Component Value Date   SARSCOV2NAA POSITIVE (A) 05/14/2020  -Patient newly hypoxic, with elevated CRP> 7, on 3 L O2 (new). -Solu-Medrol 40 mg  BID -Remdesivir x5 days per pharmacy protocol -Respimat QID -Vitamin C and zinc per Covid protocol -Flutter valve -Incentive spirometry -Prone patient 8 hours per day; if patient cannot tolerate prone 2 to 3 hours per shift -Titrate O2 to maintain SPO2> 88% -Out of bed to chair every shift -PT; work with patient daily on strengthening mobility -10/1 PCXR; nondiagnostic see results below  Seizure DO -Lamictal 50 mg BID -Keppra 500 mg BID  Acute metabolic encephalopathy -7/09 EEG shows no seizure activity but shows diffuse encephalopathy see results below. -This could be multifactorial to include Covid, medication, electrolyte imbalance, withdrawal from multiple home medication. -We will endeavor to correct underlying imbalances. -10/1 appears to have cleared  Normocytic Anemia  -Anemia panel; most consistent with normocytic anemia   HLD -9/30 LDL = 62   Hypokalemia --Potassium goal>4 -10/2 K-Dur 50 mEq -10/2 potassium IV 30 mEq  Hypophosphatemia --Phosphorous goal> 2.5  Diarrhea -10/2 GI panel negative -10/2 Metamucil daily  -10/2 Imodium    Goals of care -9/30 Physical Therapy; awaiting SNF placement   DVT prophylaxis: Lovenox Code Status: Full  family Communication: 9/30 spoke with Dominica Severin (husband) counseled on plan of care answered all questions Status is: Inpatient    Dispo: The patient is from: Home              Anticipated d/c is to: SNF?  Anticipated d/c date is: 10/5              Patient currently       Consultants:    Procedures/Significant Events:  9/29 EEG;study is suggestive of moderate diffuse encephalopathy, nonspecific to etiology. No seizures or epileptiform discharges were seen throughout the recording. 10/1 PCXR; no active disease.   I have personally reviewed and interpreted all radiology studies and my findings are as above.  VENTILATOR SETTINGS: Nasal Cannula 10/2 Flow; 2 L/min SPO2; 96%   Cultures 10/1 GI  panel by PCR negative 10/1 lactoferrin positive   Antimicrobials: Anti-infectives (From admission, onward)   Start     Ordered Stop   05/15/20 1000  remdesivir 100 mg in sodium chloride 0.9 % 100 mL IVPB  Status:  Discontinued       "Followed by" Linked Group Details   05/14/20 1811 05/14/20 1812   05/15/20 1000  remdesivir 100 mg in sodium chloride 0.9 % 100 mL IVPB       "Followed by" Linked Group Details   05/14/20 1734 05/19/20 0959   05/14/20 1811  remdesivir 200 mg in sodium chloride 0.9% 250 mL IVPB  Status:  Discontinued       "Followed by" Linked Group Details   05/14/20 1811 05/14/20 1812   05/14/20 1745  remdesivir 200 mg in sodium chloride 0.9% 250 mL IVPB       "Followed by" Linked Group Details   05/14/20 1734 05/14/20 2029       Devices    LINES / TUBES:      Continuous Infusions:    Objective: Vitals:   05/19/20 2100 05/19/20 2345 05/20/20 0000 05/20/20 0510  BP:    126/76  Pulse:    70  Resp:    18  Temp:    98.4 F (36.9 C)  TempSrc:      SpO2: 90% (!) 89% 98% 96%  Weight:      Height:        Intake/Output Summary (Last 24 hours) at 05/20/2020 1204 Last data filed at 05/19/2020 1235 Gross per 24 hour  Intake 118 ml  Output --  Net 118 ml   Filed Weights   05/14/20 1134  Weight: 46.3 kg   Physical Exam:  General: A/O x4, positive acute respiratory distress, but improved, cachectic Eyes: negative scleral hemorrhage, negative anisocoria, negative icterus ENT: Negative Runny nose, negative gingival bleeding, Neck:  Negative scars, masses, torticollis, lymphadenopathy, JVD Lungs: rhonchi bilaterally without wheezes or crackles, productive cough (yellow) Cardiovascular: Regular rate and rhythm without murmur gallop or rub normal S1 and S2 Abdomen: negative abdominal pain, nondistended, positive soft, bowel sounds, no rebound, no ascites, no appreciable mass Extremities: No significant cyanosis, clubbing, or edema bilateral lower  extremities Skin: Negative rashes, lesions, ulcers Psychiatric:  Negative depression, negative anxiety, negative fatigue, negative mania  Central nervous system:  Cranial nerves II through XII intact, tongue/uvula midline, all extremities muscle strength 5/5, sensation intact throughout,  negative dysarthria, negative expressive aphasia, negative receptive aphasia.  .     Data Reviewed: Care during the described time interval was provided by me .  I have reviewed this patient's available data, including medical history, events of note, physical examination, and all test results as part of my evaluation.  CBC: Recent Labs  Lab 05/16/20 0343 05/17/20 0332 05/18/20 0425 05/19/20 0325 05/20/20 0525  WBC 5.8 6.1 3.8* 3.2* 3.8*  NEUTROABS 3.8 4.0 3.2 2.5 3.0  HGB 10.5* 10.7* 10.0* 9.3* 9.2*  HCT 35.3* 34.6* 32.3* 29.7* 29.1*  MCV 120.1* 111.3* 111.0* 109.2* 108.2*  PLT 180 225 233 246 314   Basic Metabolic Panel: Recent Labs  Lab 05/16/20 0343 05/17/20 0332 05/17/20 0900 05/18/20 0425 05/19/20 0325 05/20/20 0525  NA 137 137  --  136 139 140  K 4.2 3.2*  --  3.1* 4.1 3.0*  CL 96* 92*  --  91* 100 101  CO2 26 28  --  _0 GLUCOSE 70 84  --  136* 131* 150*  BUN 12 10  --  _1 CREATININE 0.61 0.57  --  0.58 0.58 0.57  CALCIUM 8.5* 8.9  --  8.5* 8.2* 8.5*  MG 2.4 1.9  --  1.9 2.0 2.0  PHOS 2.3*  --  1.5* 4.7* 2.2* 3.5   GFR: Estimated Creatinine Clearance: 56 mL/min (by C-G formula based on SCr of 0.57 mg/dL). Liver Function Tests: Recent Labs  Lab 05/16/20 0343 05/17/20 0332 05/18/20 0425 05/19/20 0325 05/20/20 0525  AST 45* 34 32 28 25  ALT _2 ALKPHOS 76 85 82 68 70  BILITOT 1.0 0.8 0.8 0.9 0.7  PROT 6.6 6.6 6.7 5.9* 6.0*  ALBUMIN 3.0* 3.1* 2.9* 2.5* 2.6*   No results for input(s): LIPASE, AMYLASE in the last 168 hours. Recent Labs  Lab 05/16/20 1236  AMMONIA 19   Coagulation Profile: No results for input(s): INR, PROTIME in the last  168 hours. Cardiac Enzymes: No results for input(s): CKTOTAL, CKMB, CKMBINDEX, TROPONINI in the last 168 hours. BNP (last 3 results) No results for input(s): PROBNP in the last 8760 hours. HbA1C: No results for input(s): HGBA1C in the last 72 hours. CBG: No results for input(s): GLUCAP in the last 168 hours. Lipid Profile: Recent Labs    05/18/20 0425  CHOL 125  HDL 52  LDLCALC 62  TRIG 53  CHOLHDL 2.4   Thyroid Function Tests: No results for input(s): TSH, T4TOTAL, FREET4, T3FREE, THYROIDAB in the last 72 hours. Anemia Panel: Recent Labs    05/19/20 0325 05/20/20 0525  VITAMINB12 1,186*  --   FOLATE 21.8  --   FERRITIN 64   69 61  TIBC 250  --   IRON 39  --   RETICCTPCT 1.7  --    Sepsis Labs: Recent Labs  Lab 05/14/20 1331  PROCALCITON 1.58  LATICACIDVEN 0.9    Recent Results (from the past 240 hour(s))  Blood Culture (routine x 2)     Status: None   Collection Time: 05/14/20  1:29 PM   Specimen: Left Antecubital; Blood  Result Value Ref Range Status   Specimen Description   Final    LEFT ANTECUBITAL Performed at [facility], [place] [address] [place], [place] [address]    Special Requests   Final    BOTTLES DRAWN AEROBIC AND ANAEROBIC Blood Culture adequate volume Performed at [facility] [address], [place], [place] [address]    Culture   Final    NO GROWTH 5 DAYS Performed at [facility], [place] [address] [place], [place] [address]    Report Status 05/19/2020 FINAL  Final  Blood Culture (routine x 2)     Status: None   Collection Time: 05/14/20  1:31 PM   Specimen: Right Antecubital; Blood  Result Value Ref Range Status   Specimen Description   Final    RIGHT ANTECUBITAL Performed at [facility], [address] [place], [place] [address]    Special Requests   Final    BOTTLES DRAWN AEROBIC AND ANAEROBIC Blood Culture adequate volume Performed at [facility] [address] [place], [place] [address]    Culture   Final    NO GROWTH 5 DAYS Performed at [facility], [place] [address], [place], [place] [address]    Report Status 05/19/2020 FINAL  Final  Resp Panel by RT PCR (RSV, Flu A&B, Covid) - Nasopharyngeal Swab     Status: Abnormal   Collection Time: 05/14/20  1:31 PM   Specimen: Nasopharyngeal Swab  Result Value Ref Range Status   SARS Coronavirus 2 by RT PCR POSITIVE (A) NEGATIVE Final    Comment: RESULT CALLED TO, READ BACK BY AND VERIFIED WITH: WILLIAM,F RN _0  ON 05/14/20 JACKSON,K (NOTE) SARS-CoV-2 target nucleic acids are DETECTED.  SARS-CoV-2 RNA is generally detectable in upper respiratory specimens  during the acute phase of infection. Positive results are indicative of the presence of the identified virus, but do not rule out bacterial infection or co-infection with other pathogens not detected by the test. Clinical correlation with patient history and other diagnostic information is necessary to determine patient infection status. The expected result is Negative.  Fact Sheet for Patients:  PinkCheek.be  Fact Sheet for Healthcare Providers: GravelBags.it  This test is not yet approved or cleared by the [place] FDA and  has been authorized for detection and/or diagnosis of SARS-CoV-2 by FDA under an Emergency Use Authorization (EUA).  This EUA will remain in effect (meaning this test ca n be used) for the duration of  the COVID-19 declaration under Section 564(b)(1) of the Act, 21 U.S.C. section 360bbb-3(b)(1), unless the authorization is terminated or revoked sooner.      Influenza A by PCR NEGATIVE NEGATIVE Final   Influenza B by PCR NEGATIVE NEGATIVE Final    Comment: (NOTE) The Xpert Xpress SARS-CoV-2/FLU/RSV assay is intended as an aid in  the diagnosis of influenza from Nasopharyngeal swab specimens and  should not be used as a  sole basis for treatment. Nasal washings and  aspirates are unacceptable for Xpert Xpress SARS-CoV-2/FLU/RSV  testing.  Fact Sheet for Patients: PinkCheek.be  Fact Sheet for Healthcare Providers: GravelBags.it  This test is not yet approved or cleared by the [place] FDA and  has been authorized for detection and/or diagnosis of SARS-CoV-2 by  FDA under an Emergency Use Authorization (EUA). This EUA will remain  in effect (meaning this test can be used) for the duration of the  Covid-19 declaration under Section 564(b)(1) of the Act, 21  U.S.C. section 360bbb-3(b)(1), unless the authorization is  terminated or revoked.    Respiratory Syncytial Virus by PCR NEGATIVE NEGATIVE Final    Comment: (NOTE) Fact Sheet for Patients: PinkCheek.be  Fact Sheet for Healthcare Providers: GravelBags.it  This test is not yet approved or cleared by the [place] FDA and  has been authorized for detection and/or diagnosis of SARS-CoV-2 by  FDA under an Emergency Use Authorization (EUA). This EUA will remain  in effect (meaning this test can be used) for the duration of the  COVID-19 declaration under Section 564(b)(1) of the Act, 21 U.S.C.  section 360bbb-3(b)(1), unless the authorization is terminated or  revoked. Performed at [facility], [place] [address], [place], [place] [address]          Radiology Studies: DG CHEST PORT 1 VIEW  Result Date: 05/19/2020 CLINICAL  DATA:  Pneumonia EXAM: PORTABLE CHEST 1 VIEW COMPARISON:  05/16/2020 FINDINGS: Normal heart size and mediastinal contours. There is prominence of the left hilum attributed to rotation given better aligned radiograph 05/14/2020. There is no edema, consolidation, effusion, or pneumothorax. IMPRESSION: No evidence of active disease. Electronically Signed   By: Monte Fantasia M.D.   On:  05/19/2020 05:50        Scheduled Meds:  vitamin C  500 mg Oral Daily   aspirin EC  81 mg Oral QHS   clopidogrel  75 mg Oral QODAY   enoxaparin (LOVENOX) injection  40 mg Subcutaneous M35T   folic acid  1 mg Oral Daily   gabapentin  100 mg Oral TID   Ipratropium-Albuterol  1 puff Inhalation Q6H   lamoTRIgine  50 mg Oral BID   levETIRAcetam  500 mg Oral BID   methylPREDNISolone (SOLU-MEDROL) injection  40 mg Intravenous Q12H   pantoprazole  40 mg Oral BID   propranolol  20 mg Oral BID   QUEtiapine  25 mg Oral QHS   rosuvastatin  20 mg Oral QHS   zinc sulfate  220 mg Oral Daily   Continuous Infusions:    LOS: 6 days    Time spent:40 min    , Geraldo Docker, MD [facility] Pager [phone]  If 7PM-7AM, please contact night-coverage [email or web address] Password [facility] 05/20/2020, 12:04 PM

## 2020-05-21 DIAGNOSIS — G894 Chronic pain syndrome: Secondary | ICD-10-CM | POA: Diagnosis present

## 2020-05-21 LAB — C-REACTIVE PROTEIN: CRP: 1.1 mg/dL — ABNORMAL HIGH (ref <1.0)

## 2020-05-21 LAB — CBC WITH DIFFERENTIAL/PLATELET
Abs Immature Granulocytes: 0.02 10*3/uL (ref 0.00–0.07)
Basophils Absolute: 0 10*3/uL (ref 0.0–0.1)
Basophils Relative: 0 %
Eosinophils Absolute: 0 10*3/uL (ref 0.0–0.5)
Eosinophils Relative: 0 %
HCT: 34.4 % — ABNORMAL LOW (ref 36.0–46.0)
Hemoglobin: 10.2 g/dL — ABNORMAL LOW (ref 12.0–15.0)
Immature Granulocytes: 0 %
Lymphocytes Relative: 11 %
Lymphs Abs: 0.6 10*3/uL — ABNORMAL LOW (ref 0.7–4.0)
MCH: 33.2 pg (ref 26.0–34.0)
MCHC: 29.7 g/dL — ABNORMAL LOW (ref 30.0–36.0)
MCV: 112.1 fL — ABNORMAL HIGH (ref 80.0–100.0)
Monocytes Absolute: 0.5 10*3/uL (ref 0.1–1.0)
Monocytes Relative: 8 %
Neutro Abs: 4.6 10*3/uL (ref 1.7–7.7)
Neutrophils Relative %: 81 %
Platelets: 418 10*3/uL — ABNORMAL HIGH (ref 150–400)
RBC: 3.07 MIL/uL — ABNORMAL LOW (ref 3.87–5.11)
RDW: 14.9 % (ref 11.5–15.5)
WBC: 5.8 10*3/uL (ref 4.0–10.5)
nRBC: 0 % (ref 0.0–0.2)

## 2020-05-21 LAB — COMPREHENSIVE METABOLIC PANEL
ALT: 14 U/L (ref 0–44)
AST: 23 U/L (ref 15–41)
Albumin: 2.8 g/dL — ABNORMAL LOW (ref 3.5–5.0)
Alkaline Phosphatase: 76 U/L (ref 38–126)
Anion gap: 8 (ref 5–15)
BUN: 14 mg/dL (ref 6–20)
CO2: 26 mmol/L (ref 22–32)
Calcium: 9.1 mg/dL (ref 8.9–10.3)
Chloride: 103 mmol/L (ref 98–111)
Creatinine, Ser: 0.62 mg/dL (ref 0.44–1.00)
GFR calc Af Amer: >60 mL/min (ref >60)
GFR calc non Af Amer: >60 mL/min (ref >60)
Glucose, Bld: 133 mg/dL — ABNORMAL HIGH (ref 70–99)
Potassium: 4.7 mmol/L (ref 3.5–5.1)
Sodium: 137 mmol/L (ref 135–145)
Total Bilirubin: 0.5 mg/dL (ref 0.3–1.2)
Total Protein: 6.4 g/dL — ABNORMAL LOW (ref 6.5–8.1)

## 2020-05-21 LAB — MAGNESIUM: Magnesium: 2.3 mg/dL (ref 1.7–2.4)

## 2020-05-21 LAB — LACTATE DEHYDROGENASE: LDH: 190 U/L (ref 98–192)

## 2020-05-21 LAB — D-DIMER, QUANTITATIVE: D-Dimer, Quant: 0.49 ug/mL-FEU (ref 0.00–0.50)

## 2020-05-21 LAB — FERRITIN: Ferritin: 49 ng/mL (ref 11–307)

## 2020-05-21 LAB — PHOSPHORUS: Phosphorus: 3.3 mg/dL (ref 2.5–4.6)

## 2020-05-21 MED ORDER — TRAMADOL HCL 50 MG PO TABS
50.0000 mg | ORAL_TABLET | Freq: Three times a day (TID) | ORAL | Status: DC | PRN
  Administered 2020-05-21 – 2020-05-22 (×3): 50 mg via ORAL
  Filled 2020-05-21 (×3): qty 1

## 2020-05-21 NOTE — Progress Notes (Signed)
At start of shift pt in chair, states she was in chair most of the day. 4L at 97%. Writer decreased oxygen to 2.5 then 1.5 at 0200. Pt sustaining at 97%. Pt positioned to L and R while at rest. Will continue to monitor.

## 2020-05-21 NOTE — Progress Notes (Signed)
PROGRESS NOTE    Brandy Gutierrez  BEM:[id number] DOB: 09/17/1960 DOA: 05/14/2020 PCP: Neale Burly, MD     Brief Narrative:  Brandy Gutierrez is a 59 y.o. WF PMHx Seizures, Memory DO, TIA x6, gait abnormality, anterior cerebral aneurysm, COPD, asthma due to seasonal allergies, fibromyalgia, HTN,.   Presenting with 5 days of dyspnea. She tested positive for COVID with a home COVID kit. 4 days ago per her report. She states that she has only try tylenol for relief since the beginning of her symptoms. It has provided no relief. She became concerned when her home pulse oximeter began registering values in the 80's. So she came to the ED. She denies any other alleviating or aggravating factors.    ED Course: Found to be COVID positive. Found to be hypoxic on RA. Started on remdes and decadron. TRH called for admission.   Review of Systems:  She reports HA and sore throat. She denies chest pain, palpitations, fever. Review of systems is otherwise negative for all not mentioned in HPI.     Subjective: 10/3 afebrile overnight A/O x4, sitting in bed, states diarrhea has not improved.  Requests more pain medication than Tylenol secondary to chronic shoulder pain.   Assessment & Plan: Covid vaccination;   Active Problems:   Fibromyalgia   Memory disorder   Anterior cerebral aneurysm   Seizure disorder ([place])   COVID-19   COPD mixed type ([place])   Acute respiratory failure with hypoxia (HCC)   Pneumonia due to COVID-19 virus   HLD (hyperlipidemia)   Hypokalemia   Hypophosphatemia   Acute Respiratory Failure with Hypoxia/ COVID 19 Pneumonia  COVID-19 Labs  Recent Labs    05/19/20 0325 05/20/20 0525 05/21/20 0524  DDIMER 0.57* 0.42 0.49  FERRITIN 64  69 61 49  LDH 184 174 190  CRP 6.6* 1.9* 1.1*    Lab Results  Component Value Date   SARSCOV2NAA POSITIVE (A) 05/14/2020  -Patient newly hypoxic, with elevated CRP> 7, on 3 L O2 (new). -Solu-Medrol 40 mg  BID -Remdesivir x5 days per pharmacy protocol -Respimat QID -Vitamin C and zinc per Covid protocol -Flutter valve -Incentive spirometry -Prone patient 8 hours per day; if patient cannot tolerate prone 2 to 3 hours per shift -Titrate O2 to maintain SPO2> 88% -Out of bed to chair every shift -PT; work with patient daily on strengthening mobility -10/1 PCXR; nondiagnostic see results below  Seizure DO -Lamictal 50 mg BID -Keppra 500 mg BID  Acute metabolic encephalopathy -0/71 EEG shows no seizure activity but shows diffuse encephalopathy see results below. -This could be multifactorial to include Covid, medication, electrolyte imbalance, withdrawal from multiple home medication. -We will endeavor to correct underlying imbalances. -10/1 appears to have cleared  Normocytic Anemia  -Anemia panel; most consistent with normocytic anemia   HLD -9/30 LDL = 62   Hypokalemia --Potassium goal>4 -10/2 K-Dur 50 mEq -10/2 potassium IV 30 mEq -10/3 resolved  Hypophosphatemia --Phosphorous goal> 2.5  Diarrhea -10/2 GI panel negative -10/2 Metamucil daily  -10/2 Imodium  Chronic Pain Syndrome --Tylenol --Tramadol 50 mg TID PRN    Goals of care -9/30 Physical Therapy; awaiting SNF placement   DVT prophylaxis: Lovenox Code Status: Full  family Communication: 10/3 spoke with Dominica Severin (husband) counseled on plan of care answered all questions Status is: Inpatient    Dispo: The patient is from: Home              Anticipated d/c is to: SNF?  Anticipated d/c date is: 10/5              Patient currently       Consultants:    Procedures/Significant Events:  9/29 EEG;study is suggestive of moderate diffuse encephalopathy, nonspecific to etiology. No seizures or epileptiform discharges were seen throughout the recording. 10/1 PCXR; no active disease.   I have personally reviewed and interpreted all radiology studies and my findings are as above.  VENTILATOR  SETTINGS: Room air 10/3 SPO2; 94%   Cultures 10/1 GI panel by PCR negative 10/1 lactoferrin positive   Antimicrobials: Anti-infectives (From admission, onward)   Start     Ordered Stop   05/15/20 1000  remdesivir 100 mg in sodium chloride 0.9 % 100 mL IVPB  Status:  Discontinued       "Followed by" Linked Group Details   05/14/20 1811 05/14/20 1812   05/15/20 1000  remdesivir 100 mg in sodium chloride 0.9 % 100 mL IVPB       "Followed by" Linked Group Details   05/14/20 1734 05/19/20 0959   05/14/20 1811  remdesivir 200 mg in sodium chloride 0.9% 250 mL IVPB  Status:  Discontinued       "Followed by" Linked Group Details   05/14/20 1811 05/14/20 1812   05/14/20 1745  remdesivir 200 mg in sodium chloride 0.9% 250 mL IVPB       "Followed by" Linked Group Details   05/14/20 1734 05/14/20 2029       Devices    LINES / TUBES:      Continuous Infusions:    Objective: Vitals:   05/20/20 0510 05/20/20 1354 05/20/20 2017 05/21/20 0507  BP: 126/76 123/78 (!) 137/91 105/80  Pulse: 70 79 85 82  Resp: 18 20 (!) 21 (!) 22  Temp: 98.4 F (36.9 C) 98.7 F (37.1 C) 98.5 F (36.9 C) 98.4 F (36.9 C)  TempSrc:   Oral Oral  SpO2: 96% 96% 100% (!) 85%  Weight:      Height:        Intake/Output Summary (Last 24 hours) at 05/21/2020 7124 Last data filed at 05/21/2020 5809 Gross per 24 hour  Intake 286.55 ml  Output --  Net 286.55 ml   Filed Weights   05/14/20 1134  Weight: 46.3 kg    Physical Exam:  General: A/O x4, No acute respiratory distress, cachectic Eyes: negative scleral hemorrhage, negative anisocoria, negative icterus ENT: Negative Runny nose, negative gingival bleeding, Neck:  Negative scars, masses, torticollis, lymphadenopathy, JVD Lungs: Clear to auscultation bilaterally without wheezes or crackles Cardiovascular: Regular rate and rhythm without murmur gallop or rub normal S1 and S2 Abdomen: negative abdominal pain, nondistended, positive soft, bowel  sounds, no rebound, no ascites, no appreciable mass Extremities: No significant cyanosis, clubbing, or edema bilateral lower extremities Skin: Negative rashes, lesions, ulcers Psychiatric:  Negative depression, negative anxiety, negative fatigue, negative mania  Central nervous system:  Cranial nerves II through XII intact, tongue/uvula midline, all extremities muscle strength 5/5, sensation intact throughout, negative dysarthria, negative expressive aphasia, negative receptive aphasia.       Data Reviewed: Care during the described time interval was provided by me .  I have reviewed this patient's available data, including medical history, events of note, physical examination, and all test results as part of my evaluation.  CBC: Recent Labs  Lab 05/17/20 0332 05/18/20 0425 05/19/20 0325 05/20/20 0525 05/21/20 0524  WBC 6.1 3.8* 3.2* 3.8* 5.8  NEUTROABS 4.0 3.2 2.5 3.0 4.6  HGB 10.7* 10.0* 9.3* 9.2* 10.2*  HCT 34.6* 32.3* 29.7* 29.1* 34.4*  MCV 111.3* 111.0* 109.2* 108.2* 112.1*  PLT 225 233 246 301 696*   Basic Metabolic Panel: Recent Labs  Lab 05/16/20 0343 05/17/20 0332 05/17/20 0900 05/18/20 0425 05/19/20 0325 05/20/20 0525 05/21/20 0524  NA  --  137  --  136 139 140 137  K  --  3.2*  --  3.1* 4.1 3.0* 4.7  CL  --  92*  --  91* 100 101 103  CO2  --  28  --  _0 GLUCOSE  --  84  --  136* 131* 150* 133*  BUN  --  10  --  _1 CREATININE  --  0.57  --  0.58 0.58 0.57 0.62  CALCIUM  --  8.9  --  8.5* 8.2* 8.5* 9.1  MG  --  1.9  --  1.9 2.0 2.0 2.3  PHOS   < >  --  1.5* 4.7* 2.2* 3.5 3.3   < > = values in this interval not displayed.   GFR: Estimated Creatinine Clearance: 56 mL/min (by C-G formula based on SCr of 0.62 mg/dL). Liver Function Tests: Recent Labs  Lab 05/17/20 0332 05/18/20 0425 05/19/20 0325 05/20/20 0525 05/21/20 0524  AST 34 32 _2 ALT _3 ALKPHOS 85 82 68 70 76  BILITOT 0.8 0.8 0.9 0.7 0.5  PROT 6.6 6.7  5.9* 6.0* 6.4*  ALBUMIN 3.1* 2.9* 2.5* 2.6* 2.8*   No results for input(s): LIPASE, AMYLASE in the last 168 hours. Recent Labs  Lab 05/16/20 1236  AMMONIA 19   Coagulation Profile: No results for input(s): INR, PROTIME in the last 168 hours. Cardiac Enzymes: No results for input(s): CKTOTAL, CKMB, CKMBINDEX, TROPONINI in the last 168 hours. BNP (last 3 results) No results for input(s): PROBNP in the last 8760 hours. HbA1C: No results for input(s): HGBA1C in the last 72 hours. CBG: No results for input(s): GLUCAP in the last 168 hours. Lipid Profile: No results for input(s): CHOL, HDL, LDLCALC, TRIG, CHOLHDL, LDLDIRECT in the last 72 hours. Thyroid Function Tests: No results for input(s): TSH, T4TOTAL, FREET4, T3FREE, THYROIDAB in the last 72 hours. Anemia Panel: Recent Labs    05/19/20 0325 05/19/20 0325 05/20/20 0525 05/21/20 0524  [id number] 1,186*  --   --   --   FOLATE 21.8  --   --   --   FERRITIN 64  69   < > 61 49  TIBC 250  --   --   --   IRON 39  --   --   --   RETICCTPCT 1.7  --   --   --    < > = values in this interval not displayed.   Sepsis Labs: Recent Labs  Lab 05/14/20 1331  PROCALCITON 1.58  LATICACIDVEN 0.9    Recent Results (from the past 240 hour(s))  Blood Culture (routine x 2)     Status: None   Collection Time: 05/14/20  1:29 PM   Specimen: Left Antecubital; Blood  Result Value Ref Range Status   Specimen Description   Final    LEFT ANTECUBITAL Performed at [facility] [address] [place], [place] [address]    Special Requests   Final    BOTTLES DRAWN AEROBIC AND ANAEROBIC Blood Culture adequate volume Performed at [facility]  [place], [place] [address]    Culture   Final    NO GROWTH 5 DAYS Performed at [facility], [place] [address], [place], [place] [address]    Report Status 05/19/2020 FINAL  Final  Blood Culture (routine x 2)     Status: None   Collection  Time: 05/14/20  1:31 PM   Specimen: Right Antecubital; Blood  Result Value Ref Range Status   Specimen Description   Final    RIGHT ANTECUBITAL Performed at [facility] [address], [place], [place] [address]    Special Requests   Final    BOTTLES DRAWN AEROBIC AND ANAEROBIC Blood Culture adequate volume Performed at [facility] [address], [place], [place] [address]    Culture   Final    NO GROWTH 5 DAYS Performed at [facility], [place] [address] [place], [place] [address]    Report Status 05/19/2020 FINAL  Final  Resp Panel by RT PCR (RSV, Flu A&B, Covid) - Nasopharyngeal Swab     Status: Abnormal   Collection Time: 05/14/20  1:31 PM   Specimen: Nasopharyngeal Swab  Result Value Ref Range Status   SARS Coronavirus 2 by RT PCR POSITIVE (A) NEGATIVE Final    Comment: RESULT CALLED TO, READ BACK BY AND VERIFIED WITH: WILLIAM,F RN _0  ON 05/14/20 JACKSON,K (NOTE) SARS-CoV-2 target nucleic acids are DETECTED.  SARS-CoV-2 RNA is generally detectable in upper respiratory specimens  during the acute phase of infection. Positive results are indicative of the presence of the identified virus, but do not rule out bacterial infection or co-infection with other pathogens not detected by the test. Clinical correlation with patient history and other diagnostic information is necessary to determine patient infection status. The expected result is Negative.  Fact Sheet for Patients:  PinkCheek.be  Fact Sheet for Healthcare Providers: GravelBags.it  This test is not yet approved or cleared by the [place] FDA and  has been authorized for detection and/or diagnosis of SARS-CoV-2 by FDA under an Emergency Use Authorization (EUA).  This EUA will remain in effect (meaning this test ca n be used) for the duration of  the COVID-19 declaration under Section 564(b)(1) of the Act,  21 U.S.C. section 360bbb-3(b)(1), unless the authorization is terminated or revoked sooner.      Influenza A by PCR NEGATIVE NEGATIVE Final   Influenza B by PCR NEGATIVE NEGATIVE Final    Comment: (NOTE) The Xpert Xpress SARS-CoV-2/FLU/RSV assay is intended as an aid in  the diagnosis of influenza from Nasopharyngeal swab specimens and  should not be used as a sole basis for treatment. Nasal washings and  aspirates are unacceptable for Xpert Xpress SARS-CoV-2/FLU/RSV  testing.  Fact Sheet for Patients: PinkCheek.be  Fact Sheet for Healthcare Providers: GravelBags.it  This test is not yet approved or cleared by the [place] FDA and  has been authorized for detection and/or diagnosis of SARS-CoV-2 by  FDA under an Emergency Use Authorization (EUA). This EUA will remain  in effect (meaning this test can be used) for the duration of the  Covid-19 declaration under Section 564(b)(1) of the Act, 21  U.S.C. section 360bbb-3(b)(1), unless the authorization is  terminated or revoked.    Respiratory Syncytial Virus by PCR NEGATIVE NEGATIVE Final    Comment: (NOTE) Fact Sheet for Patients: PinkCheek.be  Fact Sheet for Healthcare Providers: GravelBags.it  This test is not yet approved or cleared by the [place] FDA and  has  been authorized for detection and/or diagnosis of SARS-CoV-2 by  FDA under an Emergency Use Authorization (EUA). This EUA will remain  in effect (meaning this test can be used) for the duration of the  COVID-19 declaration under Section 564(b)(1) of the Act, 21 U.S.C.  section 360bbb-3(b)(1), unless the authorization is terminated or  revoked. Performed at [facility], [place] [address], [place], [place] [address]   Gastrointestinal Panel by PCR , Stool     Status: None   Collection Time: 05/19/20  3:06 PM   Specimen:  Stool  Result Value Ref Range Status   Campylobacter species NOT DETECTED NOT DETECTED Final   Plesimonas shigelloides NOT DETECTED NOT DETECTED Final   Salmonella species NOT DETECTED NOT DETECTED Final   Yersinia enterocolitica NOT DETECTED NOT DETECTED Final   Vibrio species NOT DETECTED NOT DETECTED Final   Vibrio cholerae NOT DETECTED NOT DETECTED Final   Enteroaggregative E coli (EAEC) NOT DETECTED NOT DETECTED Final   Enteropathogenic E coli (EPEC) NOT DETECTED NOT DETECTED Final   Enterotoxigenic E coli (ETEC) NOT DETECTED NOT DETECTED Final   Shiga like toxin producing E coli (STEC) NOT DETECTED NOT DETECTED Final   Shigella/Enteroinvasive E coli (EIEC) NOT DETECTED NOT DETECTED Final   Cryptosporidium NOT DETECTED NOT DETECTED Final   Cyclospora cayetanensis NOT DETECTED NOT DETECTED Final   Entamoeba histolytica NOT DETECTED NOT DETECTED Final   Giardia lamblia NOT DETECTED NOT DETECTED Final   Adenovirus F40/41 NOT DETECTED NOT DETECTED Final   Astrovirus NOT DETECTED NOT DETECTED Final   Norovirus GI/GII NOT DETECTED NOT DETECTED Final   Rotavirus A NOT DETECTED NOT DETECTED Final   Sapovirus (I, II, IV, and V) NOT DETECTED NOT DETECTED Final    Comment: Performed at [facility], [address] [place], [place] [address]         Radiology Studies: No results found.      Scheduled Meds: . vitamin C  500 mg Oral Daily  . aspirin EC  81 mg Oral QHS  . clopidogrel  75 mg Oral QODAY  . enoxaparin (LOVENOX) injection  40 mg Subcutaneous Q24H  . folic acid  1 mg Oral Daily  . gabapentin  100 mg Oral TID  . Ipratropium-Albuterol  1 puff Inhalation Q6H  . lamoTRIgine  50 mg Oral BID  . levETIRAcetam  500 mg Oral BID  . methylPREDNISolone (SOLU-MEDROL) injection  40 mg Intravenous Q12H  . pantoprazole  40 mg Oral BID  . propranolol  20 mg Oral BID  . psyllium  1 packet Oral Daily  . QUEtiapine  25 mg Oral QHS  . rosuvastatin  20 mg Oral QHS  . zinc  sulfate  220 mg Oral Daily   Continuous Infusions:    LOS: 7 days    Time spent:40 min    Woodrow Drab, Geraldo Docker, MD [facility] Pager [phone]  If 7PM-7AM, please contact night-coverage [email or web address] Password [email or web address] 05/21/2020, 9:53 AM

## 2020-05-21 NOTE — TOC Progression Note (Signed)
Transition of Care [facility]) - Progression Note    Patient Details  Name: Brandy Gutierrez MRN: [id number] Date of Birth: Jun 26, 1961  Transition of Care [facility]) CM/SW [place], [place] Phone Number: 05/21/2020, 11:56 AM  Clinical Narrative:   No SNF facilities have yet accepted patient for rehab. LCSW unable to provide list of approved SNF's since none have approved and are all still pending patient's referral/FL2.    Expected Discharge Plan: [place] Barriers to Discharge: Continued Medical Work up  Expected Discharge Plan and Services Expected Discharge Plan: [place]   Discharge Planning Services: CM Consult Post Acute Care Choice: [place] Living arrangements for the past 2 months: Single Family Home                 DME Arranged: N/A DME Agency: NA       HH Arranged: NA    Social Determinants of Health (SDOH) Interventions    Readmission Risk Interventions No flowsheet data found.

## 2020-05-21 NOTE — Progress Notes (Signed)
Pt entered the hospital 7 days ago with a bandage on her Left Big toe. This had not been assessed since I did tonight. Pt reports an ongoing issue with this toe from a traumatic injury 1 year ago.  When this bandage was removed- dry eschar & dead skin came off. This skin breakdown is almost circumferential.  Area was cleaned with saline gauze, petroleum gauze & 2x2 gauze reapplied with paper tape. Pt would benefit from a [place] visit since pt is being DCed to SNF soon.

## 2020-05-22 DIAGNOSIS — R498 Other voice and resonance disorders: Secondary | ICD-10-CM | POA: Diagnosis not present

## 2020-05-22 DIAGNOSIS — G8929 Other chronic pain: Secondary | ICD-10-CM | POA: Diagnosis not present

## 2020-05-22 DIAGNOSIS — R279 Unspecified lack of coordination: Secondary | ICD-10-CM | POA: Diagnosis not present

## 2020-05-22 DIAGNOSIS — R2681 Unsteadiness on feet: Secondary | ICD-10-CM | POA: Diagnosis not present

## 2020-05-22 DIAGNOSIS — G894 Chronic pain syndrome: Secondary | ICD-10-CM | POA: Diagnosis not present

## 2020-05-22 DIAGNOSIS — U071 COVID-19: Secondary | ICD-10-CM | POA: Diagnosis not present

## 2020-05-22 DIAGNOSIS — J9621 Acute and chronic respiratory failure with hypoxia: Secondary | ICD-10-CM | POA: Diagnosis not present

## 2020-05-22 DIAGNOSIS — R413 Other amnesia: Secondary | ICD-10-CM

## 2020-05-22 DIAGNOSIS — R059 Cough, unspecified: Secondary | ICD-10-CM | POA: Diagnosis not present

## 2020-05-22 DIAGNOSIS — E785 Hyperlipidemia, unspecified: Secondary | ICD-10-CM | POA: Diagnosis not present

## 2020-05-22 DIAGNOSIS — M797 Fibromyalgia: Secondary | ICD-10-CM | POA: Diagnosis not present

## 2020-05-22 DIAGNOSIS — J9600 Acute respiratory failure, unspecified whether with hypoxia or hypercapnia: Secondary | ICD-10-CM | POA: Diagnosis not present

## 2020-05-22 DIAGNOSIS — G9341 Metabolic encephalopathy: Secondary | ICD-10-CM | POA: Diagnosis not present

## 2020-05-22 DIAGNOSIS — L97529 Non-pressure chronic ulcer of other part of left foot with unspecified severity: Secondary | ICD-10-CM | POA: Diagnosis not present

## 2020-05-22 DIAGNOSIS — J129 Viral pneumonia, unspecified: Secondary | ICD-10-CM

## 2020-05-22 DIAGNOSIS — J449 Chronic obstructive pulmonary disease, unspecified: Secondary | ICD-10-CM

## 2020-05-22 DIAGNOSIS — J9601 Acute respiratory failure with hypoxia: Secondary | ICD-10-CM | POA: Diagnosis not present

## 2020-05-22 DIAGNOSIS — R569 Unspecified convulsions: Secondary | ICD-10-CM | POA: Diagnosis not present

## 2020-05-22 DIAGNOSIS — J1282 Pneumonia due to coronavirus disease 2019: Secondary | ICD-10-CM | POA: Diagnosis not present

## 2020-05-22 DIAGNOSIS — B9732 Oncovirus as the cause of diseases classified elsewhere: Secondary | ICD-10-CM | POA: Diagnosis not present

## 2020-05-22 DIAGNOSIS — M6259 Muscle wasting and atrophy, not elsewhere classified, multiple sites: Secondary | ICD-10-CM | POA: Diagnosis not present

## 2020-05-22 DIAGNOSIS — E782 Mixed hyperlipidemia: Secondary | ICD-10-CM | POA: Diagnosis not present

## 2020-05-22 DIAGNOSIS — Z743 Need for continuous supervision: Secondary | ICD-10-CM | POA: Diagnosis not present

## 2020-05-22 DIAGNOSIS — M6281 Muscle weakness (generalized): Secondary | ICD-10-CM | POA: Diagnosis not present

## 2020-05-22 LAB — CBC WITH DIFFERENTIAL/PLATELET
Abs Immature Granulocytes: 0.02 10*3/uL (ref 0.00–0.07)
Basophils Absolute: 0 10*3/uL (ref 0.0–0.1)
Basophils Relative: 0 %
Eosinophils Absolute: 0 10*3/uL (ref 0.0–0.5)
Eosinophils Relative: 1 %
HCT: 35.8 % — ABNORMAL LOW (ref 36.0–46.0)
Hemoglobin: 11 g/dL — ABNORMAL LOW (ref 12.0–15.0)
Immature Granulocytes: 0 %
Lymphocytes Relative: 18 %
Lymphs Abs: 1.5 10*3/uL (ref 0.7–4.0)
MCH: 33.2 pg (ref 26.0–34.0)
MCHC: 30.7 g/dL (ref 30.0–36.0)
MCV: 108.2 fL — ABNORMAL HIGH (ref 80.0–100.0)
Monocytes Absolute: 0.8 10*3/uL (ref 0.1–1.0)
Monocytes Relative: 10 %
Neutro Abs: 6.1 10*3/uL (ref 1.7–7.7)
Neutrophils Relative %: 71 %
Platelets: 382 10*3/uL (ref 150–400)
RBC: 3.31 MIL/uL — ABNORMAL LOW (ref 3.87–5.11)
RDW: 14.6 % (ref 11.5–15.5)
WBC: 8.5 10*3/uL (ref 4.0–10.5)
nRBC: 0 % (ref 0.0–0.2)

## 2020-05-22 LAB — COMPREHENSIVE METABOLIC PANEL
ALT: 14 U/L (ref 0–44)
AST: 31 U/L (ref 15–41)
Albumin: 2.9 g/dL — ABNORMAL LOW (ref 3.5–5.0)
Alkaline Phosphatase: 71 U/L (ref 38–126)
Anion gap: 10 (ref 5–15)
BUN: 12 mg/dL (ref 6–20)
CO2: 27 mmol/L (ref 22–32)
Calcium: 9.5 mg/dL (ref 8.9–10.3)
Chloride: 98 mmol/L (ref 98–111)
Creatinine, Ser: 0.58 mg/dL (ref 0.44–1.00)
GFR calc Af Amer: >60 mL/min (ref >60)
GFR calc non Af Amer: >60 mL/min (ref >60)
Glucose, Bld: 85 mg/dL (ref 70–99)
Potassium: 3.8 mmol/L (ref 3.5–5.1)
Sodium: 135 mmol/L (ref 135–145)
Total Bilirubin: 0.6 mg/dL (ref 0.3–1.2)
Total Protein: 6.3 g/dL — ABNORMAL LOW (ref 6.5–8.1)

## 2020-05-22 LAB — D-DIMER, QUANTITATIVE: D-Dimer, Quant: 0.39 ug/mL-FEU (ref 0.00–0.50)

## 2020-05-22 LAB — LACTATE DEHYDROGENASE: LDH: 188 U/L (ref 98–192)

## 2020-05-22 LAB — FERRITIN: Ferritin: 46 ng/mL (ref 11–307)

## 2020-05-22 LAB — C-REACTIVE PROTEIN: CRP: 0.7 mg/dL (ref <1.0)

## 2020-05-22 LAB — PHOSPHORUS: Phosphorus: 3.2 mg/dL (ref 2.5–4.6)

## 2020-05-22 LAB — MAGNESIUM: Magnesium: 2 mg/dL (ref 1.7–2.4)

## 2020-05-22 MED ORDER — ONDANSETRON HCL 4 MG PO TABS: 4.0000 mg | ORAL_TABLET | Freq: Four times a day (QID) | ORAL | 0 refills | Status: AC | PRN

## 2020-05-22 MED ORDER — PREDNISONE 50 MG PO TABS
50.0000 mg | ORAL_TABLET | Freq: Every day | ORAL | 0 refills | Status: DC
Start: 2020-05-23 — End: 2020-09-13

## 2020-05-22 MED ORDER — ZINC SULFATE 220 (50 ZN) MG PO CAPS
220.0000 mg | ORAL_CAPSULE | Freq: Every day | ORAL | 0 refills | Status: DC
Start: 2020-05-23 — End: 2020-09-13

## 2020-05-22 MED ORDER — GABAPENTIN 100 MG PO CAPS: 100.0000 mg | ORAL_CAPSULE | Freq: Three times a day (TID) | ORAL | 0 refills | Status: AC

## 2020-05-22 MED ORDER — LOPERAMIDE HCL 2 MG PO CAPS: 4.0000 mg | ORAL_CAPSULE | ORAL | 0 refills | Status: DC | PRN

## 2020-05-22 MED ORDER — IPRATROPIUM-ALBUTEROL 20-100 MCG/ACT IN AERS: 1.0000 | INHALATION_SPRAY | Freq: Four times a day (QID) | RESPIRATORY_TRACT | 0 refills | Status: AC | PRN

## 2020-05-22 MED ORDER — ACETAMINOPHEN 325 MG PO TABS: 650.0000 mg | ORAL_TABLET | Freq: Four times a day (QID) | ORAL | 0 refills | Status: AC | PRN

## 2020-05-22 MED ORDER — ASCORBIC ACID 500 MG PO TABS: 500.0000 mg | ORAL_TABLET | Freq: Every day | ORAL | 0 refills | Status: AC

## 2020-05-22 MED ORDER — IPRATROPIUM-ALBUTEROL 20-100 MCG/ACT IN AERS: 1.0000 | INHALATION_SPRAY | Freq: Four times a day (QID) | RESPIRATORY_TRACT | Status: DC | PRN

## 2020-05-22 MED ORDER — PSYLLIUM 95 % PO PACK: 1.0000 | PACK | Freq: Every day | ORAL | 0 refills | Status: AC

## 2020-05-22 MED ORDER — PREDNISONE 20 MG PO TABS: 50.0000 mg | ORAL_TABLET | Freq: Every day | ORAL | Status: DC

## 2020-05-22 NOTE — Progress Notes (Signed)
Pt does well on room air during the day but desats to 88% when asleep, placed on O2 @ 2lnc and sats rise to >95%

## 2020-05-22 NOTE — TOC Transition Note (Signed)
Transition of Care [facility]) - CM/SW Discharge Note   Patient Details  Name: DESSA LEDEE MRN: [id number] Date of Birth: [birth year]  Transition [facility] Care [facility]) CM/SW Contact:  Trish Mage, LCSW Phone Number: 05/22/2020, 11:52 AM   Clinical Narrative:   Patient to transfer to [facility] today.  Family alerted. SSN [id number].  PTAR arranged. [employer] reference # [id number], effective today through the 6th.  Nursing, please call report to [phone], room 806A. TOC sign off.    Final next level of care: Skilled Nursing Facility Barriers to Discharge: Barriers Resolved   Patient Goals and CMS Choice Patient states their goals for this hospitalization and ongoing recovery are:: to go to rehab CMS Medicare.gov Compare Post Acute Care list provided to:: Patient Represenative (must comment) Choice offered to / list presented to : Spouse  Discharge Placement                       Discharge Plan and Services   Discharge Planning Services: CM Consult Post Acute Care Choice: [place]          DME Arranged: N/A DME Agency: NA       HH Arranged: NA          Social Determinants of Health (SDOH) Interventions     Readmission Risk Interventions No flowsheet data found.

## 2020-05-22 NOTE — Discharge Summary (Signed)
Physician Discharge Summary  Brandy Gutierrez:[id number] DOB: 1960/12/28 DOA: 05/14/2020  PCP: Neale Burly, MD  Admit date: 05/14/2020 Discharge date: 05/22/2020  Time spent: 35 minutes  Recommendations for Outpatient Follow-up:   Acute Respiratory Failure with Hypoxia/ COVID 19 Pneumonia  COVID-19 Labs  Recent Labs    05/20/20 0525 05/21/20 0524 05/22/20 0446  DDIMER 0.42 0.49 0.39  FERRITIN 61 49 46  LDH 174 190 188  CRP 1.9* 1.1* 0.7    Lab Results  Component Value Date   SARSCOV2NAA POSITIVE (A) 05/14/2020   -Patient newly hypoxic, with elevated CRP> 7, on 3 L O2 (new). -Solu-Medrol 40 mg BID--> Prednisone 50 mg daily to complete her 10-day course of steroids -Remdesivir x5 days per pharmacy protocol -Respimat QID -Vitamin C and zinc per Covid protocol -Flutter valve -Incentive spirometry -Titrate O2 to maintain SPO2> 88% -Patient will be considered contagious until 06/04/2020 and should take the below precautions to protect herself and family.  Seizure DO -Lamictal 50 mg BID -Keppra 500 mg BID  Acute metabolic encephalopathy -8/31 EEG shows no seizure activity but shows diffuse encephalopathy see results below. -This could be multifactorial to include Covid, medication, electrolyte imbalance, withdrawal from multiple home medication. -10/1 appears to have cleared  Normocytic Anemia  -Anemia panel; most consistent with normocytic anemia   HLD -9/30 LDL = 62   Hypokalemia --Potassium goal>4  Hypophosphatemia --Phosphorous goal> 2.5  Diarrhea -10/2 GI panel negative -10/2 Metamucil daily  -10/2 Imodium  Chronic Pain Syndrome --Tylenol --Tramadol 50 mg TID PRN    Discharge Diagnoses:  Active Problems:   Fibromyalgia   Memory disorder   Anterior cerebral aneurysm   Seizure disorder ([place])   COVID-19   COPD mixed type ([place])   Acute respiratory failure with hypoxia ([facility])   Pneumonia due to COVID-19 virus   HLD  (hyperlipidemia)   Hypokalemia   Hypophosphatemia   Chronic pain syndrome   Discharge Condition: Stable  Diet recommendation: Heart healthy  Filed Weights   05/14/20 1134  Weight: 46.3 kg    History of present illness:  Brandy Gutierrez a 59 y.o. WF PMHx Seizures, Memory DO, TIA x6, gait abnormality, anterior cerebral aneurysm, COPD, asthma due to seasonal allergies, fibromyalgia, HTN,.   Presenting with 5 days of dyspnea. She tested positive for COVIDwith a home COVID kit.4 days ago per her report. She states that she has only try tylenol for relief since the beginning of her symptoms. It has provided no relief. She became concerned when her home pulse oximeter began registering values in the 80's. So she came to the ED. She denies any other alleviating or aggravating factors.  ED Course:Found to be COVID positive. Found to be hypoxic on RA. Started on remdes and decadron. TRH called for admission.  Review of Systems:She reports HA and sore throat. She denies chest pain, palpitations, fever.Review of systems is otherwise negative for all not mentioned in HPI.    Hospital Course:  See above  Procedures: 9/29 EEG;study issuggestive of moderate diffuse encephalopathy, nonspecific to etiology.No seizures or epileptiform discharges were seen throughout the recording. 10/1 PCXR; no active disease   Ventilation support Room air 10/4 SPO2 92%  Cultures   9/26 SARS coronavirus positive 10/1 GI panel by PCR negative 10/1 lactoferrin positive  Antibiotics Anti-infectives (From admission, onward)   Start     Ordered Stop   05/15/20 1000  remdesivir 100 mg in sodium chloride 0.9 % 100 mL IVPB  Status:  Discontinued       "  Followed by" Linked Group Details   05/14/20 1811 05/14/20 1812   05/15/20 1000  remdesivir 100 mg in sodium chloride 0.9 % 100 mL IVPB       "Followed by" Linked Group Details   05/14/20 1734 05/18/20 1137   05/14/20 1811  remdesivir 200 mg  in sodium chloride 0.9% 250 mL IVPB  Status:  Discontinued       "Followed by" Linked Group Details   05/14/20 1811 05/14/20 1812   05/14/20 1745  remdesivir 200 mg in sodium chloride 0.9% 250 mL IVPB       "Followed by" Linked Group Details   05/14/20 1734 05/14/20 2029       Discharge Exam: Vitals:   05/22/20 0443 05/22/20 0800 05/22/20 1047 05/22/20 1049  BP: 120/80  107/67 117/69  Pulse: 88  78 77  Resp: 20     Temp: 98.4 F (36.9 C)     TempSrc: Oral     SpO2: 92% 93%    Weight:      Height:        General: A/O x4, No acute respiratory distress, cachectic Eyes: negative scleral hemorrhage, negative anisocoria, negative icterus ENT: Negative Runny nose, negative gingival bleeding, Neck:  Negative scars, masses, torticollis, lymphadenopathy, JVD Lungs: Clear to auscultation bilaterally without wheezes or crackles Cardiovascular: Regular rate and rhythm without murmur gallop or rub normal S1 and S2   Discharge Instructions  Discharge Instructions    Change dressing (specify)   Complete by: As directed    Dressing change: per WOC instructions   Diet - low sodium heart healthy   Complete by: As directed    Discharge instructions   Complete by: As directed    ?   Person Under Monitoring Name: Brandy Gutierrez  Location: [address] [place] [address]   Infection Prevention Recommendations for Individuals Confirmed to have, or Being Evaluated for, 2019 Novel Coronavirus (COVID-19) Infection Who Receive Care at Home  Individuals who are confirmed to have, or are being evaluated for, COVID-19 should follow the prevention steps below until a healthcare provider or local or state health department says they can return to normal activities.  Stay home except to get medical care You should restrict activities outside your home, except for getting medical care. Do not go to work, school, or public areas, and do not use public transportation or taxis.  Call  ahead before visiting your doctor Before your medical appointment, call the healthcare provider and tell them that you have, or are being evaluated for, COVID-19 infection. This will help the healthcare provider's office take steps to keep other people from getting infected. Ask your healthcare provider to call the local or state health department.  Monitor your symptoms Seek prompt medical attention if your illness is worsening (e.g., difficulty breathing). Before going to your medical appointment, call the healthcare provider and tell them that you have, or are being evaluated for, COVID-19 infection. Ask your healthcare provider to call the local or state health department.  Wear a facemask You should wear a facemask that covers your nose and mouth when you are in the same room with other people and when you visit a healthcare provider. People who live with or visit you should also wear a facemask while they are in the same room with you.  Separate yourself from other people in your home As much as possible, you should stay in a different room from other people in your home. Also, you should  use a separate bathroom, if available.  Avoid sharing household items You should not share dishes, drinking glasses, cups, eating utensils, towels, bedding, or other items with other people in your home. After using these items, you should wash them thoroughly with soap and water.  Cover your coughs and sneezes Cover your mouth and nose with a tissue when you cough or sneeze, or you can cough or sneeze into your sleeve. Throw used tissues in a lined trash can, and immediately wash your hands with soap and water for at least 20 seconds or use an alcohol-based hand rub.  Wash your [facility] your hands often and thoroughly with soap and water for at least 20 seconds. You can use an alcohol-based hand sanitizer if soap and water are not available and if your hands are not visibly dirty. Avoid  touching your eyes, nose, and mouth with unwashed hands.   Prevention Steps for Caregivers and Household Members of Individuals Confirmed to have, or Being Evaluated for, COVID-19 Infection Being Cared for in the Home  If you live with, or provide care at home for, a person confirmed to have, or being evaluated for, COVID-19 infection please follow these guidelines to prevent infection:  Follow healthcare provider's instructions Make sure that you understand and can help the patient follow any healthcare provider instructions for all care.  Provide for the patient's basic needs You should help the patient with basic needs in the home and provide support for getting groceries, prescriptions, and other personal needs.  Monitor the patient's symptoms If they are getting sicker, call his or her medical provider and tell them that the patient has, or is being evaluated for, COVID-19 infection. This will help the healthcare provider's office take steps to keep other people from getting infected. Ask the healthcare provider to call the local or state health department.  Limit the number of people who have contact with the patient If possible, have only one caregiver for the patient. Other household members should stay in another home or place of residence. If this is not possible, they should stay in another room, or be separated from the patient as much as possible. Use a separate bathroom, if available. Restrict visitors who do not have an essential need to be in the home.  Keep older adults, very young children, and other sick people away from the patient Keep older adults, very young children, and those who have compromised immune systems or chronic health conditions away from the patient. This includes people with chronic heart, lung, or kidney conditions, diabetes, and cancer.  Ensure good ventilation Make sure that shared spaces in the home have good air flow, such as from an air  conditioner or an opened window, weather permitting.  Wash your hands often Wash your hands often and thoroughly with soap and water for at least 20 seconds. You can use an alcohol based hand sanitizer if soap and water are not available and if your hands are not visibly dirty. Avoid touching your eyes, nose, and mouth with unwashed hands. Use disposable paper towels to dry your hands. If not available, use dedicated cloth towels and replace them when they become wet.  Wear a facemask and gloves Wear a disposable facemask at all times in the room and gloves when you touch or have contact with the patient's blood, body fluids, and/or secretions or excretions, such as sweat, saliva, sputum, nasal mucus, vomit, urine, or feces.  Ensure the mask fits over your nose and mouth  tightly, and do not touch it during use. Throw out disposable facemasks and gloves after using them. Do not reuse. Wash your hands immediately after removing your facemask and gloves. If your personal clothing becomes contaminated, carefully remove clothing and launder. Wash your hands after handling contaminated clothing. Place all used disposable facemasks, gloves, and other waste in a lined container before disposing them with other household waste. Remove gloves and wash your hands immediately after handling these items.  Do not share dishes, glasses, or other household items with the patient Avoid sharing household items. You should not share dishes, drinking glasses, cups, eating utensils, towels, bedding, or other items with a patient who is confirmed to have, or being evaluated for, COVID-19 infection. After the person uses these items, you should wash them thoroughly with soap and water.  Wash laundry thoroughly Immediately remove and wash clothes or bedding that have blood, body fluids, and/or secretions or excretions, such as sweat, saliva, sputum, nasal mucus, vomit, urine, or feces, on them. Wear gloves when  handling laundry from the patient. Read and follow directions on labels of laundry or clothing items and detergent. In general, wash and dry with the warmest temperatures recommended on the label.  Clean all areas the individual has used often Clean all touchable surfaces, such as counters, tabletops, doorknobs, bathroom fixtures, toilets, phones, keyboards, tablets, and bedside tables, every day. Also, clean any surfaces that may have blood, body fluids, and/or secretions or excretions on them. Wear gloves when cleaning surfaces the patient has come in contact with. Use a diluted bleach solution (e.g., dilute bleach with 1 part bleach and 10 parts water) or a household disinfectant with a label that says EPA-registered for coronaviruses. To make a bleach solution at home, add 1 tablespoon of bleach to 1 quart (4 cups) of water. For a larger supply, add  cup of bleach to 1 gallon (16 cups) of water. Read labels of cleaning products and follow recommendations provided on product labels. Labels contain instructions for safe and effective use of the cleaning product including precautions you should take when applying the product, such as wearing gloves or eye protection and making sure you have good ventilation during use of the product. Remove gloves and wash hands immediately after cleaning.  Monitor yourself for signs and symptoms of illness Caregivers and household members are considered close contacts, should monitor their health, and will be asked to limit movement outside of the home to the extent possible. Follow the monitoring steps for close contacts listed on the symptom monitoring form.   ? If you have additional questions, contact your local health department or call the epidemiologist on call at [phone] (available 24/7). ? This guidance is subject to change. For the most up-to-date guidance from [facility], please refer to their  website: YouBlogs.pl   Increase activity slowly   Complete by: As directed      Allergies as of 05/22/2020      Reactions   Sulfa Antibiotics Hives, Shortness Of Breath   Metronidazole Other (See Comments)   Unknown reaction type   Carbamazepine Other (See Comments)   jittery   Cymbalta [duloxetine Hcl] Other (See Comments)   Confusion      Medication List    STOP taking these medications   amitriptyline 10 MG tablet Commonly known as: ELAVIL   gabapentin 600 MG tablet Commonly known as: NEURONTIN Replaced by: gabapentin 100 MG capsule   HYDROcodone-acetaminophen 5-325 MG tablet Commonly known as: NORCO/VICODIN   ipratropium-albuterol 0.5-2.5 (  3) MG/3ML Soln Commonly known as: DUONEB Replaced by: Ipratropium-Albuterol 20-100 MCG/ACT Aers respimat   midodrine 2.5 MG tablet Commonly known as: PROAMATINE     TAKE these medications   acetaminophen 325 MG tablet Commonly known as: TYLENOL Take 2 tablets (650 mg total) by mouth every 6 (six) hours as needed for mild pain or headache (fever >/= 101). What changed:   medication strength  how much to take  when to take this  reasons to take this   Allegra Allergy 180 MG tablet Generic drug: fexofenadine Take 180 mg by mouth daily.   ascorbic acid 500 MG tablet Commonly known as: VITAMIN C Take 1 tablet (500 mg total) by mouth daily. Start taking on: May 23, 2020   aspirin EC 81 MG tablet Take 81 mg by mouth at bedtime.   Biotin 10000 MCG Tabs Take 10,000 mcg by mouth 2 (two) times daily.   CALCIUM 500 PO Take 500 mg by mouth at bedtime.   clopidogrel 75 MG tablet Commonly known as: PLAVIX Take 75 mg by mouth every other day.   cyanocobalamin 1000 MCG/ML injection Commonly known as: (VITAMIN B-12) Inject 1,000 mcg into the muscle every 30 (thirty) days.   folic acid 1 MG tablet Commonly known as: FOLVITE Take 1 mg by mouth daily.    gabapentin 100 MG capsule Commonly known as: NEURONTIN Take 1 capsule (100 mg total) by mouth 3 (three) times daily. Replaces: gabapentin 600 MG tablet   Ipratropium-Albuterol 20-100 MCG/ACT Aers respimat Commonly known as: COMBIVENT Inhale 1 puff into the lungs every 6 (six) hours as needed for wheezing. Replaces: ipratropium-albuterol 0.5-2.5 (3) MG/3ML Soln   lamoTRIgine 25 MG tablet Commonly known as: LAMICTAL Take 50 mg by mouth 2 (two) times daily.   levETIRAcetam 500 MG tablet Commonly known as: KEPPRA TAKE 1 TABLET BY MOUTH TWICE DAILY   loperamide 2 MG capsule Commonly known as: IMODIUM Take 2 capsules (4 mg total) by mouth as needed for diarrhea or loose stools.   mirtazapine 15 MG tablet Commonly known as: REMERON Take 15 mg by mouth at bedtime.   multivitamin with minerals Tabs tablet Take 1 tablet by mouth at bedtime.   ondansetron 4 MG tablet Commonly known as: ZOFRAN Take 1 tablet (4 mg total) by mouth every 6 (six) hours as needed for nausea.   pantoprazole 40 MG tablet Commonly known as: PROTONIX Take 40 mg by mouth 2 (two) times daily.   predniSONE 50 MG tablet Commonly known as: DELTASONE Take 1 tablet (50 mg total) by mouth daily with breakfast. Start taking on: May 23, 2020   ProAir HFA 108 (90 Base) MCG/ACT inhaler Generic drug: albuterol Inhale 2 puffs into the lungs every 6 (six) hours as needed for wheezing or shortness of breath.   propranolol 20 MG tablet Commonly known as: INDERAL Take 20 mg by mouth 2 (two) times daily.   psyllium 95 % Pack Commonly known as: HYDROCIL/METAMUCIL Take 1 packet by mouth daily. Start taking on: May 23, 2020   rosuvastatin 20 MG tablet Commonly known as: CRESTOR Take 20 mg by mouth at bedtime.   thiamine 100 MG tablet Commonly known as: Vitamin B-1 Take 100 mg by mouth daily.   traMADol 50 MG tablet Commonly known as: ULTRAM TAKE 1 TABLET BY MOUTH EVERY 6 HOURS AS NEEDED   Vitamin D-3  25 MCG (1000 UT) Caps Take 1,000 Units by mouth daily.   zinc sulfate 220 (50 Zn) MG capsule Take 1 capsule (  220 mg total) by mouth daily. Start taking on: May 23, 2020            Discharge Care Instructions  (From admission, onward)         Start     Ordered   05/22/20 0000  Change dressing (specify)       Comments: Dressing change: per WOC instructions   05/22/20 1134         Allergies  Allergen Reactions  . Sulfa Antibiotics Hives and Shortness Of Breath  . Metronidazole Other (See Comments)    Unknown reaction type  . Carbamazepine Other (See Comments)    jittery  . Cymbalta [Duloxetine Hcl] Other (See Comments)    Confusion    Contact information for after-discharge care    Destination    [facility] .   Service: Skilled Nursing Contact information: [place] [phone]                   The results of significant diagnostics from this hospitalization (including imaging, microbiology, ancillary and laboratory) are listed below for reference.    Significant Diagnostic Studies: CT HEAD WO CONTRAST  Result Date: 05/17/2020 CLINICAL DATA:  Covid + Delirium. Unable to get hx from pt. EXAM: CT HEAD WITHOUT CONTRAST TECHNIQUE: Contiguous axial images were obtained from the base of the skull through the vertex without intravenous contrast. COMPARISON:  MR head 12/20/2019, CT angio head 07/10/18 report without images, CT head 06/26/2019 FINDINGS: Brain: Cerebral ventricle sizes are concordant with the degree of cerebral volume loss. No evidence of large-territorial acute infarction. No parenchymal hemorrhage. No mass lesion. No extra-axial collection. No mass effect or midline shift. No hydrocephalus. Basilar cisterns are patent. Vascular: Anterior communicating artery aneurysmal clip noted. Skull: Negative for fracture or focal lesion. Sinuses/Orbits: Paranasal sinuses and mastoid air cells are  clear. The orbits are unremarkable. Other: None. IMPRESSION: No acute intracranial abnormality. Electronically Signed   By: Iven Finn M.D.   On: 05/17/2020 17:26   DG CHEST PORT 1 VIEW  Result Date: 05/19/2020 CLINICAL DATA:  Pneumonia EXAM: PORTABLE CHEST 1 VIEW COMPARISON:  05/16/2020 FINDINGS: Normal heart size and mediastinal contours. There is prominence of the left hilum attributed to rotation given better aligned radiograph 05/14/2020. There is no edema, consolidation, effusion, or pneumothorax. IMPRESSION: No evidence of active disease. Electronically Signed   By: Monte Fantasia M.D.   On: 05/19/2020 05:50   DG CHEST PORT 1 VIEW  Result Date: 05/16/2020 CLINICAL DATA:  Confusion.  COVID positive. EXAM: PORTABLE CHEST 1 VIEW COMPARISON:  05/14/2020. FINDINGS: Mediastinum and hilar structures normal. Heart size normal. No focal infiltrate. No pleural effusion or pneumothorax. IMPRESSION: No acute cardiopulmonary disease. Electronically Signed   By: Marcello Moores  Register   On: 05/16/2020 11:52   DG Chest Port 1 View  Result Date: 05/14/2020 CLINICAL DATA:  Cough EXAM: PORTABLE CHEST 1 VIEW COMPARISON:  11/09/2019 FINDINGS: Artifact from EKG leads. Normal heart size and mediastinal contours. No acute infiltrate or edema. No effusion or pneumothorax. No acute osseous findings. IMPRESSION: No active disease. Electronically Signed   By: Monte Fantasia M.D.   On: 05/14/2020 13:47   EEG adult  Result Date: 05/17/2020 Lora Havens, MD     05/17/2020  9:32 AM Patient Name: KINLIE JANICE MRN: [id number] Epilepsy Attending: Lora Havens Referring Physician/Provider: Dr. Berle Mull Date: 05/18/2019 Duration: 22.26 minutes Patient history: 59 year old female with history of epilepsy was admitted  for COVID-19 pneumonia.  EEG to evaluate for seizures. Level of alertness: Awake AEDs during EEG study: Gabapentin, lamotrigine, Keppra Technical aspects: This EEG study was done with scalp electrodes  positioned according to the 10-20 International system of electrode placement. Electrical activity was acquired at a sampling rate of _0  and reviewed with a high frequency filter of _1  and a low frequency filter of _2 . EEG data were recorded continuously and digitally stored. Description:   No posterior dominant rhythm was seen.  EEG showed continuous generalized 3 to 6 Hz theta-delta slowing.  Hyperventilation and photic stimulation were not performed.   Off note, study was technically difficult due to significant electrode and myogenic artifact. ABNORMALITY -Continuous slow, generalized IMPRESSION: This technically difficult study is suggestive of moderate diffuse encephalopathy, nonspecific to etiology. No seizures or epileptiform discharges were seen throughout the recording. Lora Havens    Microbiology: Recent Results (from the past 240 hour(s))  Blood Culture (routine x 2)     Status: None   Collection Time: 05/14/20  1:29 PM   Specimen: Left Antecubital; Blood  Result Value Ref Range Status   Specimen Description   Final    LEFT ANTECUBITAL Performed at [facility] [address] [place], [place] [address]    Special Requests   Final    BOTTLES DRAWN AEROBIC AND ANAEROBIC Blood Culture adequate volume Performed at [facility] [address], [place], [place] [address]    Culture   Final    NO GROWTH 5 DAYS Performed at [facility], [place] [address], [place], [place] [address]    Report Status 05/19/2020 FINAL  Final  Blood Culture (routine x 2)     Status: None   Collection Time: 05/14/20  1:31 PM   Specimen: Right Antecubital; Blood  Result Value Ref Range Status   Specimen Description   Final    RIGHT ANTECUBITAL Performed at [facility] [address] [place], [place] [address]    Special Requests   Final    BOTTLES DRAWN AEROBIC AND ANAEROBIC Blood Culture adequate volume Performed at [facility] [address] [place], [place] [address]    Culture   Final    NO GROWTH 5 DAYS Performed at [facility], [place] [address] [place], [place] [address]    Report Status 05/19/2020 FINAL  Final  Resp Panel by RT PCR (RSV, Flu A&B, Covid) - Nasopharyngeal Swab     Status: Abnormal   Collection Time: 05/14/20  1:31 PM   Specimen: Nasopharyngeal Swab  Result Value Ref Range Status   SARS Coronavirus 2 by RT PCR POSITIVE (A) NEGATIVE Final    Comment: RESULT CALLED TO, READ BACK BY AND VERIFIED WITH: WILLIAM,F RN _3  ON 05/14/20 JACKSON,K (NOTE) SARS-CoV-2 target nucleic acids are DETECTED.  SARS-CoV-2 RNA is generally detectable in upper respiratory specimens  during the acute phase of infection. Positive results are indicative of the presence of the identified virus, but do not rule out bacterial infection or co-infection with other pathogens not detected by the test. Clinical correlation with patient history and other diagnostic information is necessary to determine patient infection status. The expected result is Negative.  Fact Sheet for Patients:  PinkCheek.be  Fact Sheet for Healthcare Providers: GravelBags.it  This test is not yet approved or cleared by the [place] FDA and  has been authorized for detection and/or diagnosis of SARS-CoV-2 by FDA under an Emergency Use  Authorization (EUA).  This EUA will remain in effect (meaning this test ca n be used) for the duration of  the COVID-19 declaration under Section 564(b)(1) of the Act, 21 U.S.C. section 360bbb-3(b)(1), unless the authorization is terminated or revoked sooner.      Influenza A by PCR NEGATIVE NEGATIVE Final   Influenza B by PCR NEGATIVE NEGATIVE Final    Comment: (NOTE) The Xpert Xpress SARS-CoV-2/FLU/RSV assay is intended as an aid in  the diagnosis of influenza from Nasopharyngeal swab specimens and  should  not be used as a sole basis for treatment. Nasal washings and  aspirates are unacceptable for Xpert Xpress SARS-CoV-2/FLU/RSV  testing.  Fact Sheet for Patients: PinkCheek.be  Fact Sheet for Healthcare Providers: GravelBags.it  This test is not yet approved or cleared by the [place] FDA and  has been authorized for detection and/or diagnosis of SARS-CoV-2 by  FDA under an Emergency Use Authorization (EUA). This EUA will remain  in effect (meaning this test can be used) for the duration of the  Covid-19 declaration under Section 564(b)(1) of the Act, 21  U.S.C. section 360bbb-3(b)(1), unless the authorization is  terminated or revoked.    Respiratory Syncytial Virus by PCR NEGATIVE NEGATIVE Final    Comment: (NOTE) Fact Sheet for Patients: PinkCheek.be  Fact Sheet for Healthcare Providers: GravelBags.it  This test is not yet approved or cleared by the [place] FDA and  has been authorized for detection and/or diagnosis of SARS-CoV-2 by  FDA under an Emergency Use Authorization (EUA). This EUA will remain  in effect (meaning this test can be used) for the duration of the  COVID-19 declaration under Section 564(b)(1) of the Act, 21 U.S.C.  section 360bbb-3(b)(1), unless the authorization is terminated or  revoked. Performed at [facility], [place] [address], [place], [place] [address]   Gastrointestinal Panel by PCR , Stool     Status: None   Collection Time: 05/19/20  3:06 PM   Specimen: Stool  Result Value Ref Range Status   Campylobacter species NOT DETECTED NOT DETECTED Final   Plesimonas shigelloides NOT DETECTED NOT DETECTED Final   Salmonella species NOT DETECTED NOT DETECTED Final   Yersinia enterocolitica NOT DETECTED NOT DETECTED Final   Vibrio species NOT DETECTED NOT DETECTED Final   Vibrio cholerae NOT DETECTED NOT  DETECTED Final   Enteroaggregative E coli (EAEC) NOT DETECTED NOT DETECTED Final   Enteropathogenic E coli (EPEC) NOT DETECTED NOT DETECTED Final   Enterotoxigenic E coli (ETEC) NOT DETECTED NOT DETECTED Final   Shiga like toxin producing E coli (STEC) NOT DETECTED NOT DETECTED Final   Shigella/Enteroinvasive E coli (EIEC) NOT DETECTED NOT DETECTED Final   Cryptosporidium NOT DETECTED NOT DETECTED Final   Cyclospora cayetanensis NOT DETECTED NOT DETECTED Final   Entamoeba histolytica NOT DETECTED NOT DETECTED Final   Giardia lamblia NOT DETECTED NOT DETECTED Final   Adenovirus F40/41 NOT DETECTED NOT DETECTED Final   Astrovirus NOT DETECTED NOT DETECTED Final   Norovirus GI/GII NOT DETECTED NOT DETECTED Final   Rotavirus A NOT DETECTED NOT DETECTED Final   Sapovirus (I, II, IV, and V) NOT DETECTED NOT DETECTED Final    Comment: Performed at [facility], [address] [place], [place] [address]     Labs: Basic Metabolic Panel: Recent Labs  Lab 05/18/20 0425 05/19/20 0325 05/20/20 0525 05/21/20 0524 05/22/20 0446  NA 136 139 140 137 135  K 3.1* 4.1 3.0* 4.7 3.8  CL 91* 100  101 103 98  CO2 _0 GLUCOSE 136* 131* 150* 133* 85  BUN _1 CREATININE 0.58 0.58 0.57 0.62 0.58  CALCIUM 8.5* 8.2* 8.5* 9.1 9.5  MG 1.9 2.0 2.0 2.3 2.0  PHOS 4.7* 2.2* 3.5 3.3 3.2   Liver Function Tests: Recent Labs  Lab 05/18/20 0425 05/19/20 0325 05/20/20 0525 05/21/20 0524 05/22/20 0446  AST 32 _2 ALT _3 ALKPHOS 82 68 70 76 71  BILITOT 0.8 0.9 0.7 0.5 0.6  PROT 6.7 5.9* 6.0* 6.4* 6.3*  ALBUMIN 2.9* 2.5* 2.6* 2.8* 2.9*   No results for input(s): LIPASE, AMYLASE in the last 168 hours. Recent Labs  Lab 05/16/20 1236  AMMONIA 19   CBC: Recent Labs  Lab 05/18/20 0425 05/19/20 0325 05/20/20 0525 05/21/20 0524 05/22/20 0446  WBC 3.8* 3.2* 3.8* 5.8 8.5  NEUTROABS 3.2 2.5 3.0 4.6 6.1  HGB 10.0* 9.3* 9.2* 10.2* 11.0*  HCT 32.3*  29.7* 29.1* 34.4* 35.8*  MCV 111.0* 109.2* 108.2* 112.1* 108.2*  PLT 233 246 301 418* 382   Cardiac Enzymes: No results for input(s): CKTOTAL, CKMB, CKMBINDEX, TROPONINI in the last 168 hours. BNP: BNP (last 3 results) No results for input(s): BNP in the last 8760 hours.  ProBNP (last 3 results) No results for input(s): PROBNP in the last 8760 hours.  CBG: No results for input(s): GLUCAP in the last 168 hours.     Signed:  Dia Crawford, MD [facility] [phone] pager

## 2020-05-22 NOTE — Progress Notes (Signed)
Attempted to call report on patient multiple times to [facility], phone rang for over 10 minutes.  Report given to PTAR.  Virginia Rochester, RN

## 2020-05-22 NOTE — Consult Note (Signed)
I have placed a request via Secure Chat to Dr. Woods requesting photos of the wound areas of concern to be placed in the EMR.    Hokulani Rogel MSN,RN,CWOCN, CNS, CWON-AP [phone]  

## 2020-05-22 NOTE — Consult Note (Signed)
[facility] Nurse wound consult note Consultation was completed by review of records, images and assistance from the bedside nurse/clinical staff.   Reason for Consult: great toe wound Per nursing notes, traumatic wound that patient sustained over a year ago.  Wound type: full thickness trauma wound  Pressure Injury POA: NA Measurement: see nursing flow sheet  Wound bed:  Pink/yellow per nursing flow sheet  Drainage (amount, consistency, odor)  Periwound: intact  Dressing procedure/placement/frequency: Skin care order set implemented appropriately. Will discuss with WTA (wound treatment associate) on the floor that is caring for this patient today. Orders updated prior to SNF DC   Re consult if needed, will not follow at this time. Thanks  Layci Stenglein [employer], RN,CWOCN, CNS, [place] [phone])

## 2020-05-22 NOTE — Care Management Important Message (Signed)
Important Message  Patient Details IM Letter given to the Patient Name: Brandy Gutierrez MRN: [id number] Date of Birth: Jul 13, 1961   Medicare Important Message Given:  Yes     Kerin Salen 05/22/2020, 12:42 PM

## 2020-05-23 DIAGNOSIS — J9601 Acute respiratory failure with hypoxia: Secondary | ICD-10-CM | POA: Diagnosis not present

## 2020-05-23 DIAGNOSIS — M797 Fibromyalgia: Secondary | ICD-10-CM | POA: Diagnosis not present

## 2020-05-23 DIAGNOSIS — J1282 Pneumonia due to coronavirus disease 2019: Secondary | ICD-10-CM | POA: Diagnosis not present

## 2020-05-23 DIAGNOSIS — U071 COVID-19: Secondary | ICD-10-CM | POA: Diagnosis not present

## 2020-05-24 DIAGNOSIS — R2681 Unsteadiness on feet: Secondary | ICD-10-CM | POA: Diagnosis not present

## 2020-05-24 DIAGNOSIS — B9732 Oncovirus as the cause of diseases classified elsewhere: Secondary | ICD-10-CM | POA: Diagnosis not present

## 2020-05-24 DIAGNOSIS — J9601 Acute respiratory failure with hypoxia: Secondary | ICD-10-CM | POA: Diagnosis not present

## 2020-05-24 DIAGNOSIS — G8929 Other chronic pain: Secondary | ICD-10-CM | POA: Diagnosis not present

## 2020-05-24 DIAGNOSIS — M6281 Muscle weakness (generalized): Secondary | ICD-10-CM | POA: Diagnosis not present

## 2020-05-24 DIAGNOSIS — E782 Mixed hyperlipidemia: Secondary | ICD-10-CM | POA: Diagnosis not present

## 2020-05-24 DIAGNOSIS — R059 Cough, unspecified: Secondary | ICD-10-CM | POA: Diagnosis not present

## 2020-05-25 DIAGNOSIS — L97529 Non-pressure chronic ulcer of other part of left foot with unspecified severity: Secondary | ICD-10-CM | POA: Diagnosis not present

## 2020-05-25 DIAGNOSIS — J9601 Acute respiratory failure with hypoxia: Secondary | ICD-10-CM | POA: Diagnosis not present

## 2020-05-26 DIAGNOSIS — M797 Fibromyalgia: Secondary | ICD-10-CM | POA: Diagnosis not present

## 2020-05-26 DIAGNOSIS — R569 Unspecified convulsions: Secondary | ICD-10-CM | POA: Diagnosis not present

## 2020-05-26 DIAGNOSIS — G9341 Metabolic encephalopathy: Secondary | ICD-10-CM | POA: Diagnosis not present

## 2020-05-26 DIAGNOSIS — E785 Hyperlipidemia, unspecified: Secondary | ICD-10-CM | POA: Diagnosis not present

## 2020-05-29 DIAGNOSIS — B9732 Oncovirus as the cause of diseases classified elsewhere: Secondary | ICD-10-CM | POA: Diagnosis not present

## 2020-05-29 DIAGNOSIS — R2681 Unsteadiness on feet: Secondary | ICD-10-CM | POA: Diagnosis not present

## 2020-05-29 DIAGNOSIS — G8929 Other chronic pain: Secondary | ICD-10-CM | POA: Diagnosis not present

## 2020-05-29 DIAGNOSIS — M6281 Muscle weakness (generalized): Secondary | ICD-10-CM | POA: Diagnosis not present

## 2020-05-29 DIAGNOSIS — E782 Mixed hyperlipidemia: Secondary | ICD-10-CM | POA: Diagnosis not present

## 2020-05-30 DIAGNOSIS — G9341 Metabolic encephalopathy: Secondary | ICD-10-CM | POA: Diagnosis not present

## 2020-05-30 DIAGNOSIS — J9601 Acute respiratory failure with hypoxia: Secondary | ICD-10-CM | POA: Diagnosis not present

## 2020-05-31 DIAGNOSIS — M6281 Muscle weakness (generalized): Secondary | ICD-10-CM | POA: Diagnosis not present

## 2020-05-31 DIAGNOSIS — G8929 Other chronic pain: Secondary | ICD-10-CM | POA: Diagnosis not present

## 2020-05-31 DIAGNOSIS — B9732 Oncovirus as the cause of diseases classified elsewhere: Secondary | ICD-10-CM | POA: Diagnosis not present

## 2020-05-31 DIAGNOSIS — E782 Mixed hyperlipidemia: Secondary | ICD-10-CM | POA: Diagnosis not present

## 2020-05-31 DIAGNOSIS — R2681 Unsteadiness on feet: Secondary | ICD-10-CM | POA: Diagnosis not present

## 2020-06-01 ENCOUNTER — Ambulatory Visit: Payer: Medicare Other | Admitting: Neurology

## 2020-06-01 DIAGNOSIS — L97529 Non-pressure chronic ulcer of other part of left foot with unspecified severity: Secondary | ICD-10-CM | POA: Diagnosis not present

## 2020-06-02 DIAGNOSIS — B9732 Oncovirus as the cause of diseases classified elsewhere: Secondary | ICD-10-CM | POA: Diagnosis not present

## 2020-06-02 DIAGNOSIS — J9601 Acute respiratory failure with hypoxia: Secondary | ICD-10-CM | POA: Diagnosis not present

## 2020-06-02 DIAGNOSIS — R2681 Unsteadiness on feet: Secondary | ICD-10-CM | POA: Diagnosis not present

## 2020-06-02 DIAGNOSIS — J1282 Pneumonia due to coronavirus disease 2019: Secondary | ICD-10-CM | POA: Diagnosis not present

## 2020-06-02 DIAGNOSIS — U071 COVID-19: Secondary | ICD-10-CM | POA: Diagnosis not present

## 2020-06-02 DIAGNOSIS — M6281 Muscle weakness (generalized): Secondary | ICD-10-CM | POA: Diagnosis not present

## 2020-06-02 DIAGNOSIS — G8929 Other chronic pain: Secondary | ICD-10-CM | POA: Diagnosis not present

## 2020-06-02 DIAGNOSIS — E782 Mixed hyperlipidemia: Secondary | ICD-10-CM | POA: Diagnosis not present

## 2020-06-07 DIAGNOSIS — M6281 Muscle weakness (generalized): Secondary | ICD-10-CM | POA: Diagnosis not present

## 2020-06-07 DIAGNOSIS — M546 Pain in thoracic spine: Secondary | ICD-10-CM | POA: Diagnosis not present

## 2020-06-07 DIAGNOSIS — R269 Unspecified abnormalities of gait and mobility: Secondary | ICD-10-CM | POA: Diagnosis not present

## 2020-06-13 DIAGNOSIS — M6281 Muscle weakness (generalized): Secondary | ICD-10-CM | POA: Diagnosis not present

## 2020-06-13 DIAGNOSIS — R269 Unspecified abnormalities of gait and mobility: Secondary | ICD-10-CM | POA: Diagnosis not present

## 2020-06-13 DIAGNOSIS — M546 Pain in thoracic spine: Secondary | ICD-10-CM | POA: Diagnosis not present

## 2020-06-14 DIAGNOSIS — Z681 Body mass index (BMI) 19 or less, adult: Secondary | ICD-10-CM | POA: Diagnosis not present

## 2020-06-14 DIAGNOSIS — M5459 Other low back pain: Secondary | ICD-10-CM | POA: Diagnosis not present

## 2020-06-14 DIAGNOSIS — Z20822 Contact with and (suspected) exposure to covid-19: Secondary | ICD-10-CM | POA: Diagnosis not present

## 2020-06-15 DIAGNOSIS — M6281 Muscle weakness (generalized): Secondary | ICD-10-CM | POA: Diagnosis not present

## 2020-06-15 DIAGNOSIS — R269 Unspecified abnormalities of gait and mobility: Secondary | ICD-10-CM | POA: Diagnosis not present

## 2020-06-15 DIAGNOSIS — M546 Pain in thoracic spine: Secondary | ICD-10-CM | POA: Diagnosis not present

## 2020-06-19 DIAGNOSIS — R269 Unspecified abnormalities of gait and mobility: Secondary | ICD-10-CM | POA: Diagnosis not present

## 2020-06-19 DIAGNOSIS — M6281 Muscle weakness (generalized): Secondary | ICD-10-CM | POA: Diagnosis not present

## 2020-06-19 DIAGNOSIS — M546 Pain in thoracic spine: Secondary | ICD-10-CM | POA: Diagnosis not present

## 2020-06-21 DIAGNOSIS — M546 Pain in thoracic spine: Secondary | ICD-10-CM | POA: Diagnosis not present

## 2020-06-21 DIAGNOSIS — M6281 Muscle weakness (generalized): Secondary | ICD-10-CM | POA: Diagnosis not present

## 2020-06-21 DIAGNOSIS — R269 Unspecified abnormalities of gait and mobility: Secondary | ICD-10-CM | POA: Diagnosis not present

## 2020-06-28 DIAGNOSIS — M6281 Muscle weakness (generalized): Secondary | ICD-10-CM | POA: Diagnosis not present

## 2020-06-28 DIAGNOSIS — R269 Unspecified abnormalities of gait and mobility: Secondary | ICD-10-CM | POA: Diagnosis not present

## 2020-06-28 DIAGNOSIS — M546 Pain in thoracic spine: Secondary | ICD-10-CM | POA: Diagnosis not present

## 2020-07-03 DIAGNOSIS — M546 Pain in thoracic spine: Secondary | ICD-10-CM | POA: Diagnosis not present

## 2020-07-03 DIAGNOSIS — M6281 Muscle weakness (generalized): Secondary | ICD-10-CM | POA: Diagnosis not present

## 2020-07-03 DIAGNOSIS — R269 Unspecified abnormalities of gait and mobility: Secondary | ICD-10-CM | POA: Diagnosis not present

## 2020-07-05 DIAGNOSIS — M546 Pain in thoracic spine: Secondary | ICD-10-CM | POA: Diagnosis not present

## 2020-07-05 DIAGNOSIS — R269 Unspecified abnormalities of gait and mobility: Secondary | ICD-10-CM | POA: Diagnosis not present

## 2020-07-05 DIAGNOSIS — M6281 Muscle weakness (generalized): Secondary | ICD-10-CM | POA: Diagnosis not present

## 2020-07-05 DIAGNOSIS — M2681 Anterior soft tissue impingement: Secondary | ICD-10-CM | POA: Diagnosis not present

## 2020-07-10 DIAGNOSIS — R269 Unspecified abnormalities of gait and mobility: Secondary | ICD-10-CM | POA: Diagnosis not present

## 2020-07-10 DIAGNOSIS — M6281 Muscle weakness (generalized): Secondary | ICD-10-CM | POA: Diagnosis not present

## 2020-07-10 DIAGNOSIS — M546 Pain in thoracic spine: Secondary | ICD-10-CM | POA: Diagnosis not present

## 2020-07-12 DIAGNOSIS — Z681 Body mass index (BMI) 19 or less, adult: Secondary | ICD-10-CM | POA: Diagnosis not present

## 2020-07-12 DIAGNOSIS — M5459 Other low back pain: Secondary | ICD-10-CM | POA: Diagnosis not present

## 2020-07-12 DIAGNOSIS — R269 Unspecified abnormalities of gait and mobility: Secondary | ICD-10-CM | POA: Diagnosis not present

## 2020-07-12 DIAGNOSIS — J449 Chronic obstructive pulmonary disease, unspecified: Secondary | ICD-10-CM | POA: Diagnosis not present

## 2020-07-12 DIAGNOSIS — M546 Pain in thoracic spine: Secondary | ICD-10-CM | POA: Diagnosis not present

## 2020-07-12 DIAGNOSIS — M6281 Muscle weakness (generalized): Secondary | ICD-10-CM | POA: Diagnosis not present

## 2020-08-16 DIAGNOSIS — I5023 Acute on chronic systolic (congestive) heart failure: Secondary | ICD-10-CM | POA: Diagnosis not present

## 2020-08-16 DIAGNOSIS — Z681 Body mass index (BMI) 19 or less, adult: Secondary | ICD-10-CM | POA: Diagnosis not present

## 2020-08-25 ENCOUNTER — Telehealth: Payer: Self-pay | Admitting: Neurology

## 2020-08-25 NOTE — Telephone Encounter (Signed)
I called the patient.  The patient had an event last night where she was walking out of the bathroom and then started getting woozy and started becoming tremulous and shaking on both sides.  She held onto a dresser and did not blackout did not fall.  She had a similar but more significant event around the time of her Covid infection in October, again she had shaking on both sides of the body and did not blackout.  I indicated that I do not think that these events represent seizures, it is possible she had an episode of low blood pressure coming out of the bathroom.  We will not alter the seizure medications currently.  The patient will contact me if she continues to have these events.

## 2020-08-25 NOTE — Telephone Encounter (Signed)
Pt called had a little seizure last night, lasted 3 to 5 minutes. Would like a call from the nurse.

## 2020-08-30 DIAGNOSIS — D649 Anemia, unspecified: Secondary | ICD-10-CM | POA: Diagnosis not present

## 2020-09-13 ENCOUNTER — Encounter: Payer: Self-pay | Admitting: Neurology

## 2020-09-13 ENCOUNTER — Ambulatory Visit: Payer: Medicare Other | Admitting: Neurology

## 2020-09-13 VITALS — BP 128/72 | HR 76 | Ht 66.0 in | Wt 103.0 lb

## 2020-09-13 DIAGNOSIS — M797 Fibromyalgia: Secondary | ICD-10-CM

## 2020-09-13 DIAGNOSIS — R55 Syncope and collapse: Secondary | ICD-10-CM | POA: Insufficient documentation

## 2020-09-13 DIAGNOSIS — G40909 Epilepsy, unspecified, not intractable, without status epilepticus: Secondary | ICD-10-CM | POA: Diagnosis not present

## 2020-09-13 DIAGNOSIS — R269 Unspecified abnormalities of gait and mobility: Secondary | ICD-10-CM

## 2020-09-13 NOTE — Progress Notes (Signed)
I have read the note, and I agree with the clinical assessment and plan.  Ladarious Kresse K Mate Alegria   

## 2020-09-13 NOTE — Progress Notes (Signed)
PATIENT: Brandy Gutierrez DOB: 1961-08-01  REASON FOR VISIT: follow up HISTORY FROM: patient  HISTORY OF PRESENT ILLNESS: Today 09/13/20 [name] is a 60 year old female with multiple chronic issues:  History of cerebral aneurysm, history of seizures, no recurrent seizure, on Lamictal 50 mg twice a day.  On Keppra 500 mg twice a day, started more for chronic pain control.  History of fibromyalgia.  Spells of syncope, related to low blood pressure drops, PCP has her on midodrine (she says last 3 months), spell on Monday, midodrine was increased 2.5 mg now 3 times daily.  Has been having spells for several months, she thought seizures.  Blood pressure at baseline in 90's at home. Called Dr. Jannifer Franklin 08/25/20, with low blood pressure spell after leaving the bathroom, no mention of midodrine. With spells, BP in 70's. Happens about once a week.  In July, reported difficulty walking, frequent falls, MRI of the cervical showed some arthritis, degenerative disc changes, but no compression of the spinal cord.  Walking has improved, no longer has frequent falls.  Uses quad cane.  Hospitalized with Covid infection in September 2021.  During that time, was hallucinating, amitriptyline, gabapentin was stopped.  Gabapentin has since been restarted at low-dose.  Still on Remeron.  Reported weight down 5 pounds, lost weight after Covid infection.  On low-dose propanolol, for reported tremor in the hands, she feels needed, couldn't come off.  Also on Lasix.  Seeing pain management on Monday, chronic left mid thoracic pain, at scapula. Controls her life, consumes her. Doesn't sleep well due to pain.  Chronic pain to left side of head for years.  CT head September 2021 showed no acute abnormalities.  Her husband is with her today, she isn't left alone much. They hire a sitter if he has to go out.   HISTORY 12/01/2019 Dr. Jannifer Franklin: [name] is a 61 year old right-handed white female with a  history of a cerebral aneurysm in the past, she has had some long lasting memory issues since that time.  The patient has developed onset of pain just below the scapula on the left with sharp shooting pains that may occur.  The patient is had a least 2 MRI evaluations of the thoracic spine that do not show an etiology of her pain.  The patient will be seeing Dr. Francesco Runner for injections of the back in the near future.  Currently, she is on Lamictal taking 50 mg twice daily, Keppra 250 mg twice daily, and gabapentin 600 mg 4 times daily.  She takes mirtazapine 15 mg at night, she has not been eating well and is losing weight.  The patient also takes amitriptyline 10 mg at night.  The patient is still healing from a fracture of the left foot, she is bearing weight but is walking with a cane.  She has a chronic gait disorder as well.  She did have a recent fall 3 weeks ago when she tripped over a dog toy.  She did not sustain significant injury.   REVIEW OF SYSTEMS: Out of a complete 14 system review of symptoms, the patient complains only of the following symptoms, and all other reviewed systems are negative.  See HPI  ALLERGIES: Allergies  Allergen Reactions  . Sulfa Antibiotics Hives and Shortness Of Breath  . Metronidazole Other (See Comments)    Unknown reaction type  . Carbamazepine Other (See Comments)    jittery  . Cymbalta [Duloxetine Hcl] Other (See Comments)    Confusion  HOME MEDICATIONS: Outpatient Medications Prior to Visit  Medication Sig Dispense Refill  . acetaminophen (TYLENOL) 325 MG tablet Take 2 tablets (650 mg total) by mouth every 6 (six) hours as needed for mild pain or headache (fever >/= 101). 10 tablet 0  . albuterol (VENTOLIN HFA) 108 (90 Base) MCG/ACT inhaler Inhale 2 puffs into the lungs every 6 (six) hours as needed for wheezing or shortness of breath.    Marland Kitchen ascorbic acid (VITAMIN C) 500 MG tablet Take 1 tablet (500 mg total) by mouth daily. 30 tablet 0  . aspirin  EC 81 MG tablet Take 81 mg by mouth at bedtime.     . Biotin 10000 MCG TABS Take 10,000 mcg by mouth 2 (two) times daily.    . Calcium Carbonate (CALCIUM 500 PO) Take 500 mg by mouth at bedtime.    . Cholecalciferol (VITAMIN D-3) 1000 units CAPS Take 1,000 Units by mouth daily.     . clopidogrel (PLAVIX) 75 MG tablet Take 75 mg by mouth every other day.   3  . cyanocobalamin (,VITAMIN B-12,) 1000 MCG/ML injection Inject 1,000 mcg into the muscle every 30 (thirty) days.    . fexofenadine (ALLEGRA) 180 MG tablet Take 180 mg by mouth daily.    . folic acid (FOLVITE) 1 MG tablet Take 1 mg by mouth daily.    . furosemide (LASIX) 20 MG tablet Take 20 mg by mouth daily.    Marland Kitchen gabapentin (NEURONTIN) 100 MG capsule Take 1 capsule (100 mg total) by mouth 3 (three) times daily. 15 capsule 0  . Ipratropium-Albuterol (COMBIVENT) 20-100 MCG/ACT AERS respimat Inhale 1 puff into the lungs every 6 (six) hours as needed for wheezing. 4 g 0  . lamoTRIgine (LAMICTAL) 25 MG tablet Take 50 mg by mouth 2 (two) times daily.     Marland Kitchen levETIRAcetam (KEPPRA) 500 MG tablet TAKE 1 TABLET BY MOUTH TWICE DAILY (Patient taking differently: Take 500 mg by mouth 2 (two) times daily.) 60 tablet 3  . mirtazapine (REMERON) 15 MG tablet Take 15 mg by mouth at bedtime.    . Multiple Vitamin (MULTIVITAMIN WITH MINERALS) TABS tablet Take 1 tablet by mouth at bedtime.     . ondansetron (ZOFRAN) 4 MG tablet Take 1 tablet (4 mg total) by mouth every 6 (six) hours as needed for nausea. 20 tablet 0  . pantoprazole (PROTONIX) 40 MG tablet Take 40 mg by mouth 2 (two) times daily.    . propranolol (INDERAL) 20 MG tablet Take 20 mg by mouth 2 (two) times daily.  1  . psyllium (HYDROCIL/METAMUCIL) 95 % PACK Take 1 packet by mouth daily. 240 each 0  . rosuvastatin (CRESTOR) 20 MG tablet Take 20 mg by mouth at bedtime.  1  . thiamine (VITAMIN B-1) 100 MG tablet Take 100 mg by mouth daily.    Marland Kitchen loperamide (IMODIUM) 2 MG capsule Take 2 capsules (4 mg  total) by mouth as needed for diarrhea or loose stools. 30 capsule 0  . predniSONE (DELTASONE) 50 MG tablet Take 1 tablet (50 mg total) by mouth daily with breakfast. 2 tablet 0  . traMADol (ULTRAM) 50 MG tablet TAKE 1 TABLET BY MOUTH EVERY 6 HOURS AS NEEDED (Patient not taking: Reported on 05/14/2020) 30 tablet 1  . zinc sulfate 220 (50 Zn) MG capsule Take 1 capsule (220 mg total) by mouth daily. 30 capsule 0   No facility-administered medications prior to visit.    PAST MEDICAL HISTORY: Past Medical History:  Diagnosis  Date  . Abnormality of gait 09/10/2016  . Anemia   . Anterior cerebral aneurysm 08/06/2017  . Asthma due to seasonal allergies   . Cervical cancer (HCC)    dx at age 40  . Chronic insomnia   . COPD (chronic obstructive pulmonary disease) ([place])   . Fibromyalgia   . GERD (gastroesophageal reflux disease)   . Headache    "everyday" - for years   . History of kidney stones    passed  . Hypertension   . Memory disorder 04/08/2014  . Pneumonia   . Seizure ([place])   . Somnambulism   . Stomach ulcer   . TIA (transient ischemic attack)    x 6  . Tremor   . Vitamin B12 deficiency     PAST SURGICAL HISTORY: Past Surgical History:  Procedure Laterality Date  . APPENDECTOMY     taken out with hysterectomy  . CHOLECYSTECTOMY    . COLONOSCOPY    . IR ANGIO INTRA EXTRACRAN SEL COM CAROTID INNOMINATE UNI L MOD SED  07/09/2018  . IR ANGIO INTRA EXTRACRAN SEL INTERNAL CAROTID UNI R MOD SED  07/09/2018  . IR ANGIO VERTEBRAL SEL VERTEBRAL BILAT MOD SED  07/09/2018  . IR ANGIOGRAM FOLLOW UP STUDY  07/09/2018  . IR ANGIOGRAM FOLLOW UP STUDY  07/09/2018  . IR ANGIOGRAM FOLLOW UP STUDY  07/09/2018  . IR ANGIOGRAM FOLLOW UP STUDY  07/09/2018  . IR ANGIOGRAM FOLLOW UP STUDY  07/09/2018  . IR ANGIOGRAM FOLLOW UP STUDY  07/09/2018  . IR NEURO EACH ADD'L AFTER BASIC UNI RIGHT (MS)  07/09/2018  . IR RADIOLOGIST EVAL & MGMT  08/08/2017  . IR TRANSCATH/EMBOLIZ  07/09/2018  .  RADIOLOGY WITH ANESTHESIA N/A 07/09/2018   Procedure: RADIOLOGY WITH ANESTHESIA  EMBOLIZATION;  Surgeon: Luanne Bras, MD;  Location: [facility];  Service: Radiology;  Laterality: N/A;  . SALPINGOOPHORECTOMY     age 52 for cervical cancer  . TOTAL ABDOMINAL HYSTERECTOMY     age 3 for cervical cancer    FAMILY HISTORY: Family History  Problem Relation Age of Onset  . Cancer Mother 60       breast  . Cancer Maternal Aunt 71       breast  . Cancer Maternal Uncle 8       colon  . Cancer Maternal Grandmother 65       breast  . Stroke Maternal Grandmother   . Cancer Maternal Aunt 40       unknown type of cancer  . Cancer Maternal Uncle 68       GI cancer (? pancreatic)  . Cancer Maternal Aunt 27       breast  . Cancer Father   . Hypertension Sister   . Hypertension Brother   . Cancer Maternal Grandfather 65       throat - smoker  . Cancer Cousin 22       breast  . Cancer Cousin 19       breast    SOCIAL HISTORY: Social History   Socioeconomic History  . Marital status: Married    Spouse name: Not on file  . Number of children: 1  . Years of education: GED  . Highest education level: Not on file  Occupational History  . Occupation: Financial controller: [employer]  Tobacco Use  . Smoking status: Former Smoker    Packs/day: 1.00    Years: 28.00    Pack years: 28.00  Types: Cigarettes    Quit date: 06/19/2014    Years since quitting: 6.2  . Smokeless tobacco: Never Used  Vaping Use  . Vaping Use: Every day  . Substances: Nicotine  Substance and Sexual Activity  . Alcohol use: Not Currently    Alcohol/week: 0.0 standard drinks  . Drug use: No  . Sexual activity: Not on file  Other Topics Concern  . Not on file  Social History Narrative   Patient is right handed.   Patient does not drink caffeine.   Social Determinants of Health   Financial Resource Strain: Not on file  Food Insecurity: Not on file  Transportation Needs: Not on file   Physical Activity: Not on file  Stress: Not on file  Social Connections: Not on file  Intimate Partner Violence: Not on file   PHYSICAL EXAM  Vitals:   09/13/20 0816  BP: 128/72  Pulse: 76  Weight: 103 lb (46.7 kg)  Height: 5\' 6"  (1.676 m)   Body mass index is 16.62 kg/m.  Generalized: Well developed, in no acute distress, frail appearing Neurological examination  Mentation: Alert oriented to time, place, history taking. Follows all commands speech and language fluent Cranial nerve II-XII: Pupils were equal round reactive to light. Extraocular movements were full, visual field were full on confrontational test. Facial sensation and strength were normal. Head turning and shoulder shrug  were normal and symmetric. Motor: Good strength all extremities, mild weakness to left arm and leg is giveaway weakness  Sensory: Sensory testing is intact to soft touch on all 4 extremities. No evidence of extinction is noted.  Coordination: Cerebellar testing reveals good finger-nose-finger and heel-to-shin bilaterally.  Gait and station: Wide-based, cautious, slow, can walk with examiner assistance in the room, uses a cane in the hallway Reflexes: Deep tendon reflexes are symmetric  DIAGNOSTIC DATA (LABS, IMAGING, TESTING) - I reviewed patient records, labs, notes, testing and imaging myself where available.  Lab Results  Component Value Date   WBC 8.5 05/22/2020   HGB 11.0 (L) 05/22/2020   HCT 35.8 (L) 05/22/2020   MCV 108.2 (H) 05/22/2020   PLT 382 05/22/2020      Component Value Date/Time   NA 135 05/22/2020 0446   NA 138 10/16/2018 1208   K 3.8 05/22/2020 0446   CL 98 05/22/2020 0446   CO2 27 05/22/2020 0446   GLUCOSE 85 05/22/2020 0446   BUN 12 05/22/2020 0446   BUN 17 10/16/2018 1208   CREATININE 0.58 05/22/2020 0446   CALCIUM 9.5 05/22/2020 0446   PROT 6.3 (L) 05/22/2020 0446   PROT 6.6 10/16/2018 1208   ALBUMIN 2.9 (L) 05/22/2020 0446   ALBUMIN 4.5 10/16/2018 1208    AST 31 05/22/2020 0446   ALT 14 05/22/2020 0446   ALKPHOS 71 05/22/2020 0446   BILITOT 0.6 05/22/2020 0446   BILITOT 0.2 10/16/2018 1208   GFRNONAA >60 05/22/2020 0446   GFRAA >60 05/22/2020 0446   Lab Results  Component Value Date   CHOL 125 05/18/2020   HDL 52 05/18/2020   LDLCALC 62 05/18/2020   TRIG 53 05/18/2020   CHOLHDL 2.4 05/18/2020   No results found for: HGBA1C Lab Results  Component Value Date   VITAMINB12 1,186 (H) 05/19/2020   Lab Results  Component Value Date   TSH 0.885 05/16/2020   ASSESSMENT AND PLAN 60 y.o. year old female  has a past medical history of Abnormality of gait (09/10/2016), Anemia, Anterior cerebral aneurysm (08/06/2017), Asthma due to seasonal allergies,  Cervical cancer ([place]), Chronic insomnia, COPD (chronic obstructive pulmonary disease) (HCC), Fibromyalgia, GERD (gastroesophageal reflux disease), Headache, History of kidney stones, Hypertension, Memory disorder (04/08/2014), Pneumonia, Seizure ([place]), Somnambulism, Stomach ulcer, TIA (transient ischemic attack), Tremor, and Vitamin B12 deficiency. here with:  1.  Left inferior scapular pain -Seeing pain management on Monday -Multiple scans, no etiology determined  2.  Gait disturbance -Episodes of falling have improved  -MRI cervical spine in August 2021 showed arthritis, disc degenerative changes, no compression of the spinal cord that would affect gait  3.  Syncopal episodes -Related to low blood pressure -Recent increase in midodrine from PCP this week 2.5 mg 3 times daily -If spells continue, dose may need to be increased -Recommend staying well-hydrated with water, even consider compression stockings  4.  History of cerebral aneurysm -Stable, CT head in September 2021 showed no acute abnormalities  5.  History of seizures -No recurrent seizures -On Lamictal 50 mg twice a day -On Keppra 500 mg twice a day, more for pain control  6.  Fibromyalgia -Seeing pain management -On  low-dose gabapentin -Amitriptyline was discontinued due to hallucinations when hospitalized with Covid  She will follow-up in 4 months with Dr. Jannifer Franklin or sooner if needed. At this point all her medications are coming from her primary care doctor.  I spent 30 minutes of face-to-face and non-face-to-face time with patient.  This included previsit chart review, lab review, study review, order entry, electronic health record documentation, patient education.  Butler Denmark, AGNP-C, DNP 09/13/2020, 8:56 AM [facility] [address] [place], [place] [address] [phone]

## 2020-09-13 NOTE — Patient Instructions (Signed)
Continue current medications If spells keep happening, you may need to increase midodrine  Medication was just increased See you back in 4 months

## 2020-09-18 DIAGNOSIS — M7918 Myalgia, other site: Secondary | ICD-10-CM | POA: Diagnosis not present

## 2020-09-18 DIAGNOSIS — Z79891 Long term (current) use of opiate analgesic: Secondary | ICD-10-CM | POA: Diagnosis not present

## 2020-09-18 DIAGNOSIS — G8929 Other chronic pain: Secondary | ICD-10-CM | POA: Diagnosis not present

## 2020-09-18 DIAGNOSIS — M546 Pain in thoracic spine: Secondary | ICD-10-CM | POA: Diagnosis not present

## 2020-09-18 DIAGNOSIS — Z5181 Encounter for therapeutic drug level monitoring: Secondary | ICD-10-CM | POA: Diagnosis not present

## 2020-09-18 DIAGNOSIS — M797 Fibromyalgia: Secondary | ICD-10-CM | POA: Diagnosis not present

## 2020-09-20 DIAGNOSIS — Z681 Body mass index (BMI) 19 or less, adult: Secondary | ICD-10-CM | POA: Diagnosis not present

## 2020-09-20 DIAGNOSIS — M5451 Vertebrogenic low back pain: Secondary | ICD-10-CM | POA: Diagnosis not present

## 2020-09-24 ENCOUNTER — Other Ambulatory Visit: Payer: Self-pay | Admitting: Neurology

## 2020-10-09 DIAGNOSIS — R571 Hypovolemic shock: Secondary | ICD-10-CM | POA: Diagnosis not present

## 2020-10-09 DIAGNOSIS — J449 Chronic obstructive pulmonary disease, unspecified: Secondary | ICD-10-CM | POA: Diagnosis not present

## 2020-10-09 DIAGNOSIS — E785 Hyperlipidemia, unspecified: Secondary | ICD-10-CM | POA: Diagnosis not present

## 2020-10-09 DIAGNOSIS — G8929 Other chronic pain: Secondary | ICD-10-CM | POA: Diagnosis not present

## 2020-10-09 DIAGNOSIS — Z20822 Contact with and (suspected) exposure to covid-19: Secondary | ICD-10-CM | POA: Diagnosis not present

## 2020-10-09 DIAGNOSIS — R509 Fever, unspecified: Secondary | ICD-10-CM | POA: Diagnosis not present

## 2020-10-09 DIAGNOSIS — S0990XA Unspecified injury of head, initial encounter: Secondary | ICD-10-CM | POA: Diagnosis not present

## 2020-10-09 DIAGNOSIS — N179 Acute kidney failure, unspecified: Secondary | ICD-10-CM | POA: Diagnosis not present

## 2020-10-09 DIAGNOSIS — W19XXXA Unspecified fall, initial encounter: Secondary | ICD-10-CM | POA: Diagnosis not present

## 2020-10-09 DIAGNOSIS — I10 Essential (primary) hypertension: Secondary | ICD-10-CM | POA: Diagnosis not present

## 2020-10-09 DIAGNOSIS — R112 Nausea with vomiting, unspecified: Secondary | ICD-10-CM | POA: Diagnosis not present

## 2020-10-09 DIAGNOSIS — M797 Fibromyalgia: Secondary | ICD-10-CM | POA: Diagnosis not present

## 2020-10-09 DIAGNOSIS — E860 Dehydration: Secondary | ICD-10-CM | POA: Diagnosis not present

## 2020-10-09 DIAGNOSIS — M549 Dorsalgia, unspecified: Secondary | ICD-10-CM | POA: Diagnosis not present

## 2020-10-09 DIAGNOSIS — R0902 Hypoxemia: Secondary | ICD-10-CM | POA: Diagnosis not present

## 2020-10-09 DIAGNOSIS — R918 Other nonspecific abnormal finding of lung field: Secondary | ICD-10-CM | POA: Diagnosis not present

## 2020-10-09 DIAGNOSIS — E46 Unspecified protein-calorie malnutrition: Secondary | ICD-10-CM | POA: Diagnosis not present

## 2020-10-09 DIAGNOSIS — E43 Unspecified severe protein-calorie malnutrition: Secondary | ICD-10-CM | POA: Diagnosis not present

## 2020-10-09 DIAGNOSIS — R140 Abdominal distension (gaseous): Secondary | ICD-10-CM | POA: Diagnosis not present

## 2020-10-09 DIAGNOSIS — J9811 Atelectasis: Secondary | ICD-10-CM | POA: Diagnosis not present

## 2020-10-09 DIAGNOSIS — R1310 Dysphagia, unspecified: Secondary | ICD-10-CM | POA: Diagnosis not present

## 2020-10-09 DIAGNOSIS — G9341 Metabolic encephalopathy: Secondary | ICD-10-CM | POA: Diagnosis not present

## 2020-10-09 DIAGNOSIS — E878 Other disorders of electrolyte and fluid balance, not elsewhere classified: Secondary | ICD-10-CM | POA: Diagnosis not present

## 2020-10-09 DIAGNOSIS — I700 Atherosclerosis of aorta: Secondary | ICD-10-CM | POA: Diagnosis not present

## 2020-10-09 DIAGNOSIS — R531 Weakness: Secondary | ICD-10-CM | POA: Diagnosis not present

## 2020-10-09 DIAGNOSIS — D539 Nutritional anemia, unspecified: Secondary | ICD-10-CM | POA: Diagnosis not present

## 2020-10-09 DIAGNOSIS — E876 Hypokalemia: Secondary | ICD-10-CM | POA: Diagnosis not present

## 2020-10-09 DIAGNOSIS — Z681 Body mass index (BMI) 19 or less, adult: Secondary | ICD-10-CM | POA: Diagnosis not present

## 2020-10-09 DIAGNOSIS — J9601 Acute respiratory failure with hypoxia: Secondary | ICD-10-CM | POA: Diagnosis not present

## 2020-10-09 DIAGNOSIS — I959 Hypotension, unspecified: Secondary | ICD-10-CM | POA: Diagnosis not present

## 2020-10-09 DIAGNOSIS — R5383 Other fatigue: Secondary | ICD-10-CM | POA: Diagnosis not present

## 2020-10-09 DIAGNOSIS — S0083XA Contusion of other part of head, initial encounter: Secondary | ICD-10-CM | POA: Diagnosis not present

## 2020-10-09 DIAGNOSIS — R627 Adult failure to thrive: Secondary | ICD-10-CM | POA: Diagnosis not present

## 2020-10-09 DIAGNOSIS — J90 Pleural effusion, not elsewhere classified: Secondary | ICD-10-CM | POA: Diagnosis not present

## 2020-10-09 DIAGNOSIS — Z792 Long term (current) use of antibiotics: Secondary | ICD-10-CM | POA: Diagnosis not present

## 2020-10-10 DIAGNOSIS — J9811 Atelectasis: Secondary | ICD-10-CM | POA: Diagnosis not present

## 2020-10-14 DIAGNOSIS — R140 Abdominal distension (gaseous): Secondary | ICD-10-CM | POA: Diagnosis not present

## 2020-10-15 DIAGNOSIS — R918 Other nonspecific abnormal finding of lung field: Secondary | ICD-10-CM | POA: Diagnosis not present

## 2020-10-17 ENCOUNTER — Inpatient Hospital Stay
Admission: AD | Admit: 2020-10-17 | Payer: Medicare Other | Source: Other Acute Inpatient Hospital | Admitting: Pulmonary Disease

## 2020-10-17 ENCOUNTER — Telehealth: Payer: Self-pay | Admitting: Emergency Medicine

## 2020-10-17 DIAGNOSIS — R0902 Hypoxemia: Secondary | ICD-10-CM | POA: Diagnosis not present

## 2020-10-17 DIAGNOSIS — J90 Pleural effusion, not elsewhere classified: Secondary | ICD-10-CM | POA: Diagnosis not present

## 2020-11-17 DEATH — deceased

## 2021-01-03 ENCOUNTER — Telehealth: Payer: Self-pay | Admitting: Neurology

## 2021-01-03 NOTE — Telephone Encounter (Signed)
Events noted

## 2021-01-03 NOTE — Telephone Encounter (Signed)
Pt's husband called to cx pt's appt this month due to the pt passing away.

## 2021-01-11 ENCOUNTER — Ambulatory Visit: Payer: Medicare Other | Admitting: Neurology

## 2021-09-14 IMAGING — DX DG CHEST 1V PORT
1 series · 1 of 1 positions shown · non-contrast
Comparison: 11/09/2019

CLINICAL DATA: Cough

EXAM:
PORTABLE CHEST 1 VIEW

[chest ap]
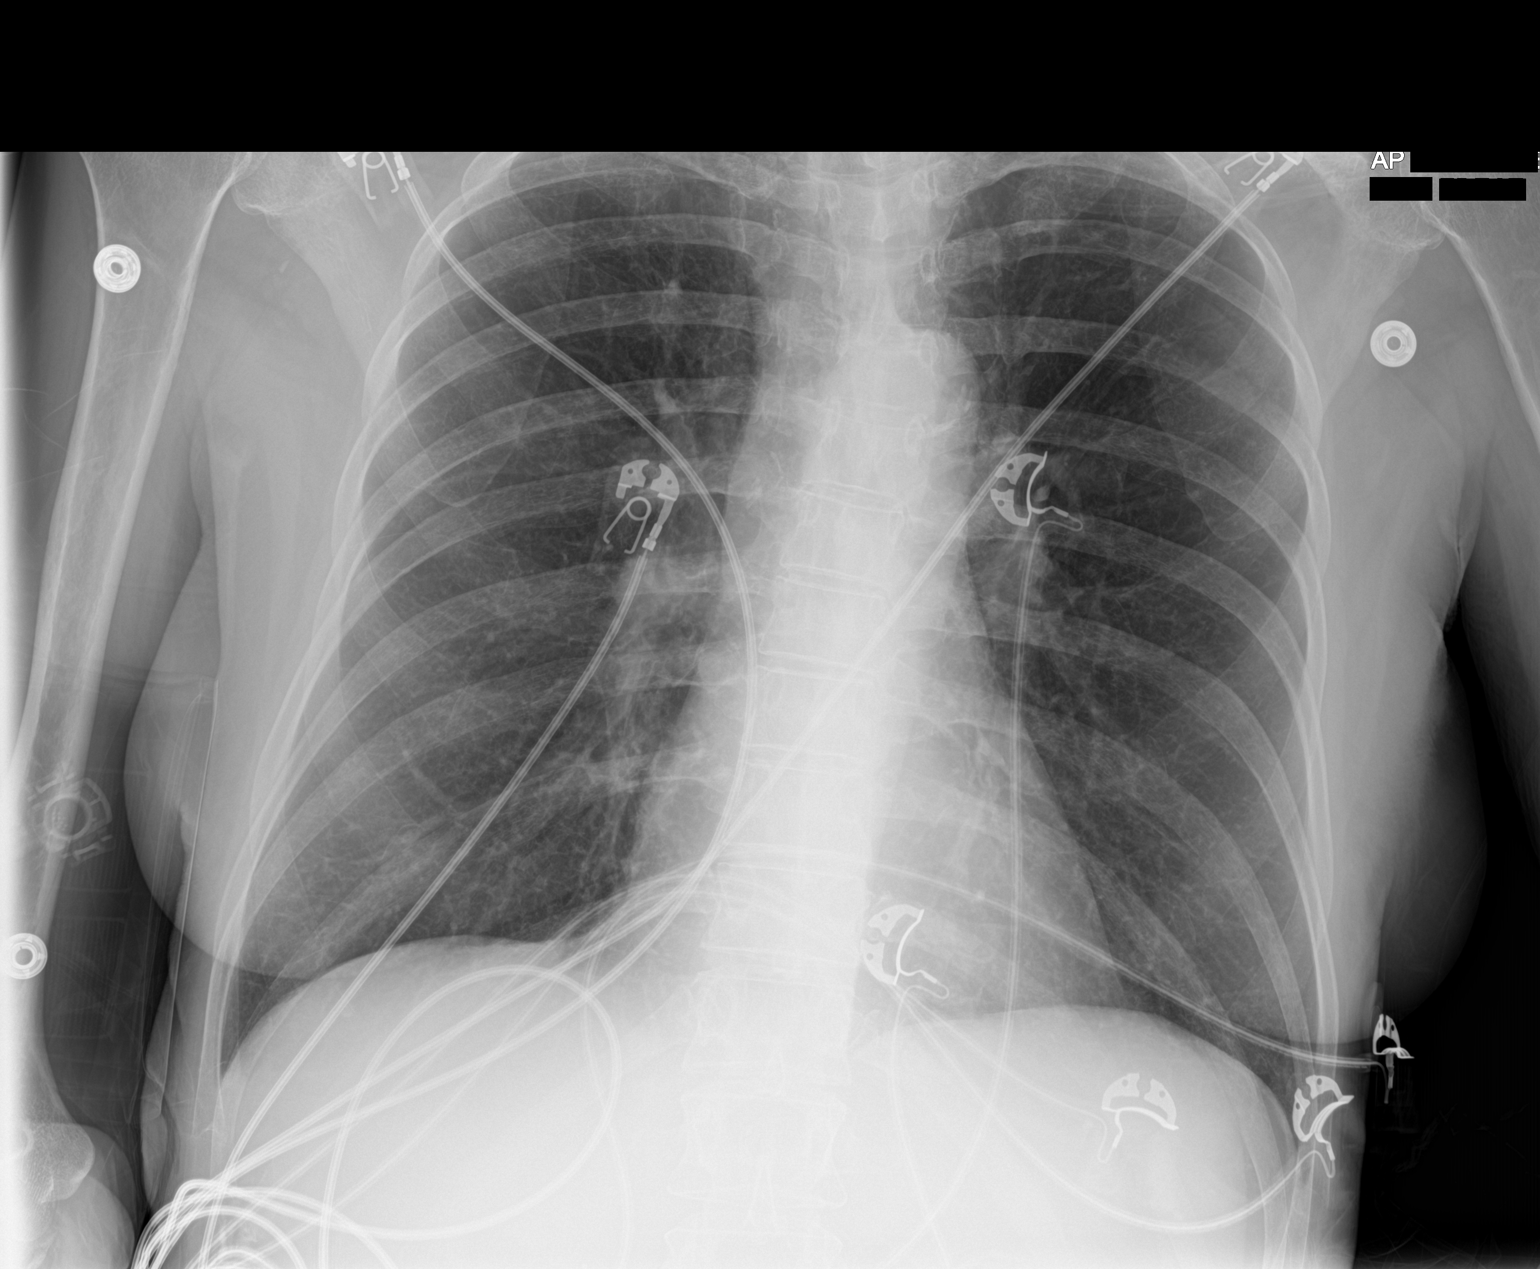

[1 of 1 positions shown; findings below may reference images not displayed]

FINDINGS: Artifact from EKG leads.

Normal heart size and mediastinal contours. No acute infiltrate or
edema. No effusion or pneumothorax. No acute osseous findings.
IMPRESSION: No active disease.

## 2021-09-16 IMAGING — DX DG CHEST 1V PORT
1 series · 1 of 1 positions shown · non-contrast
Comparison: 05/14/2020.

CLINICAL DATA: Confusion.  COVID positive.

EXAM:
PORTABLE CHEST 1 VIEW

[chest ap]
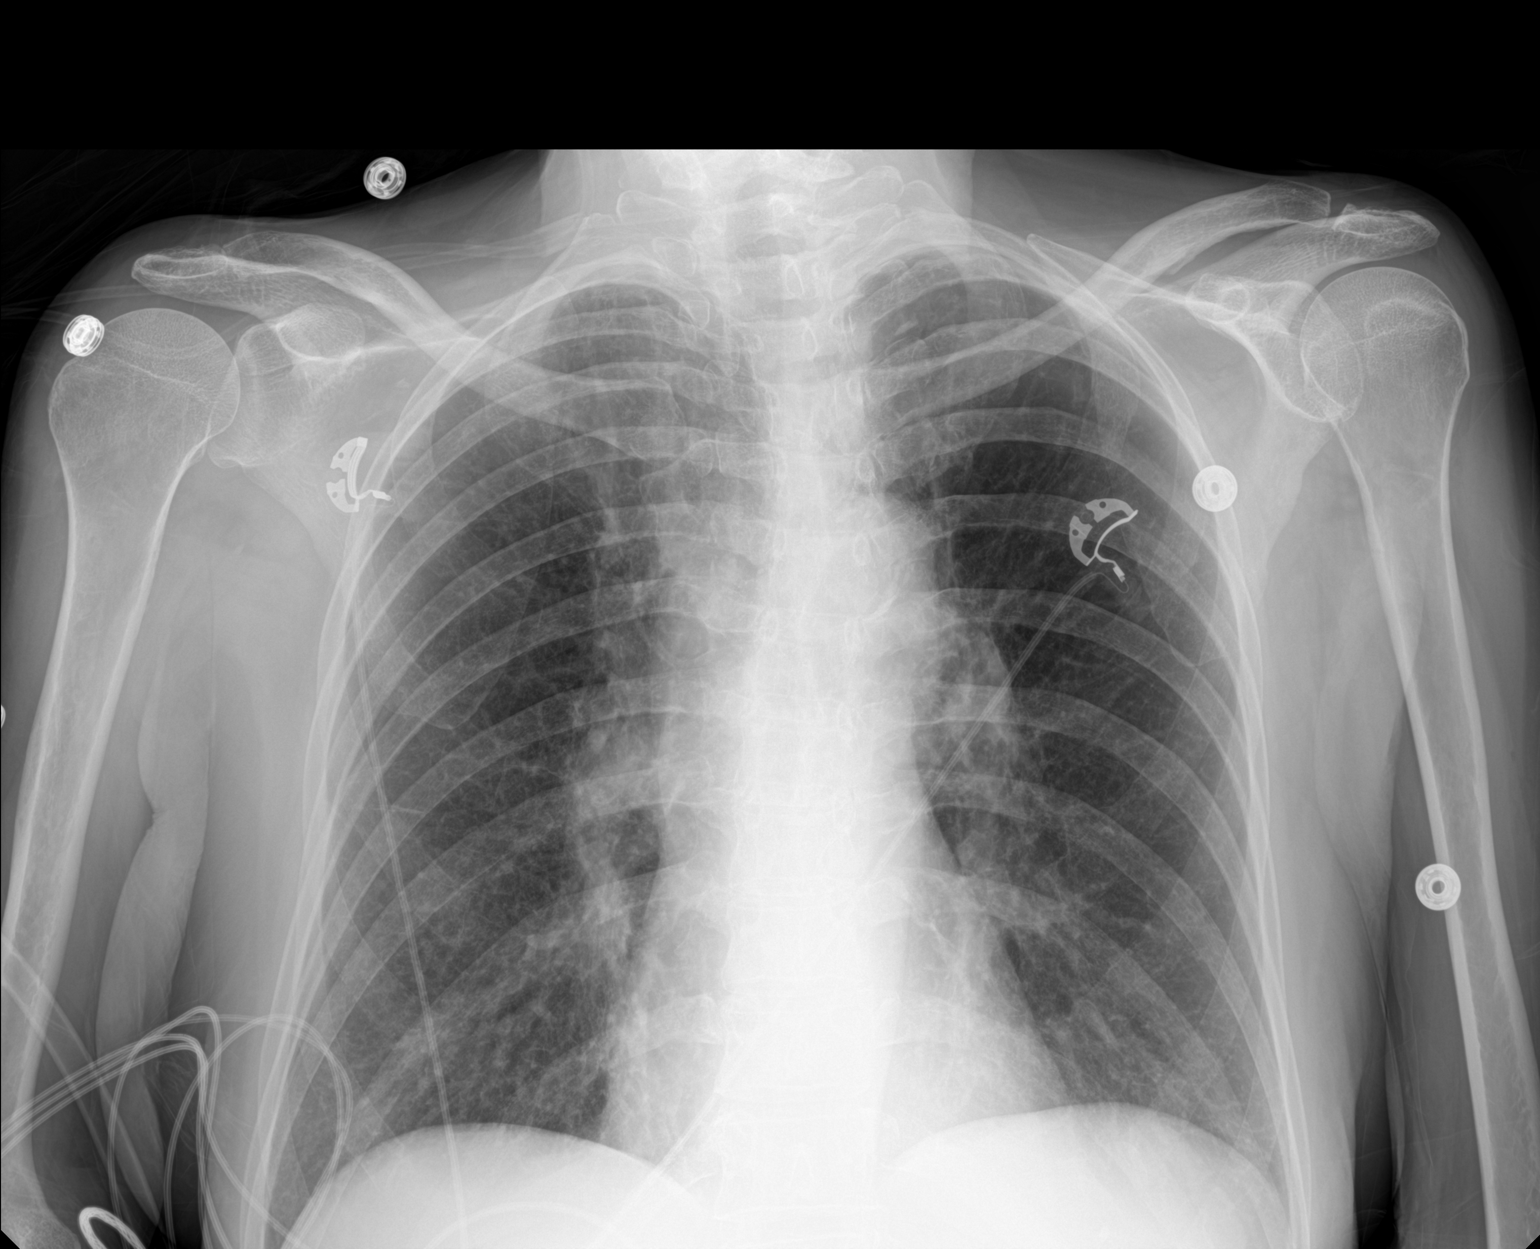

[1 of 1 positions shown; findings below may reference images not displayed]

FINDINGS: Mediastinum and hilar structures normal. Heart size normal. No focal
infiltrate. No pleural effusion or pneumothorax.
IMPRESSION: No acute cardiopulmonary disease.

## 2024-02-13 ENCOUNTER — Encounter (HOSPITAL_COMMUNITY): Payer: Self-pay | Admitting: Interventional Radiology
